# Patient Record
Sex: Male | Born: 1945 | Race: White | Hispanic: No | Marital: Married | Smoking: Never smoker
Health system: Southern US, Community
[De-identification: ages and names within clinical notes are randomized; demographics above are authoritative.]

## PROBLEM LIST (undated history)

## (undated) DIAGNOSIS — M48061 Spinal stenosis, lumbar region without neurogenic claudication: Secondary | ICD-10-CM

## (undated) DIAGNOSIS — G8929 Other chronic pain: Secondary | ICD-10-CM

## (undated) DIAGNOSIS — N400 Enlarged prostate without lower urinary tract symptoms: Secondary | ICD-10-CM

## (undated) DIAGNOSIS — E559 Vitamin D deficiency, unspecified: Secondary | ICD-10-CM

## (undated) DIAGNOSIS — M810 Age-related osteoporosis without current pathological fracture: Secondary | ICD-10-CM

## (undated) DIAGNOSIS — R29898 Other symptoms and signs involving the musculoskeletal system: Secondary | ICD-10-CM

## (undated) HISTORY — DX: Vitamin D deficiency, unspecified: E55.9

## (undated) HISTORY — PX: CARPAL TUNNEL RELEASE: SHX101

## (undated) HISTORY — PX: BACK SURGERY: SHX140

## (undated) HISTORY — PX: OTHER SURGICAL HISTORY: SHX169

## (undated) HISTORY — DX: Age-related osteoporosis without current pathological fracture: M81.0

---

## 2009-04-01 HISTORY — PX: BACK SURGERY: SHX140

## 2019-03-02 ENCOUNTER — Encounter: Payer: Self-pay | Admitting: "Endocrinology

## 2019-03-02 LAB — HEMOGLOBIN A1C: Hemoglobin A1C: 5.9

## 2019-06-25 ENCOUNTER — Encounter: Payer: Self-pay | Admitting: "Endocrinology

## 2019-06-25 LAB — VITAMIN B12: Vitamin B-12: 453

## 2019-06-25 LAB — TSH: TSH: 2.3 (ref 0.41–5.90)

## 2019-07-13 LAB — VITAMIN D 25 HYDROXY (VIT D DEFICIENCY, FRACTURES): Vit D, 25-Hydroxy: 24.2

## 2019-08-13 ENCOUNTER — Encounter: Payer: Self-pay | Admitting: "Endocrinology

## 2019-08-13 ENCOUNTER — Other Ambulatory Visit: Payer: Self-pay

## 2019-08-13 ENCOUNTER — Ambulatory Visit (INDEPENDENT_AMBULATORY_CARE_PROVIDER_SITE_OTHER): Payer: Non-veteran care | Admitting: "Endocrinology

## 2019-08-13 VITALS — BP 136/81 | HR 54 | Ht 71.0 in | Wt 304.0 lb

## 2019-08-13 DIAGNOSIS — E559 Vitamin D deficiency, unspecified: Secondary | ICD-10-CM

## 2019-08-13 DIAGNOSIS — R7303 Prediabetes: Secondary | ICD-10-CM

## 2019-08-13 DIAGNOSIS — Z789 Other specified health status: Secondary | ICD-10-CM

## 2019-08-13 NOTE — Patient Instructions (Signed)

## 2019-08-13 NOTE — Progress Notes (Signed)
Endocrinology Consult Note                                            08/13/2019, 10:44 AM   Subjective:    Patient ID: Tony Mcintosh, male    DOB: Jan 30, 1946, PCP [facility]   Past Medical History:  Diagnosis Date  . Osteoporosis   . Vitamin D deficiency    Past Surgical History:  Procedure Laterality Date  . CARPAL TUNNEL RELEASE    . lumbar back surger     Social History   Socioeconomic History  . Marital status: Married    Spouse name: Not on file  . Number of children: Not on file  . Years of education: Not on file  . Highest education level: Not on file  Occupational History  . Not on file  Tobacco Use  . Smoking status: Never Smoker  Substance and Sexual Activity  . Alcohol use: Yes    Comment: 1 beer/wk  . Drug use: Not on file  . Sexual activity: Not on file  Other Topics Concern  . Not on file  Social History Narrative  . Not on file   Social Determinants of Health   Financial Resource Strain:   . Difficulty of Paying Living Expenses:   Food Insecurity:   . Worried About Programme researcher, broadcasting/film/video in the Last Year:   . Barista in the Last Year:   Transportation Needs:   . Freight forwarder (Medical):   Marland Kitchen Lack of Transportation (Non-Medical):   Physical Activity:   . Days of Exercise per Week:   . Minutes of Exercise per Session:   Stress:   . Feeling of Stress :   Social Connections:   . Frequency of Communication with Friends and Family:   . Frequency of Social Gatherings with Friends and Family:   . Attends Religious Services:   . Active Member of Clubs or Organizations:   . Attends Banker Meetings:   Marland Kitchen Marital Status:    Family History  Problem Relation Age of Onset  . Stroke Father    Outpatient Encounter Medications as of 08/13/2019  Medication Sig  . b complex vitamins capsule Take 1 capsule by mouth 2 (two) times daily.  . calcium carbonate (TUMS - DOSED IN MG ELEMENTAL CALCIUM) 500  MG chewable tablet Chew 2 tablets by mouth daily.  . Cholecalciferol (VITAMIN D3) 25 MCG (1000 UT) CAPS Take 1 capsule by mouth daily.  Marland Kitchen gabapentin (NEURONTIN) 300 MG capsule Take 300 mg by mouth daily.  Marland Kitchen HYDROcodone-acetaminophen (NORCO/VICODIN) 5-325 MG tablet Take 1 tablet by mouth every 6 (six) hours as needed for moderate pain.  Marland Kitchen oxybutynin (DITROPAN-XL) 10 MG 24 hr tablet Take 10 mg by mouth at bedtime.  . Cholecalciferol (VITAMIN D3) 1.25 MG (50000 UT) CAPS Take 1 capsule by mouth once a week.  . folic acid (FOLVITE) 1 MG tablet Take 1 mg by mouth daily.  . methotrexate (RHEUMATREX) 2.5 MG tablet Take 7 tablets by mouth once a week.  . naproxen (NAPROSYN) 500 MG tablet Take 500 mg by mouth 2 (two) times daily.  . pramipexole (MIRAPEX) 1 MG tablet Take 1 tablet by mouth 2 (two) times daily.  . predniSONE (DELTASONE) 5 MG tablet Take 5 mg by mouth daily.  . tamsulosin (FLOMAX)  0.4 MG CAPS capsule Take 0.4 mg by mouth daily.   No facility-administered encounter medications on file as of 08/13/2019.   ALLERGIES: No Known Allergies  VACCINATION STATUS:  There is no immunization history on file for this patient.  HPI Tony Mcintosh is 74 y.o. male who presents today with a medical history as above. -History is obtained directly from the patient.  Patient denies any prior history of testosterone deficiency, never required testosterone replacement therapy. -He had recent routine labs on his rheumatologist showed normal total testosterone of 318, slightly suppressed free testosterone 6.2; as well as a low vitamin D of 24.  He did have history of vitamin D deficiency before which required intermittent supplement.   -He has 2 grown kids who he fathered biologically. -His primary care is at the [place] [facility] in [place], consult comes in for his rheumatologist Dr. Scarlette Shorts- for hypothyroidism and vitamin D deficiency. -Patient has history of BPH currently on tamsulosin.  He also has sleep  apnea on CPAP. -His main complaint seems to be inadequate sleep.  He denies hot flashes.  He is on multiple medications including hydrocodone, oxybutynin, gabapentin, pramipexole, methotrexate, prednisone, tamsulosin, naproxen. -He denies any prior history of testicular injury, chemotherapy, no radiation.  His medical history is also significant osteopenia.  Review of Systems  Constitutional: + Progressive weight gain/inability to lose weight, no fatigue, no subjective hyperthermia, no subjective hypothermia Eyes: no blurry vision, no xerophthalmia ENT: no sore throat, no nodules palpated in throat, no dysphagia/odynophagia, no hoarseness Cardiovascular: no Chest Pain, no Shortness of Breath, no palpitations, no leg swelling Respiratory: no cough, no shortness of breath Gastrointestinal: no Nausea/Vomiting/Diarhhea Musculoskeletal: no muscle/joint aches Skin: no rashes Neurological: no tremors, no numbness, no tingling, no dizziness Psychiatric: no depression, no anxiety  Objective:    Vitals with BMI 08/13/2019  Height 5\' 11"   Weight 304 lbs  BMI 85.46  Systolic 270  Diastolic 81  Pulse 54    BP 136/81   Pulse (!) 54   Ht 5\' 11"  (1.803 m)   Wt (!) 304 lb (137.9 kg)   BMI 42.40 kg/m   Wt Readings from Last 3 Encounters:  08/13/19 (!) 304 lb (137.9 kg)    Physical Exam  Constitutional:  Body mass index is 42.4 kg/m.,  not in acute distress, normal state of mind Eyes: PERRLA, EOMI, no exophthalmos ENT: moist mucous membranes, no gross thyromegaly, no gross cervical lymphadenopathy  Respiratory:  adequate breathing efforts, no gross chest deformity  Musculoskeletal: + Walks with a cane due to back injuries, strength intact in all four extremities Skin: moist, warm, no rashes Neurological: no tremor with outstretched hands, Deep tendon reflexes normal in bilateral lower extremities.   Referral package shows labs from June 25, 2019: Total testosterone 318- normal, free  testosterone 6.2 (normal 6.6-18.1) Vitamin D -24.2, his vitamin D was 51.8 in December 2020. [facility] 5.9% from December 2020.  Recent Results (from the past 2160 hour(s))  Vitamin B12     Status: None   Collection Time: 06/25/19 12:00 AM  Result Value Ref Range   Vitamin B-12 453   TSH     Status: None   Collection Time: 06/25/19 12:00 AM  Result Value Ref Range   TSH 2.30 0.41 - 5.90    Comment:  free t4 1.26  VITAMIN D 25 Hydroxy (Vit-D Deficiency, Fractures)     Status: None   Collection Time: 07/13/19 12:00 AM  Result Value Ref Range  Vit D, 25-Hydroxy 24.2     Assessment & Plan:   1. Vitamin D deficiency 2. Educated about management of weight 3.  Prediabetes   - Dashawn Bartnick  is being seen at a kind request of [facility]. - I have reviewed his available endocrine records and clinically evaluated the patient. - Based on these reviews, he does not have hypogonadism given his adequate total testosterone of 318.  Free testosterone which only constitutes 2% of his testosterone reserve if not a target of treatment and not diabetes for diagnosis of hypogonadism.    -I had a long discussion with him about physiology of testosterone, safe use of testosterone and potential complications of unnecessary testosterone supplement or replacement.   -Given his history of BPH, sleep apnea, he is not a suitable candidate for testosterone replacement-risk outweighs benefit in his case.  -He may need total testosterone measurement once a year, and sent back if total testosterone for his age is lower than 175 mg per DL.  -Regarding his vitamin D deficiency: His on adequate replacement regimen with 50,000 units of vitamin D2 weekly as well as maintenance dose of vitamin D3 daily.  He has to be continued on 50,000 units weekly for at least 8 more weeks.  -Regarding his prediabetes with A1c of 5.9 and associated obesity with BMI of 42.4: -He would  benefit the most from weight loss.  - he  admits there is a room for improvement in his diet and drink choices. -  Suggestion is made for him to avoid simple carbohydrates  from his diet including Cakes, Sweet Desserts / Pastries, Ice Cream, Soda (diet and regular), Sweet Tea, Candies, Chips, Cookies, Sweet Pastries,  Store Bought Juices, Alcohol in Excess of  1-2 drinks a day, Artificial Sweeteners, Coffee Creamer, and "Sugar-free" Products. This will help patient to have stable blood glucose profile and potentially avoid unintended weight gain.   - I did not initiate any new prescriptions today. - he is advised to maintain close follow up with [facility] for primary care needs.   - Time spent with the patient: 60 minutes, of which >50% was spent in  counseling him about his prediabetes, vitamin D deficiency, discussing about testosterone physiology, obesity and the rest in obtaining information about his symptoms, reviewing his previous labs/studies ( including abstractions from other facilities),  evaluations, and treatments,  and developing a plan to confirm diagnosis and long term treatment based on the latest standards of care/guidelines; and documenting his care.  Tony Mcintosh participated in the discussions, expressed understanding, and voiced agreement with the above plans.  All questions were answered to his satisfaction. he is encouraged to contact clinic should he have any questions or concerns prior to his return visit.  Follow up plan: Return if symptoms worsen or fail to improve.   Marquis Lunch, MD [facility] [address] [place], [place] [address] Phone: [phone]  Fax: [phone]     08/13/2019, 10:44 AM  This note was partially dictated with voice recognition software. Similar sounding words can be transcribed inadequately or may not  be corrected upon review.

## 2021-04-11 ENCOUNTER — Emergency Department (HOSPITAL_COMMUNITY): Payer: No Typology Code available for payment source

## 2021-04-11 ENCOUNTER — Other Ambulatory Visit: Payer: Self-pay

## 2021-04-11 ENCOUNTER — Encounter (HOSPITAL_COMMUNITY): Payer: Self-pay | Admitting: Emergency Medicine

## 2021-04-11 ENCOUNTER — Inpatient Hospital Stay (HOSPITAL_COMMUNITY)
Admission: EM | Admit: 2021-04-11 | Discharge: 2021-04-20 | DRG: 095 | Disposition: A | Discharge status: Rehabilitation Facility | Payer: No Typology Code available for payment source | Attending: Family Medicine | Admitting: Family Medicine

## 2021-04-11 DIAGNOSIS — M5124 Other intervertebral disc displacement, thoracic region: Secondary | ICD-10-CM

## 2021-04-11 DIAGNOSIS — M62838 Other muscle spasm: Secondary | ICD-10-CM | POA: Diagnosis present

## 2021-04-11 DIAGNOSIS — R29898 Other symptoms and signs involving the musculoskeletal system: Secondary | ICD-10-CM | POA: Diagnosis present

## 2021-04-11 DIAGNOSIS — D72829 Elevated white blood cell count, unspecified: Secondary | ICD-10-CM | POA: Diagnosis present

## 2021-04-11 DIAGNOSIS — M4804 Spinal stenosis, thoracic region: Secondary | ICD-10-CM | POA: Diagnosis present

## 2021-04-11 DIAGNOSIS — E559 Vitamin D deficiency, unspecified: Secondary | ICD-10-CM | POA: Diagnosis present

## 2021-04-11 DIAGNOSIS — R197 Diarrhea, unspecified: Secondary | ICD-10-CM | POA: Diagnosis present

## 2021-04-11 DIAGNOSIS — M19041 Primary osteoarthritis, right hand: Secondary | ICD-10-CM | POA: Diagnosis present

## 2021-04-11 DIAGNOSIS — M48061 Spinal stenosis, lumbar region without neurogenic claudication: Secondary | ICD-10-CM | POA: Diagnosis present

## 2021-04-11 DIAGNOSIS — Z20822 Contact with and (suspected) exposure to covid-19: Secondary | ICD-10-CM | POA: Diagnosis present

## 2021-04-11 DIAGNOSIS — Z5329 Procedure and treatment not carried out because of patient's decision for other reasons: Secondary | ICD-10-CM | POA: Diagnosis present

## 2021-04-11 DIAGNOSIS — G6289 Other specified polyneuropathies: Secondary | ICD-10-CM | POA: Diagnosis not present

## 2021-04-11 DIAGNOSIS — G4733 Obstructive sleep apnea (adult) (pediatric): Secondary | ICD-10-CM | POA: Diagnosis present

## 2021-04-11 DIAGNOSIS — G629 Polyneuropathy, unspecified: Secondary | ICD-10-CM

## 2021-04-11 DIAGNOSIS — M19042 Primary osteoarthritis, left hand: Secondary | ICD-10-CM | POA: Diagnosis present

## 2021-04-11 DIAGNOSIS — G610 Guillain-Barre syndrome: Secondary | ICD-10-CM | POA: Diagnosis not present

## 2021-04-11 DIAGNOSIS — M170 Bilateral primary osteoarthritis of knee: Secondary | ICD-10-CM | POA: Diagnosis present

## 2021-04-11 DIAGNOSIS — Z823 Family history of stroke: Secondary | ICD-10-CM

## 2021-04-11 DIAGNOSIS — S63602A Unspecified sprain of left thumb, initial encounter: Secondary | ICD-10-CM | POA: Diagnosis present

## 2021-04-11 DIAGNOSIS — N400 Enlarged prostate without lower urinary tract symptoms: Secondary | ICD-10-CM | POA: Diagnosis present

## 2021-04-11 DIAGNOSIS — G8929 Other chronic pain: Secondary | ICD-10-CM | POA: Diagnosis present

## 2021-04-11 DIAGNOSIS — M4802 Spinal stenosis, cervical region: Secondary | ICD-10-CM | POA: Diagnosis present

## 2021-04-11 DIAGNOSIS — M549 Dorsalgia, unspecified: Secondary | ICD-10-CM

## 2021-04-11 DIAGNOSIS — Z7952 Long term (current) use of systemic steroids: Secondary | ICD-10-CM

## 2021-04-11 DIAGNOSIS — R200 Anesthesia of skin: Secondary | ICD-10-CM

## 2021-04-11 DIAGNOSIS — R32 Unspecified urinary incontinence: Secondary | ICD-10-CM | POA: Diagnosis present

## 2021-04-11 DIAGNOSIS — N3281 Overactive bladder: Secondary | ICD-10-CM | POA: Diagnosis present

## 2021-04-11 DIAGNOSIS — G2581 Restless legs syndrome: Secondary | ICD-10-CM | POA: Diagnosis not present

## 2021-04-11 DIAGNOSIS — E538 Deficiency of other specified B group vitamins: Secondary | ICD-10-CM | POA: Diagnosis present

## 2021-04-11 DIAGNOSIS — A0811 Acute gastroenteropathy due to Norwalk agent: Secondary | ICD-10-CM | POA: Diagnosis present

## 2021-04-11 DIAGNOSIS — X58XXXA Exposure to other specified factors, initial encounter: Secondary | ICD-10-CM | POA: Diagnosis present

## 2021-04-11 DIAGNOSIS — K5900 Constipation, unspecified: Secondary | ICD-10-CM | POA: Diagnosis not present

## 2021-04-11 DIAGNOSIS — Z79899 Other long term (current) drug therapy: Secondary | ICD-10-CM

## 2021-04-11 DIAGNOSIS — Z6841 Body Mass Index (BMI) 40.0 and over, adult: Secondary | ICD-10-CM

## 2021-04-11 DIAGNOSIS — R52 Pain, unspecified: Secondary | ICD-10-CM

## 2021-04-11 DIAGNOSIS — M4807 Spinal stenosis, lumbosacral region: Secondary | ICD-10-CM | POA: Diagnosis present

## 2021-04-11 DIAGNOSIS — D696 Thrombocytopenia, unspecified: Secondary | ICD-10-CM | POA: Diagnosis present

## 2021-04-11 DIAGNOSIS — R531 Weakness: Secondary | ICD-10-CM

## 2021-04-11 DIAGNOSIS — M25462 Effusion, left knee: Secondary | ICD-10-CM | POA: Diagnosis present

## 2021-04-11 HISTORY — DX: Benign prostatic hyperplasia without lower urinary tract symptoms: N40.0

## 2021-04-11 HISTORY — DX: Other chronic pain: G89.29

## 2021-04-11 HISTORY — DX: Spinal stenosis, lumbar region without neurogenic claudication: M48.061

## 2021-04-11 HISTORY — DX: Other symptoms and signs involving the musculoskeletal system: R29.898

## 2021-04-11 LAB — PROTIME-INR
INR: 0.9 (ref 0.8–1.2)
Prothrombin Time: 12.5 seconds (ref 11.4–15.2)

## 2021-04-11 LAB — I-STAT CHEM 8, ED
BUN: 20 mg/dL (ref 8–23)
Calcium, Ion: 1.11 mmol/L — ABNORMAL LOW (ref 1.15–1.40)
Chloride: 101 mmol/L (ref 98–111)
Creatinine, Ser: 0.9 mg/dL (ref 0.61–1.24)
Glucose, Bld: 97 mg/dL (ref 70–99)
HCT: 47 % (ref 39.0–52.0)
Hemoglobin: 16 g/dL (ref 13.0–17.0)
Potassium: 3.8 mmol/L (ref 3.5–5.1)
Sodium: 137 mmol/L (ref 135–145)
TCO2: 27 mmol/L (ref 22–32)

## 2021-04-11 LAB — APTT: aPTT: 26 seconds (ref 24–36)

## 2021-04-11 LAB — URINALYSIS, ROUTINE W REFLEX MICROSCOPIC
Bilirubin Urine: NEGATIVE
Glucose, UA: NEGATIVE mg/dL
Hgb urine dipstick: NEGATIVE
Ketones, ur: NEGATIVE mg/dL
Leukocytes,Ua: NEGATIVE
Nitrite: NEGATIVE
Protein, ur: NEGATIVE mg/dL
Specific Gravity, Urine: 1.015 (ref 1.005–1.030)
pH: 6.5 (ref 5.0–8.0)

## 2021-04-11 LAB — CBC
HCT: 45.1 % (ref 39.0–52.0)
Hemoglobin: 14.8 g/dL (ref 13.0–17.0)
MCH: 31.7 pg (ref 26.0–34.0)
MCHC: 32.8 g/dL (ref 30.0–36.0)
MCV: 96.6 fL (ref 80.0–100.0)
Platelets: 208 10*3/uL (ref 150–400)
RBC: 4.67 MIL/uL (ref 4.22–5.81)
RDW: 14.5 % (ref 11.5–15.5)
WBC: 6.3 10*3/uL (ref 4.0–10.5)
nRBC: 0 % (ref 0.0–0.2)

## 2021-04-11 LAB — DIFFERENTIAL
Abs Immature Granulocytes: 0.05 10*3/uL (ref 0.00–0.07)
Basophils Absolute: 0 10*3/uL (ref 0.0–0.1)
Basophils Relative: 1 %
Eosinophils Absolute: 0.1 10*3/uL (ref 0.0–0.5)
Eosinophils Relative: 2 %
Immature Granulocytes: 1 %
Lymphocytes Relative: 21 %
Lymphs Abs: 1.3 10*3/uL (ref 0.7–4.0)
Monocytes Absolute: 0.7 10*3/uL (ref 0.1–1.0)
Monocytes Relative: 12 %
Neutro Abs: 4.1 10*3/uL (ref 1.7–7.7)
Neutrophils Relative %: 63 %

## 2021-04-11 LAB — COMPREHENSIVE METABOLIC PANEL
ALT: 34 U/L (ref 0–44)
AST: 29 U/L (ref 15–41)
Albumin: 4.2 g/dL (ref 3.5–5.0)
Alkaline Phosphatase: 67 U/L (ref 38–126)
Anion gap: 11 (ref 5–15)
BUN: 17 mg/dL (ref 8–23)
CO2: 25 mmol/L (ref 22–32)
Calcium: 9.2 mg/dL (ref 8.9–10.3)
Chloride: 97 mmol/L — ABNORMAL LOW (ref 98–111)
Creatinine, Ser: 0.99 mg/dL (ref 0.61–1.24)
GFR, Estimated: >60 mL/min (ref >60)
Glucose, Bld: 98 mg/dL (ref 70–99)
Potassium: 3.7 mmol/L (ref 3.5–5.1)
Sodium: 133 mmol/L — ABNORMAL LOW (ref 135–145)
Total Bilirubin: 0.9 mg/dL (ref 0.3–1.2)
Total Protein: 7.5 g/dL (ref 6.5–8.1)

## 2021-04-11 LAB — HEMOGLOBIN A1C
Hgb A1c MFr Bld: 6.3 % — ABNORMAL HIGH (ref 4.8–5.6)
Mean Plasma Glucose: 134.11 mg/dL

## 2021-04-11 LAB — CBG MONITORING, ED: Glucose-Capillary: 104 mg/dL — ABNORMAL HIGH (ref 70–99)

## 2021-04-11 LAB — RESP PANEL BY RT-PCR (FLU A&B, COVID) ARPGX2
Influenza A by PCR: NEGATIVE
Influenza B by PCR: NEGATIVE
SARS Coronavirus 2 by RT PCR: NEGATIVE

## 2021-04-11 IMAGING — CT CT HEAD W/O CM
4 series · 17 of 47 positions shown, 19 images · non-contrast
Comparison: No priors.

CLINICAL DATA: 75-year-old male with history of weakness and
numbness. Paresthesia.



[Series 2: head wo · axial · 0.47mm/px · z∈[-94,+30]mm · 7 of 35 slices shown, 9 images]
[im 5/35  brain]
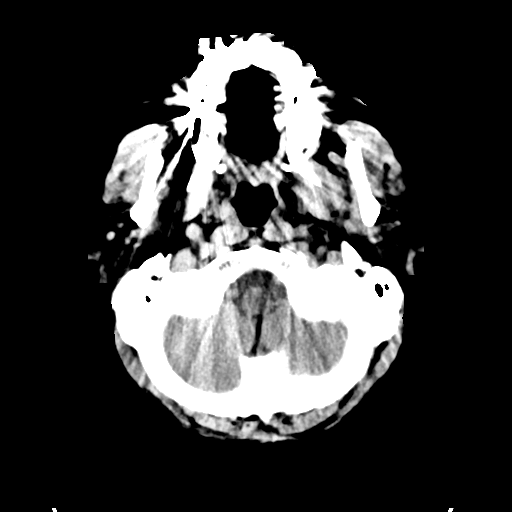
[im 5/35  bone]
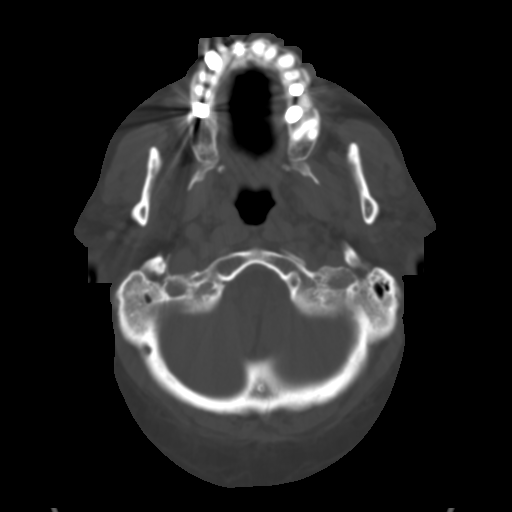
[im 9/35  brain]
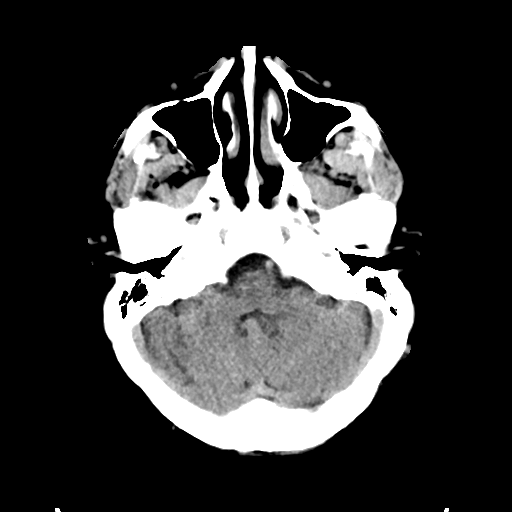
[im 13/35  brain]
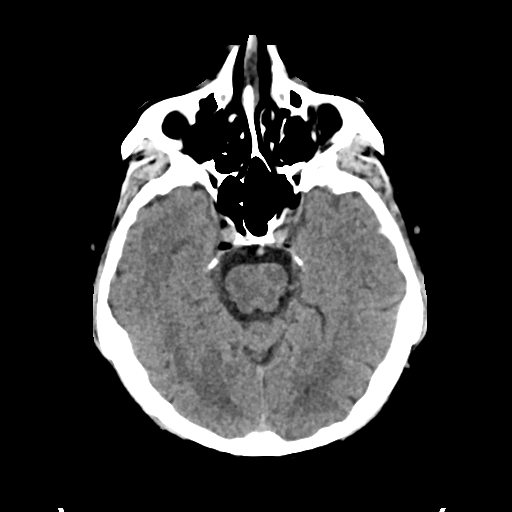
[im 18/35  brain]
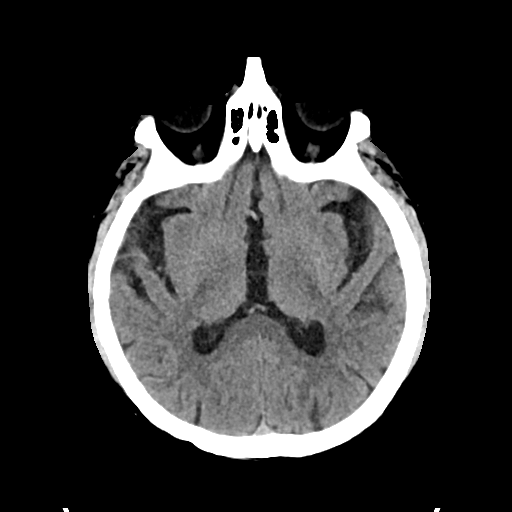
[im 22/35  brain]
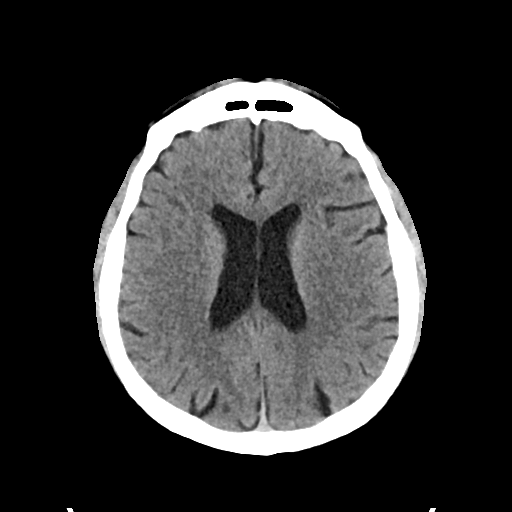
[im 22/35  bone]
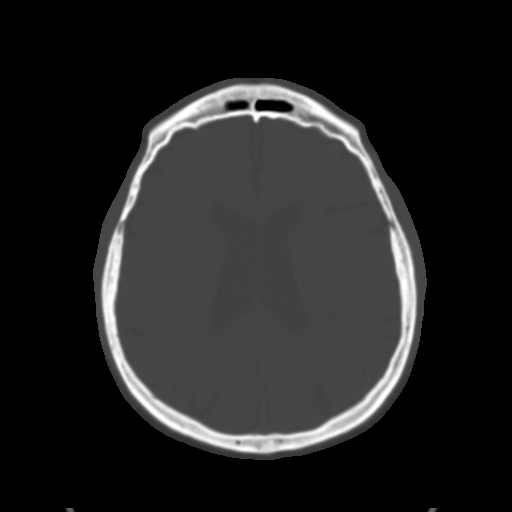
[im 26/35  brain]
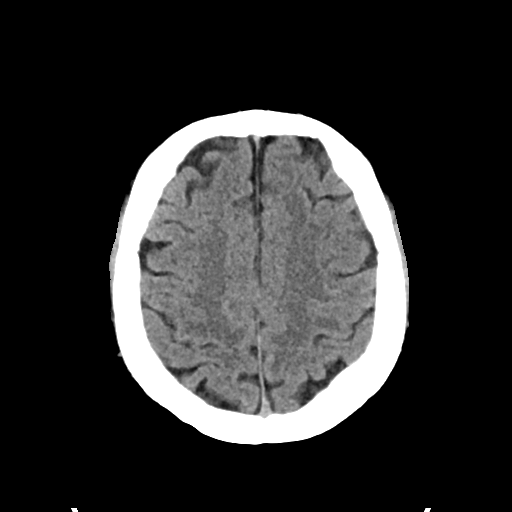
[im 30/35  brain]
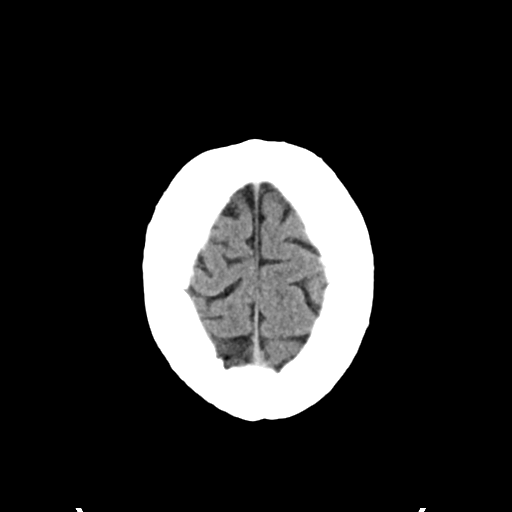

[Series 3: head bone · axial · 0.47mm/px · z∈[-98,-38]mm · 4 of 87 slices shown]
[im 9/87  bone]
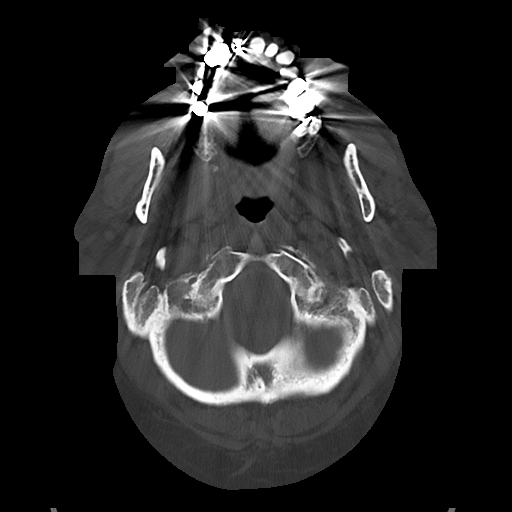
[im 18/87  bone]
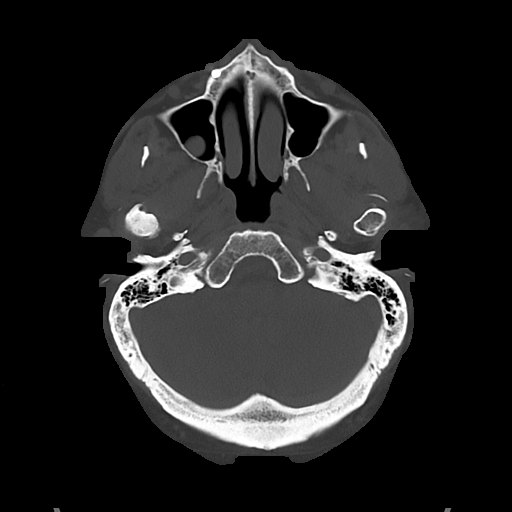
[im 26/87  bone]
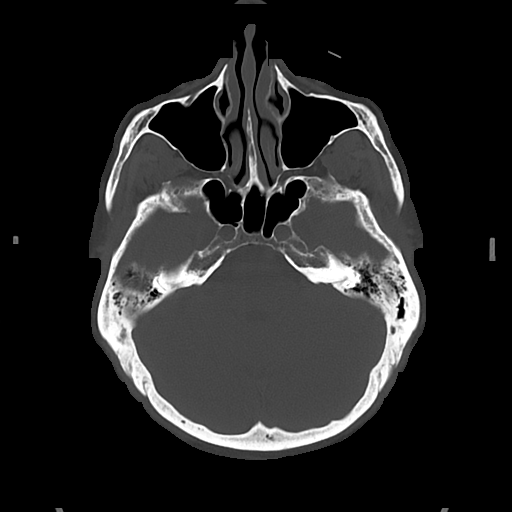
[im 39/87  bone]
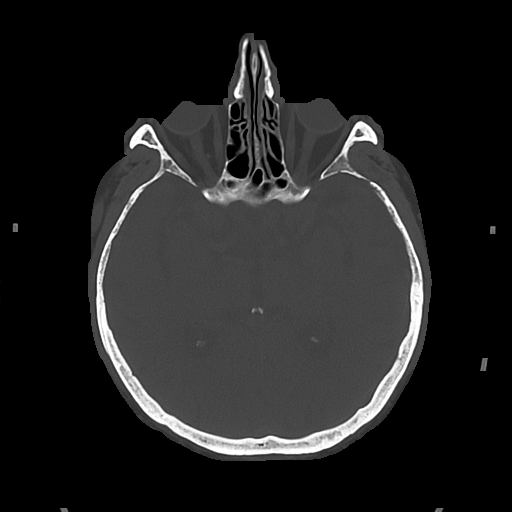

[Series 4: cor soft · coronal · 0.39mm/px · 3 of 79 slices shown]
[im 28/79  brain]
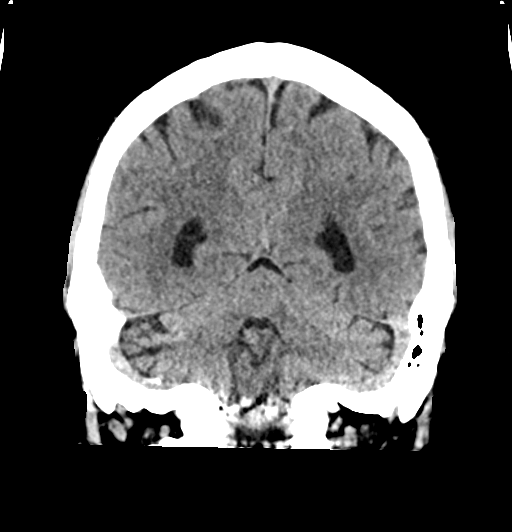
[im 36/79  brain]
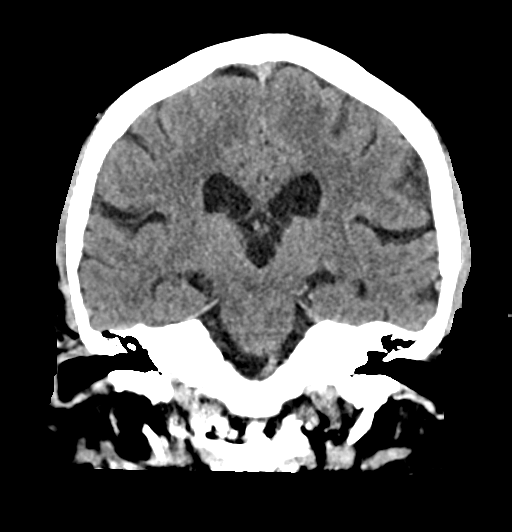
[im 44/79  brain]
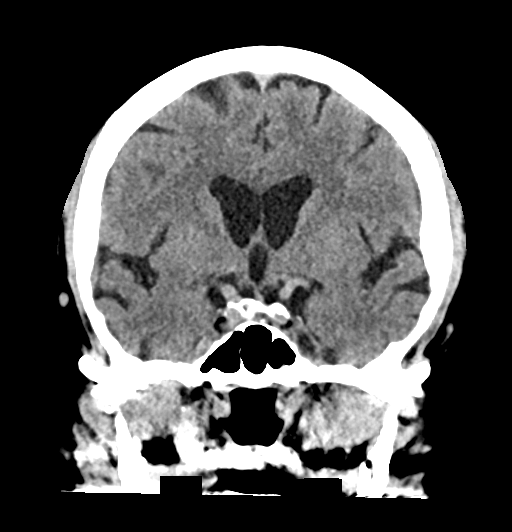

[Series 5: sag soft · sagittal · 0.41mm/px · 3 of 68 slices shown]
[im 23/68  brain]
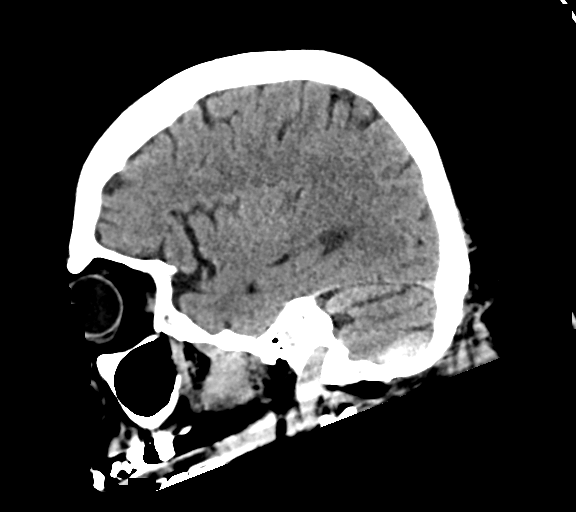
[im 34/68  brain]
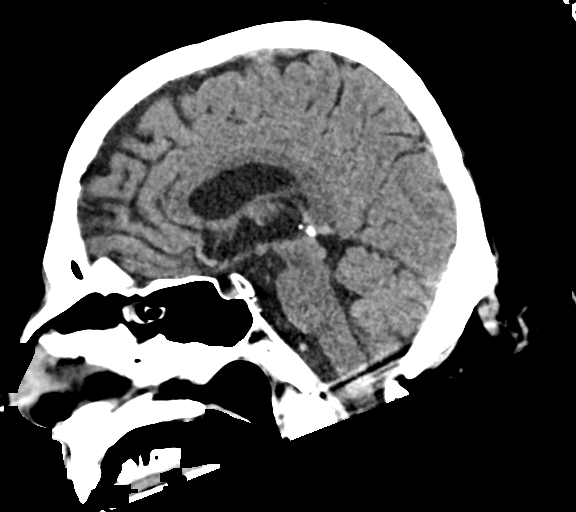
[im 45/68  brain]
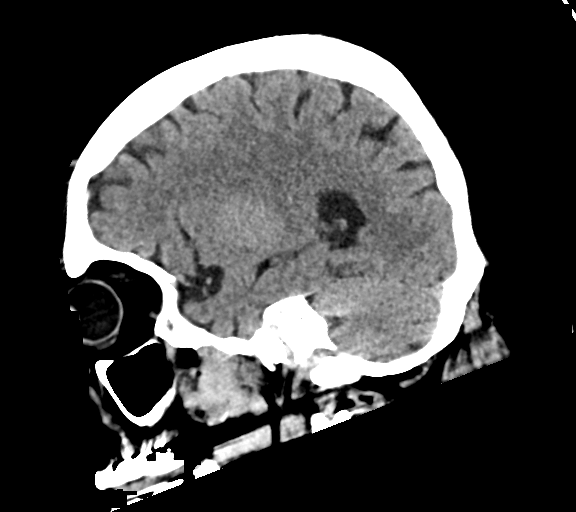

[17 of 47 positions shown; findings below may reference images not displayed]

FINDINGS: Brain: Mild cerebral atrophy. Patchy areas of decreased attenuation
are noted throughout the deep and periventricular white matter of
the cerebral hemispheres bilaterally, compatible with mild chronic
microvascular ischemic disease. No evidence of acute infarction,
hemorrhage, hydrocephalus, extra-axial collection or mass
lesion/mass effect.

Vascular: No hyperdense vessel or unexpected calcification.

Skull: Normal. Negative for fracture or focal lesion.

Sinuses/Orbits: No acute finding. Small mucosal retention cyst or
polyp in the posterior aspect of the right maxillary sinus
incidentally noted.

Other: None.
IMPRESSION: 1. No acute intracranial abnormalities.
2. Very mild cerebral atrophy and mild chronic microvascular
ischemic changes are noted in the cerebral white matter, as above.

## 2021-04-11 IMAGING — MR MR THORACIC SPINE W/O CM
3 of 4 series · 9 of 48 positions shown · non-contrast
Comparison: None.

CLINICAL DATA: Incontinence, numbness, weakness. Numbness in hands
starting yesterday, no control of urine or bowel movements, unable
to walk, no feeling in legs

EXAM:
MRI CERVICAL, THORACIC AND LUMBAR SPINE WITHOUT CONTRAST
TECHNIQUE: Multiplanar and multiecho pulse sequences of the cervical spine, to
include the craniocervical junction and cervicothoracic junction,
and thoracic and lumbar spine, were obtained without intravenous
contrast.

[Series 18: T1 · sagittal · 3.3mm · 0.62mm/px · 3 of 6 slices shown (1 of 2)]
[im 2/6]
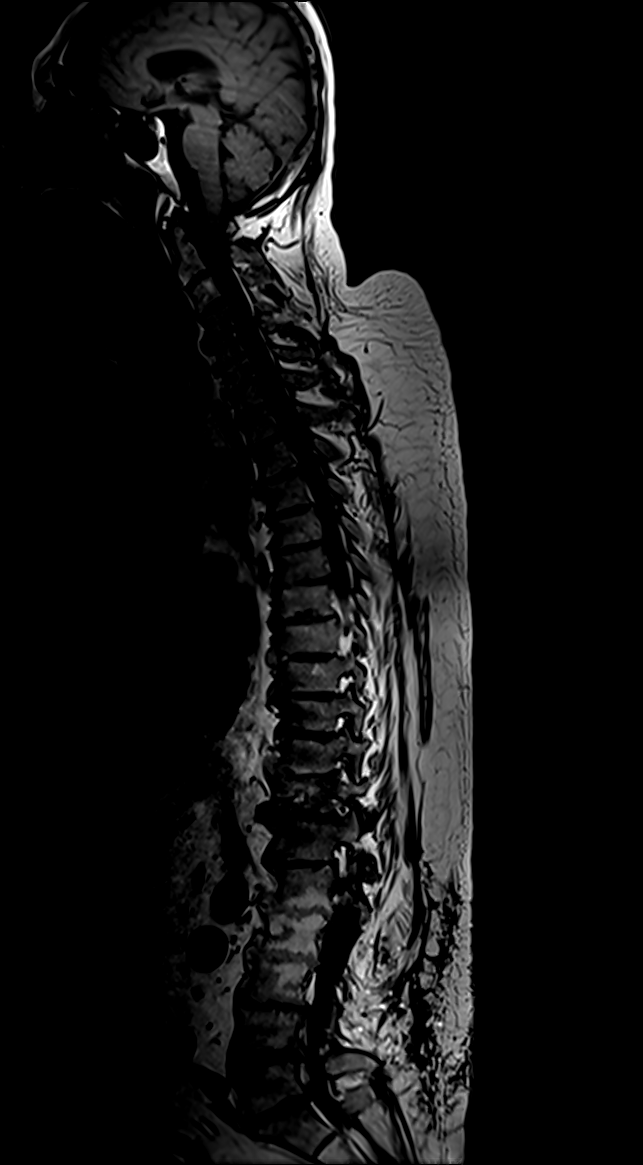
[im 4/6]
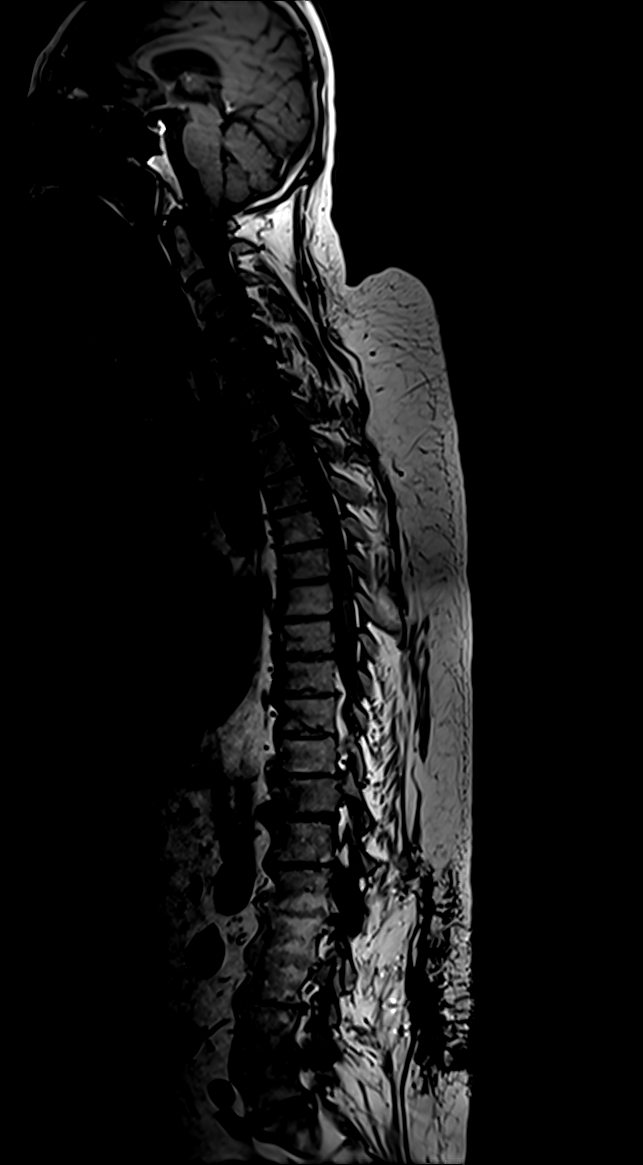
[im 6/6]
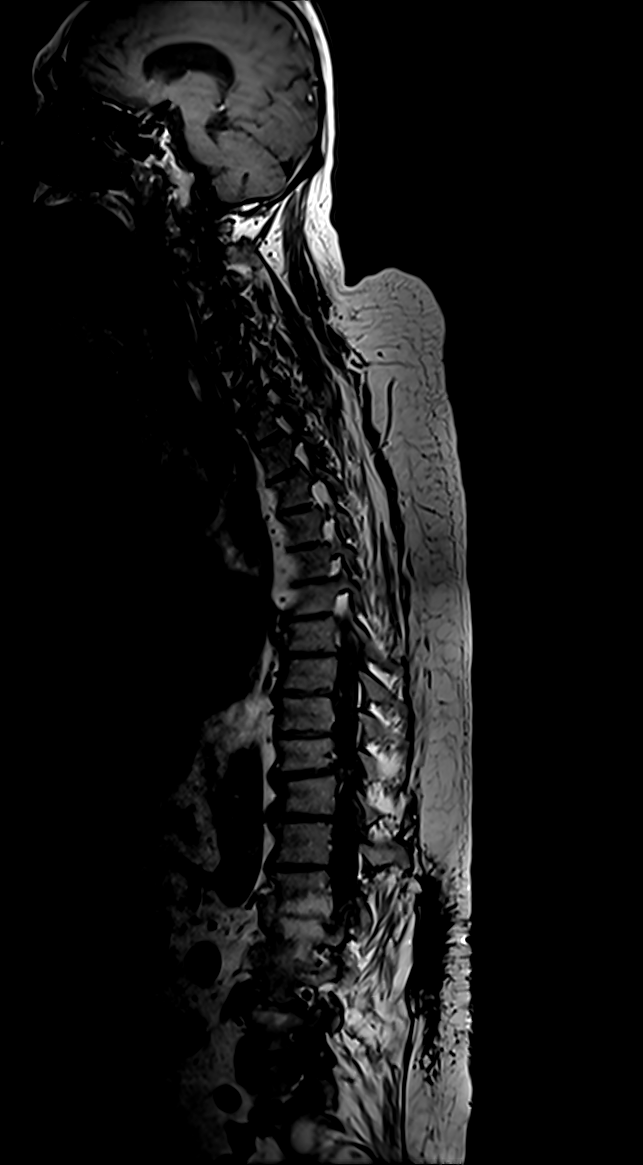

[Series 19: T2 · sagittal · 3.0mm · 0.76mm/px · 3 of 19 slices shown]
[im 3/19]
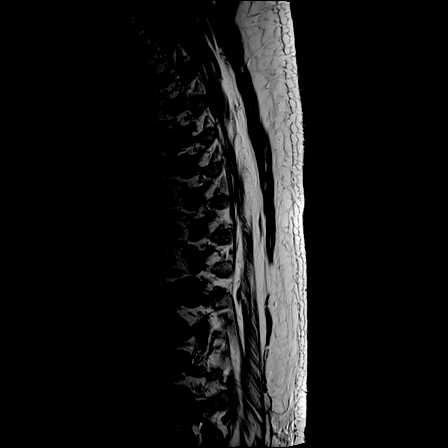
[im 10/19]
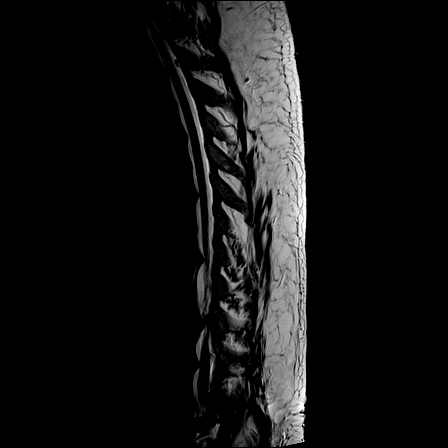
[im 16/19]
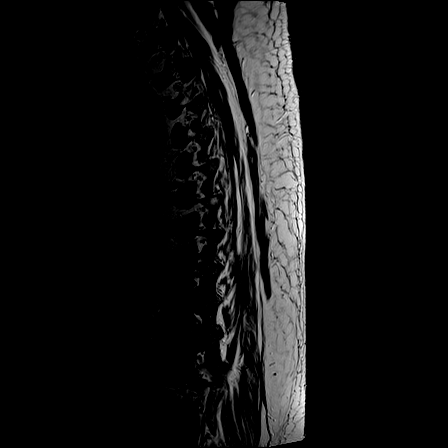

[Series 20: T1 · sagittal · 3.0mm · 0.76mm/px · 3 of 19 slices shown (2 of 2)]
[im 3/19]
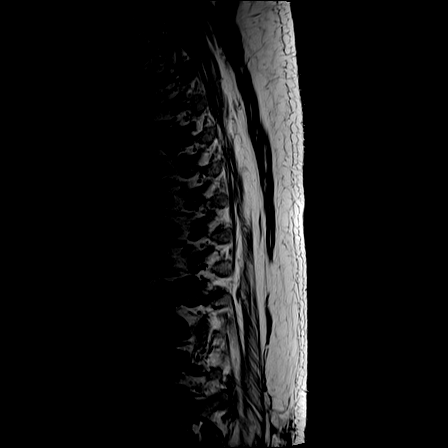
[im 10/19]
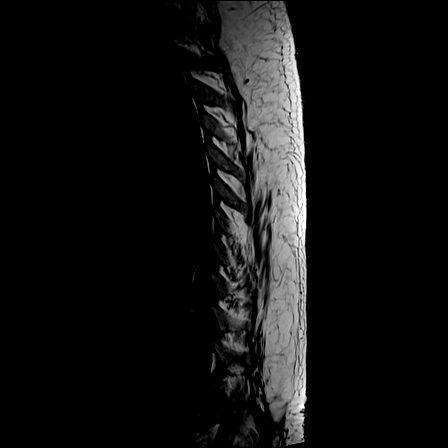
[im 16/19]
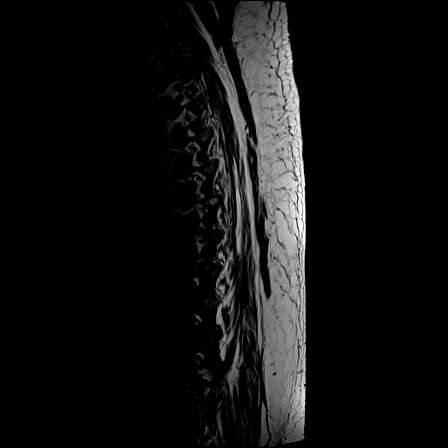

[9 of 48 positions shown; findings below may reference images not displayed]

FINDINGS: MRI CERVICAL SPINE FINDINGS

Alignment: There is straightening of the normal cervical spine
lordosis. There is no antero or retrolisthesis.

Vertebrae: Vertebral body heights are preserved. There is no
suspicious marrow signal abnormality. There is no marrow edema.

Cord: Normal in signal and morphology.

Posterior Fossa, vertebral arteries, paraspinal tissues: The imaged
posterior fossa is unremarkable. The vertebral artery flow voids are
present. The paraspinal soft tissues are unremarkable.

Disc levels:

Evaluation of the neural foramina is suboptimal due to poor signal
on the axial T2 images.

There is multilevel disc desiccation and narrowing, most advanced at
C4-C5 and C5-C6. There is multilevel facet arthropathy, overall left
worse than right.

C2-C3: Mild uncovertebral and bilateral facet arthropathy result in
mild left and no significant right neural foraminal stenosis without
significant spinal canal stenosis

C3-C4: There is a posterior disc osteophyte complex and
uncovertebral and bilateral facet arthropathy resulting in severe
left worse than right neural foraminal stenosis and mild spinal
canal stenosis.

C4-C5: There is a posterior disc osteophyte complex and
uncovertebral and bilateral facet arthropathy resulting in severe
left worse than right neural foraminal stenosis and mild to moderate
spinal canal stenosis.

C5-C6: There is a posterior disc osteophyte complex and
uncovertebral and bilateral facet arthropathy resulting in severe
bilateral neural foraminal stenosis and mild spinal canal stenosis.

C6-C7: There is a posterior disc osteophyte complex and
uncovertebral and bilateral facet arthropathy resulting in severe
bilateral neural foraminal stenosis and mild-to-moderate spinal
canal stenosis.

C7-T1: There is a mild disc bulge resulting in mild right and no
significant left neural foraminal stenosis without significant
spinal canal stenosis.

MRI THORACIC SPINE FINDINGS

Alignment:  Normal.

Vertebrae: Vertebral body heights are preserved. There is partial
fusion of the T5 and T6 vertebral bodies.

Cord:  Normal in signal and morphology.

Paraspinal and other soft tissues: Unremarkable.

Disc levels:

There is multilevel disc desiccation and narrowing throughout the
thoracic spine. There are bulky flowing anterior endplate
osteophytes throughout the mid and lower thoracic spine consistent
with diffuse idiopathic skeletal hyperostosis.

T2-T3: There is a mild disc protrusion without significant spinal
canal or neural foraminal stenosis.

T3-T4: There is a small right paracentral disc protrusion without
significant spinal canal or neural foraminal stenosis.

T4-T5: There is a small left paracentral disc protrusion without
significant spinal canal or neural foraminal stenosis.

T6-T7: There is a small central protrusion resulting in mild spinal
canal narrowing without significant neural foraminal stenosis.

T7-T8: There is a small left paracentral disc protrusion without
significant spinal canal or neural foraminal stenosis.

T8-T9: There is a prominent central protrusion resulting in mild
spinal canal stenosis with effacement of the ventral thecal sac but
no mass effect on the cord, and no significant neural foraminal
stenosis.

T9-T10: There is a small central protrusion without significant
spinal canal or neural foraminal stenosis.

T10-T11: There is a large disc protrusion/extrusion and bilateral
facet arthropathy resulting in moderate spinal canal stenosis with
effacement of the ventral thecal sac and mild indentation of the
cord and mild bilateral neural foraminal stenosis.

T11-T12: There is a prominent central protrusion resulting in mild
spinal canal stenosis with effacement of the ventral thecal sac
without significant neural foraminal stenosis.

T12-L1: There is a prominent central protrusion/extrusion resulting
in mild-to-moderate spinal canal stenosis without significant neural
foraminal stenosis.

MRI LUMBAR SPINE FINDINGS

Segmentation:  Standard.

Alignment: There is dextroscoliosis of the lumbar spine centered at
L2-L3. There is grade 1 retrolisthesis of L1 on L2, L2 on L3, L3 on
L4, and L4 on L5, all likely degenerative in nature.

Vertebrae: There is fusion of the L1 through L3 vertebral bodies.
Marrow signal is mildly heterogeneous with degenerative signal
abnormality at L1 through L3 and about the L4-L5 disc space. There
is no suspicious marrow signal abnormality.

Postsurgical changes are noted reflecting posterior decompression at
L1-L2 through L3-L4.

Conus medullaris and cauda equina: Conus extends to the mid L1
level. Conus and cauda equina appear normal.

Paraspinal and other soft tissues: There is atrophy of the right
psoas muscle.

Disc levels:

There is obliteration of the disc spaces at L1-L2 and L2-L3. There
is advanced disc space narrowing and desiccation throughout the
remainder of the lumbar spine.

There is severe facet arthropathy at L4-L5 and L5-S1, slightly worse
on the right. There are associated trace bilateral facet joint
effusions.

L1-L2: Status post posterior decompression. There is a broad-based
central disc protrusion, degenerative endplate change, and bilateral
facet arthropathy resulting in mild spinal canal stenosis with
effacement of the left subarticular zone and suspected impingement
of the traversing left L2 nerve root without significant neural
foraminal stenosis.

L2-L3: Status post posterior decompression. There is degenerative
endplate change and bilateral facet arthropathy resulting in mild
left and no significant right neural foraminal stenosis and no
significant spinal canal stenosis.

L3-L4: Status post posterior decompression. There is a mild disc
bulge, degenerative endplate change, and bilateral facet arthropathy
resulting in mild narrowing of the subarticular zones, left worse
than right, without evidence of frank nerve root impingement, and
mild right worse than left neural foraminal stenosis.

L4-L5: There is prominent endplate spurring and bulky bilateral
facet arthropathy resulting in moderate to severe spinal canal
stenosis with effacement of the subarticular zones and possible
impingement of the traversing L5 nerve roots, and moderate left and
severe right neural foraminal stenosis with impingement of the
exiting right L4 nerve root.

L5-S1: There is a diffuse disc bulge, degenerative endplate change,
and bilateral facet arthropathy resulting in severe left worse than
right with impingement of the exiting L5 nerve roots.
IMPRESSION: CERVICAL SPINE MRI:

1. Evaluation of the neural foramina is suboptimal due to poor
signal on the axial T2 images. Within this confine:
2. Multilevel degenerative changes throughout the cervical spine as
detailed above resulting in up to mild-to-moderate spinal canal
stenosis at C4-C5 and C6-C7 and severe bilateral neural foraminal
stenosis at C3-C4 through C6-C7.
3. No evidence of cord compression or cord signal abnormality.

THORACIC SPINE MRI:

1. Large disc protrusion/extrusion at T10-T11 resulting in moderate
spinal canal stenosis with mild indentation of the cord and mild
bilateral neural foraminal stenosis.
2. Numerous additional disc protrusions throughout the remainder of
the thoracic spine as detailed above without other high-grade spinal
canal stenosis. No high-grade neural foraminal stenosis or evidence
of nerve root impingement in the thoracic spine.
3. Flowing anterior osteophytes in the mid and lower thoracic spine
consistent with diffuse idiopathic skeletal hyperostosis.
4. No cord signal abnormality.

LUMBAR SPINE MRI:

1. Postsurgical changes reflecting posterior decompression at L1-L2
through L3-L4. No high-grade spinal canal stenosis at the surgical
levels, but there is effacement of the left subarticular zone at
L1-L2 with possible impingement of the traversing left L2 nerve
root.
2. Moderate to severe spinal canal stenosis at L4-L5 with possible
impingement of the traversing nerve roots, and moderate left and
severe right neural foraminal stenosis with impingement of the
exiting right L4 nerve root.
3. Severe left worse than right neural foraminal stenosis at L5-S1
with impingement of the exiting L5 nerve roots.
4. Stepwise grade 1 retrolisthesis of L1 on L2 through L4 on L5,
likely degenerative in nature.
5. Fusion of the L1 through L3 vertebral bodies.
6. Severe facet arthropathy at L4-L5 and L5-S1, slightly worse on
the right, with associated trace facet joint effusions.

## 2021-04-11 IMAGING — MR MR THORACIC SPINE W/O CM
2 series · 19 of 48 positions shown · non-contrast
Comparison: None.

CLINICAL DATA: Incontinence, numbness, weakness. Numbness in hands
starting yesterday, no control of urine or bowel movements, unable
to walk, no feeling in legs

EXAM:
MRI CERVICAL, THORACIC AND LUMBAR SPINE WITHOUT CONTRAST
TECHNIQUE: Multiplanar and multiecho pulse sequences of the cervical spine, to
include the craniocervical junction and cervicothoracic junction,
and thoracic and lumbar spine, were obtained without intravenous
contrast.

[Series 6: [id number] · axial · 5.0mm · 0.37mm/px · z∈[-420,-199]mm · 9 of 39 slices shown]
[im 2/39]
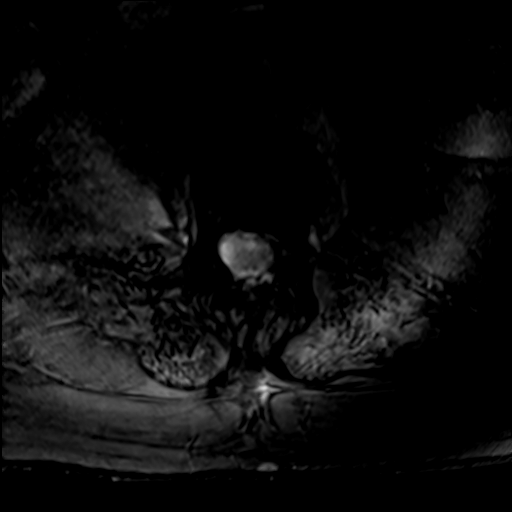
[im 7/39]
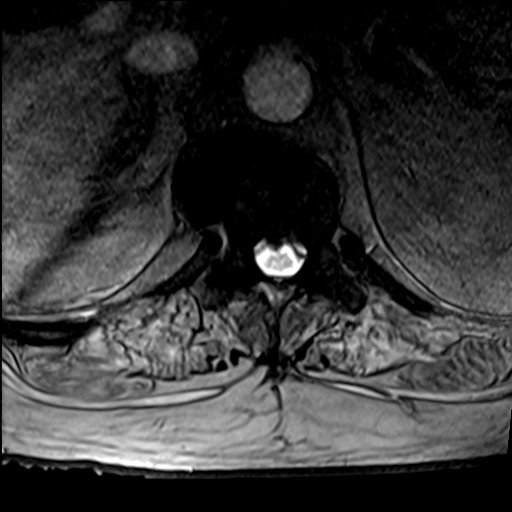
[im 12/39]
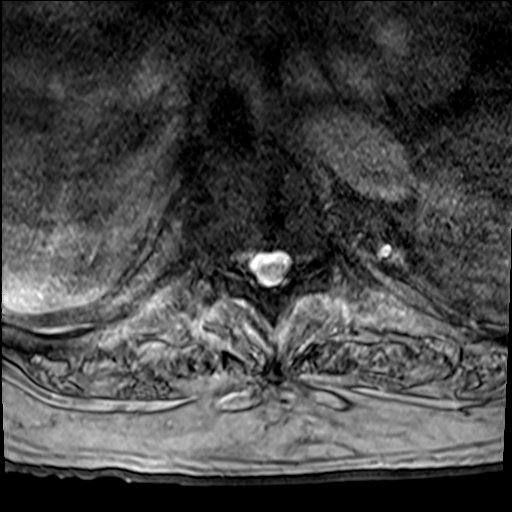
[im 17/39]
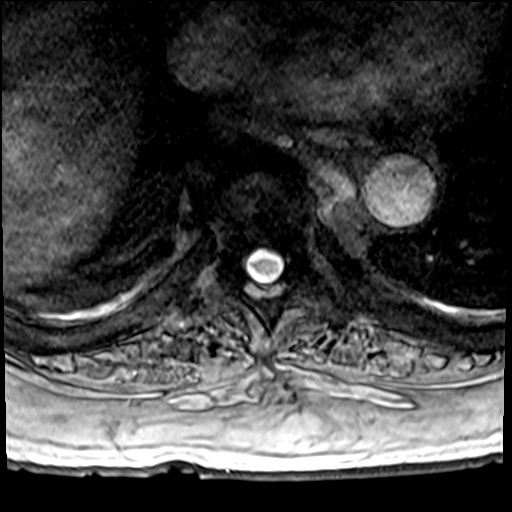
[im 20/39]
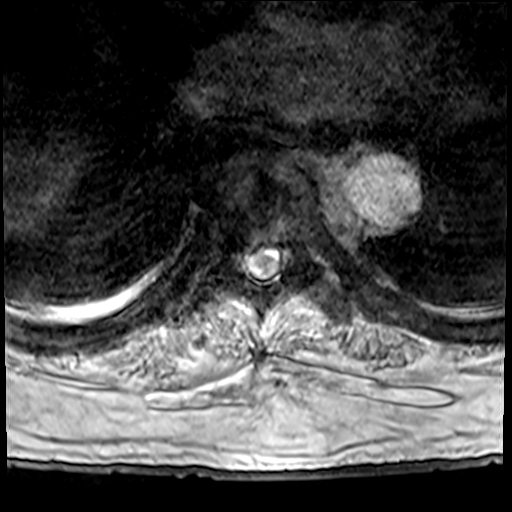
[im 22/39]
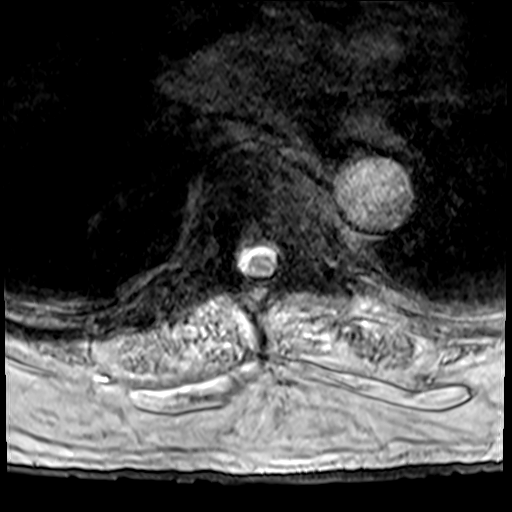
[im 27/39]
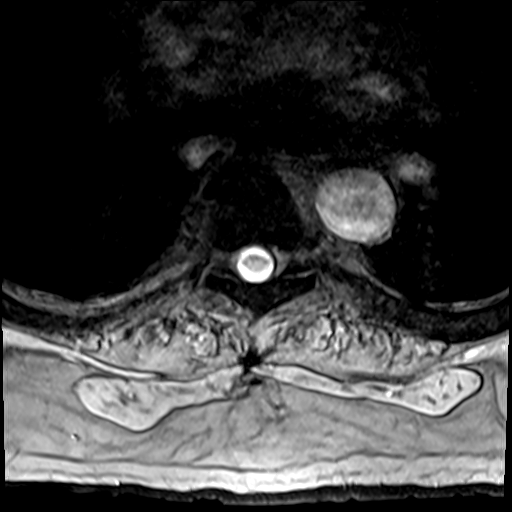
[im 32/39]
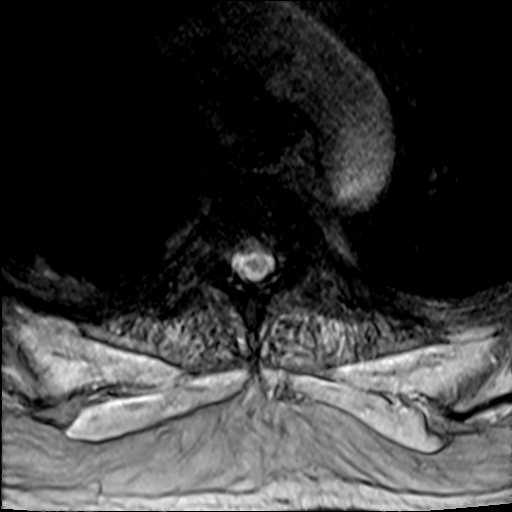
[im 34/39]
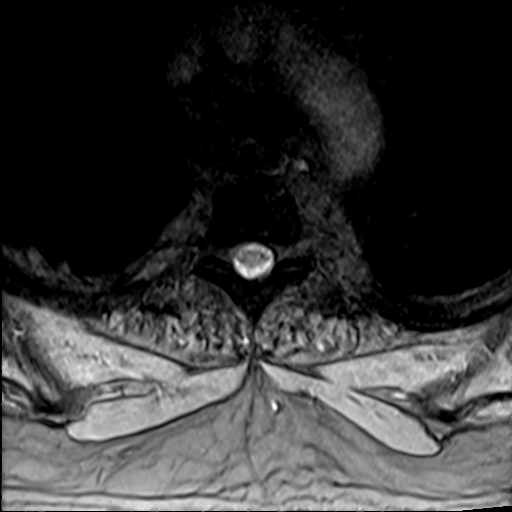

[Series 7: T2 · axial · 5.0mm · 0.59mm/px · z∈[-420,-185]mm · 10 of 39 slices shown]
[im 2/39]
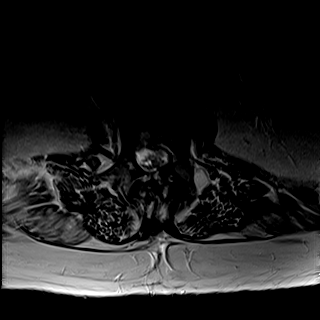
[im 7/39]
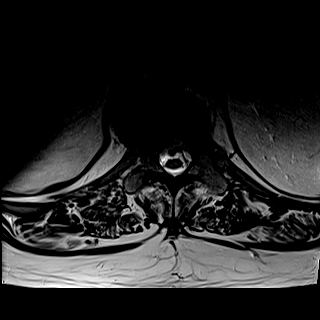
[im 12/39]
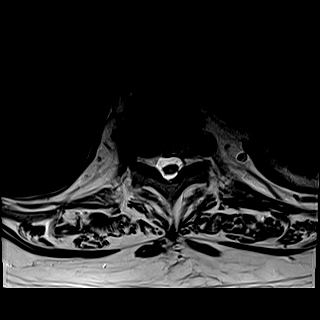
[im 17/39]
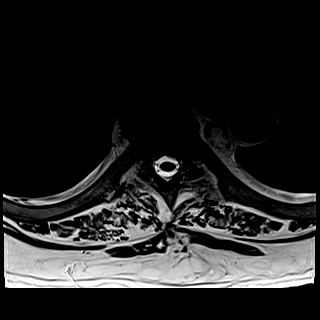
[im 20/39]
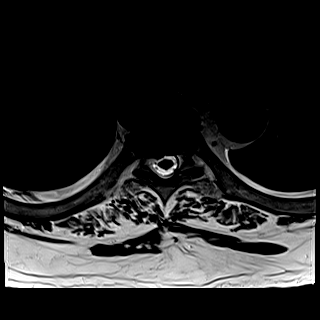
[im 22/39]
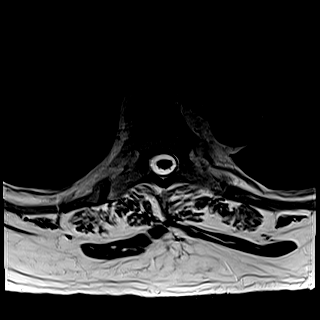
[im 27/39]
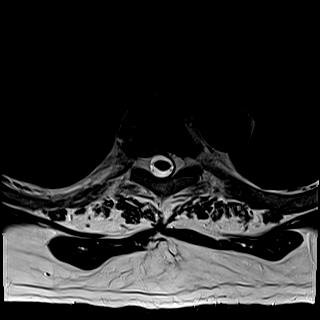
[im 32/39]
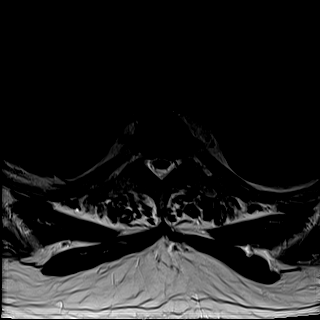
[im 34/39]
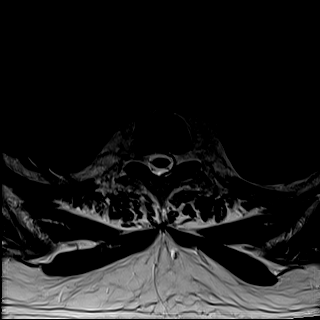
[im 37/39]
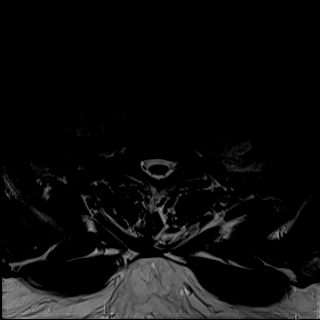

[19 of 48 positions shown; findings below may reference images not displayed]

FINDINGS: MRI CERVICAL SPINE FINDINGS

Alignment: There is straightening of the normal cervical spine
lordosis. There is no antero or retrolisthesis.

Vertebrae: Vertebral body heights are preserved. There is no
suspicious marrow signal abnormality. There is no marrow edema.

Cord: Normal in signal and morphology.

Posterior Fossa, vertebral arteries, paraspinal tissues: The imaged
posterior fossa is unremarkable. The vertebral artery flow voids are
present. The paraspinal soft tissues are unremarkable.

Disc levels:

Evaluation of the neural foramina is suboptimal due to poor signal
on the axial T2 images.

There is multilevel disc desiccation and narrowing, most advanced at
C4-C5 and C5-C6. There is multilevel facet arthropathy, overall left
worse than right.

C2-C3: Mild uncovertebral and bilateral facet arthropathy result in
mild left and no significant right neural foraminal stenosis without
significant spinal canal stenosis

C3-C4: There is a posterior disc osteophyte complex and
uncovertebral and bilateral facet arthropathy resulting in severe
left worse than right neural foraminal stenosis and mild spinal
canal stenosis.

C4-C5: There is a posterior disc osteophyte complex and
uncovertebral and bilateral facet arthropathy resulting in severe
left worse than right neural foraminal stenosis and mild to moderate
spinal canal stenosis.

C5-C6: There is a posterior disc osteophyte complex and
uncovertebral and bilateral facet arthropathy resulting in severe
bilateral neural foraminal stenosis and mild spinal canal stenosis.

C6-C7: There is a posterior disc osteophyte complex and
uncovertebral and bilateral facet arthropathy resulting in severe
bilateral neural foraminal stenosis and mild-to-moderate spinal
canal stenosis.

C7-T1: There is a mild disc bulge resulting in mild right and no
significant left neural foraminal stenosis without significant
spinal canal stenosis.

MRI THORACIC SPINE FINDINGS

Alignment:  Normal.

Vertebrae: Vertebral body heights are preserved. There is partial
fusion of the T5 and T6 vertebral bodies.

Cord:  Normal in signal and morphology.

Paraspinal and other soft tissues: Unremarkable.

Disc levels:

There is multilevel disc desiccation and narrowing throughout the
thoracic spine. There are bulky flowing anterior endplate
osteophytes throughout the mid and lower thoracic spine consistent
with diffuse idiopathic skeletal hyperostosis.

T2-T3: There is a mild disc protrusion without significant spinal
canal or neural foraminal stenosis.

T3-T4: There is a small right paracentral disc protrusion without
significant spinal canal or neural foraminal stenosis.

T4-T5: There is a small left paracentral disc protrusion without
significant spinal canal or neural foraminal stenosis.

T6-T7: There is a small central protrusion resulting in mild spinal
canal narrowing without significant neural foraminal stenosis.

T7-T8: There is a small left paracentral disc protrusion without
significant spinal canal or neural foraminal stenosis.

T8-T9: There is a prominent central protrusion resulting in mild
spinal canal stenosis with effacement of the ventral thecal sac but
no mass effect on the cord, and no significant neural foraminal
stenosis.

T9-T10: There is a small central protrusion without significant
spinal canal or neural foraminal stenosis.

T10-T11: There is a large disc protrusion/extrusion and bilateral
facet arthropathy resulting in moderate spinal canal stenosis with
effacement of the ventral thecal sac and mild indentation of the
cord and mild bilateral neural foraminal stenosis.

T11-T12: There is a prominent central protrusion resulting in mild
spinal canal stenosis with effacement of the ventral thecal sac
without significant neural foraminal stenosis.

T12-L1: There is a prominent central protrusion/extrusion resulting
in mild-to-moderate spinal canal stenosis without significant neural
foraminal stenosis.

MRI LUMBAR SPINE FINDINGS

Segmentation:  Standard.

Alignment: There is dextroscoliosis of the lumbar spine centered at
L2-L3. There is grade 1 retrolisthesis of L1 on L2, L2 on L3, L3 on
L4, and L4 on L5, all likely degenerative in nature.

Vertebrae: There is fusion of the L1 through L3 vertebral bodies.
Marrow signal is mildly heterogeneous with degenerative signal
abnormality at L1 through L3 and about the L4-L5 disc space. There
is no suspicious marrow signal abnormality.

Postsurgical changes are noted reflecting posterior decompression at
L1-L2 through L3-L4.

Conus medullaris and cauda equina: Conus extends to the mid L1
level. Conus and cauda equina appear normal.

Paraspinal and other soft tissues: There is atrophy of the right
psoas muscle.

Disc levels:

There is obliteration of the disc spaces at L1-L2 and L2-L3. There
is advanced disc space narrowing and desiccation throughout the
remainder of the lumbar spine.

There is severe facet arthropathy at L4-L5 and L5-S1, slightly worse
on the right. There are associated trace bilateral facet joint
effusions.

L1-L2: Status post posterior decompression. There is a broad-based
central disc protrusion, degenerative endplate change, and bilateral
facet arthropathy resulting in mild spinal canal stenosis with
effacement of the left subarticular zone and suspected impingement
of the traversing left L2 nerve root without significant neural
foraminal stenosis.

L2-L3: Status post posterior decompression. There is degenerative
endplate change and bilateral facet arthropathy resulting in mild
left and no significant right neural foraminal stenosis and no
significant spinal canal stenosis.

L3-L4: Status post posterior decompression. There is a mild disc
bulge, degenerative endplate change, and bilateral facet arthropathy
resulting in mild narrowing of the subarticular zones, left worse
than right, without evidence of frank nerve root impingement, and
mild right worse than left neural foraminal stenosis.

L4-L5: There is prominent endplate spurring and bulky bilateral
facet arthropathy resulting in moderate to severe spinal canal
stenosis with effacement of the subarticular zones and possible
impingement of the traversing L5 nerve roots, and moderate left and
severe right neural foraminal stenosis with impingement of the
exiting right L4 nerve root.

L5-S1: There is a diffuse disc bulge, degenerative endplate change,
and bilateral facet arthropathy resulting in severe left worse than
right with impingement of the exiting L5 nerve roots.
IMPRESSION: CERVICAL SPINE MRI:

1. Evaluation of the neural foramina is suboptimal due to poor
signal on the axial T2 images. Within this confine:
2. Multilevel degenerative changes throughout the cervical spine as
detailed above resulting in up to mild-to-moderate spinal canal
stenosis at C4-C5 and C6-C7 and severe bilateral neural foraminal
stenosis at C3-C4 through C6-C7.
3. No evidence of cord compression or cord signal abnormality.

THORACIC SPINE MRI:

1. Large disc protrusion/extrusion at T10-T11 resulting in moderate
spinal canal stenosis with mild indentation of the cord and mild
bilateral neural foraminal stenosis.
2. Numerous additional disc protrusions throughout the remainder of
the thoracic spine as detailed above without other high-grade spinal
canal stenosis. No high-grade neural foraminal stenosis or evidence
of nerve root impingement in the thoracic spine.
3. Flowing anterior osteophytes in the mid and lower thoracic spine
consistent with diffuse idiopathic skeletal hyperostosis.
4. No cord signal abnormality.

LUMBAR SPINE MRI:

1. Postsurgical changes reflecting posterior decompression at L1-L2
through L3-L4. No high-grade spinal canal stenosis at the surgical
levels, but there is effacement of the left subarticular zone at
L1-L2 with possible impingement of the traversing left L2 nerve
root.
2. Moderate to severe spinal canal stenosis at L4-L5 with possible
impingement of the traversing nerve roots, and moderate left and
severe right neural foraminal stenosis with impingement of the
exiting right L4 nerve root.
3. Severe left worse than right neural foraminal stenosis at L5-S1
with impingement of the exiting L5 nerve roots.
4. Stepwise grade 1 retrolisthesis of L1 on L2 through L4 on L5,
likely degenerative in nature.
5. Fusion of the L1 through L3 vertebral bodies.
6. Severe facet arthropathy at L4-L5 and L5-S1, slightly worse on
the right, with associated trace facet joint effusions.

## 2021-04-11 IMAGING — MR MR CERVICAL SPINE W/O CM
5 series · 30 of 48 positions shown · non-contrast
Comparison: None.

CLINICAL DATA: Incontinence, numbness, weakness. Numbness in hands
starting yesterday, no control of urine or bowel movements, unable
to walk, no feeling in legs

EXAM:
MRI CERVICAL, THORACIC AND LUMBAR SPINE WITHOUT CONTRAST
TECHNIQUE: Multiplanar and multiecho pulse sequences of the cervical spine, to
include the craniocervical junction and cervicothoracic junction,
and thoracic and lumbar spine, were obtained without intravenous
contrast.

[Series 1: T2 · sagittal · 3.0mm · 0.69mm/px · 6 of 15 slices shown (1 of 2)]
[im 1/15]
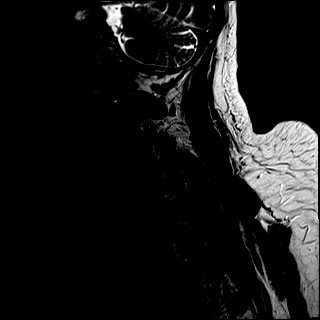
[im 3/15]
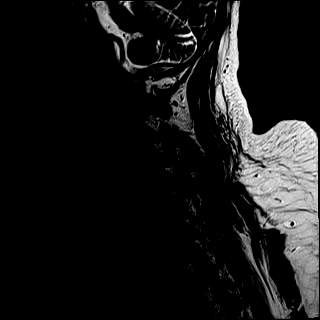
[im 6/15]
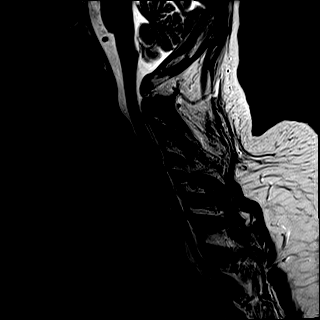
[im 9/15]
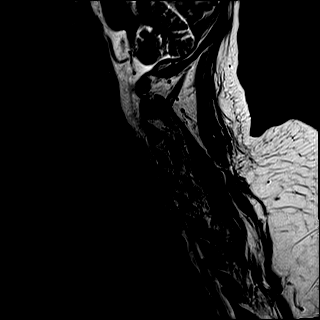
[im 12/15]
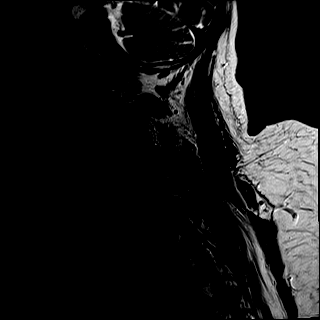
[im 15/15]
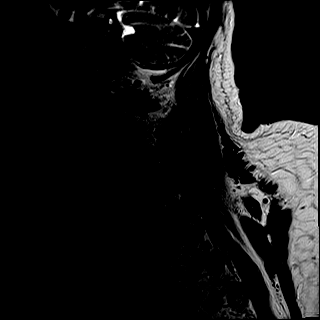

[Series 2: T1 · sagittal · 3.0mm · 0.69mm/px · 6 of 15 slices shown]
[im 1/15]
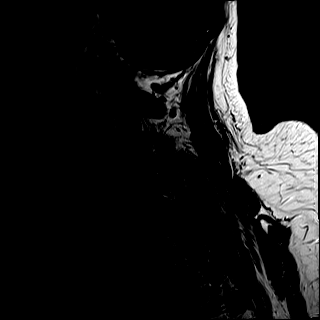
[im 3/15]
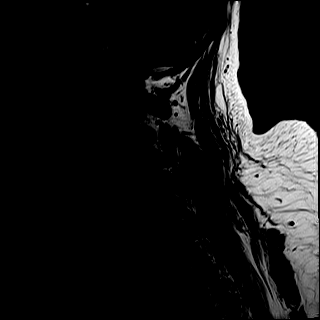
[im 6/15]
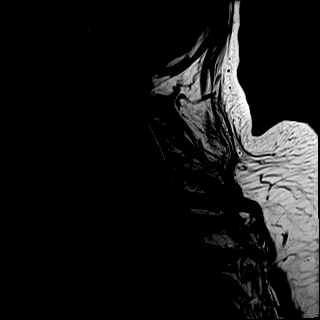
[im 9/15]
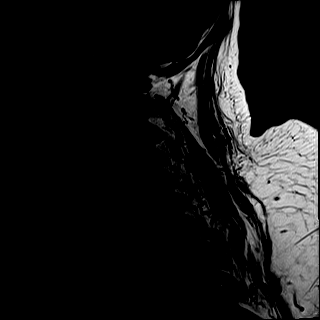
[im 12/15]
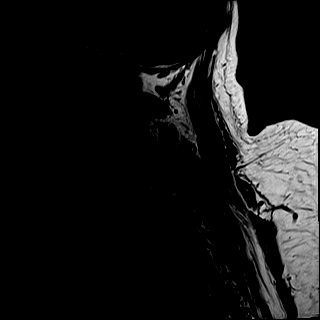
[im 15/15]
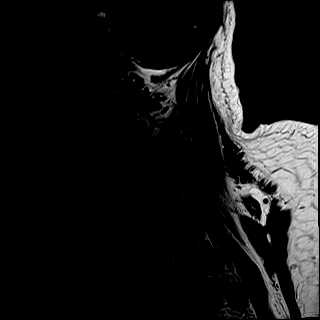

[Series 3: STIR · sagittal · 3.0mm · 0.86mm/px · 6 of 15 slices shown]
[im 1/15]
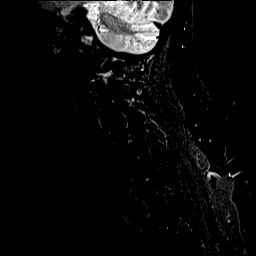
[im 3/15]
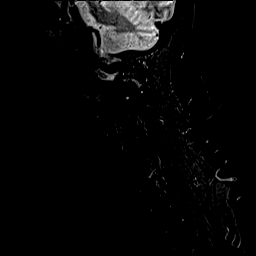
[im 6/15]
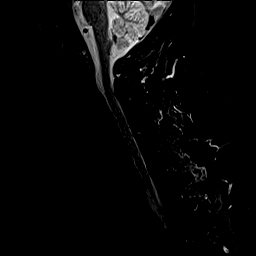
[im 9/15]
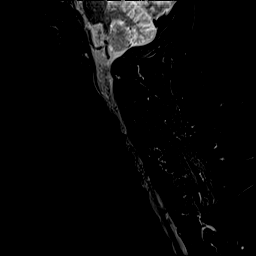
[im 12/15]
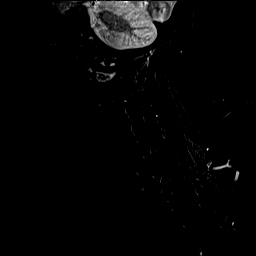
[im 15/15]
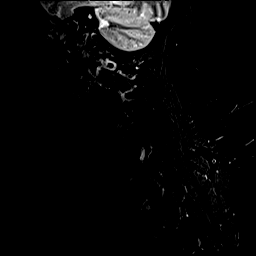

[Series 4: T2 · axial · 3.0mm · 0.66mm/px · z∈[-197,-76]mm · 9 of 40 slices shown (2 of 2)]
[im 1/40]
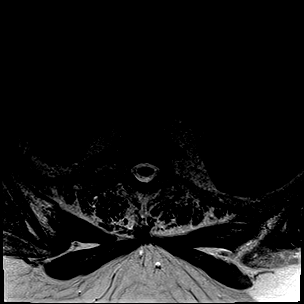
[im 6/40]
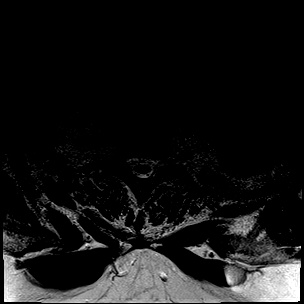
[im 12/40]
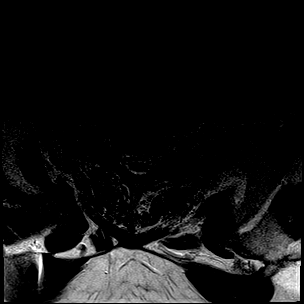
[im 17/40]
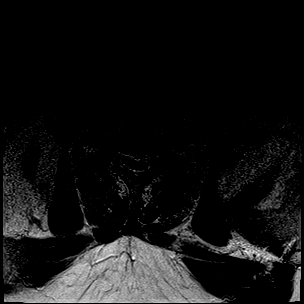
[im 20/40]
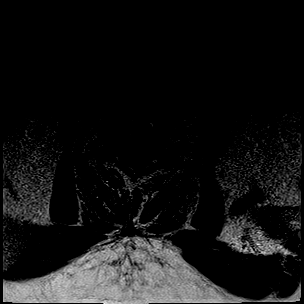
[im 23/40]
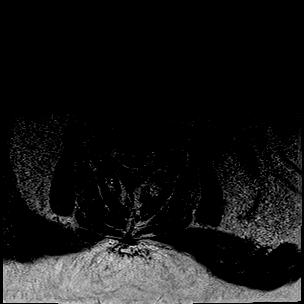
[im 28/40]
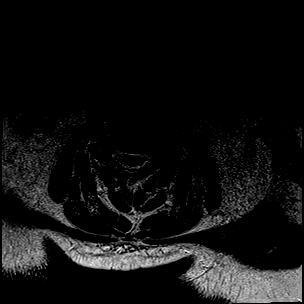
[im 34/40]
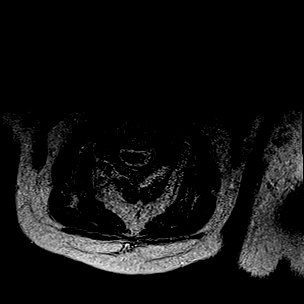
[im 40/40]
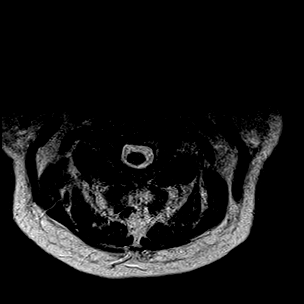

[Series 5: GRE · axial · 3.0mm · 0.35mm/px · z∈[-194,-160]mm · 3 of 40 slices shown]
[im 1/40]
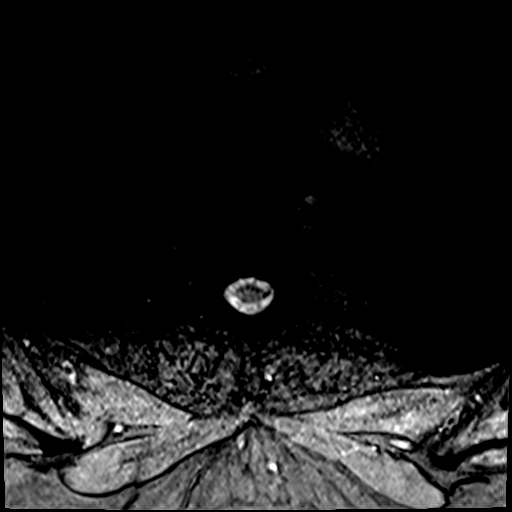
[im 6/40]
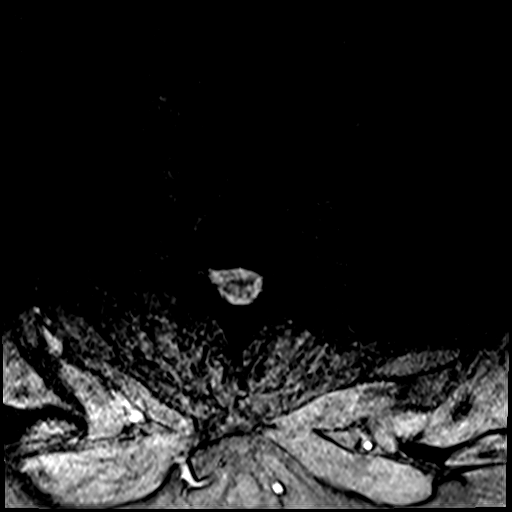
[im 12/40]
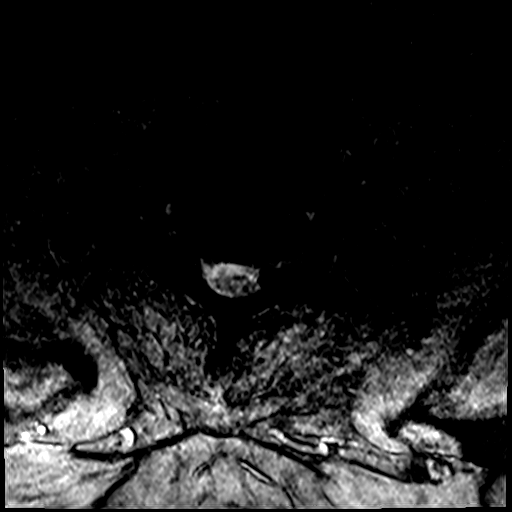

[30 of 48 positions shown; findings below may reference images not displayed]

FINDINGS: MRI CERVICAL SPINE FINDINGS

Alignment: There is straightening of the normal cervical spine
lordosis. There is no antero or retrolisthesis.

Vertebrae: Vertebral body heights are preserved. There is no
suspicious marrow signal abnormality. There is no marrow edema.

Cord: Normal in signal and morphology.

Posterior Fossa, vertebral arteries, paraspinal tissues: The imaged
posterior fossa is unremarkable. The vertebral artery flow voids are
present. The paraspinal soft tissues are unremarkable.

Disc levels:

Evaluation of the neural foramina is suboptimal due to poor signal
on the axial T2 images.

There is multilevel disc desiccation and narrowing, most advanced at
C4-C5 and C5-C6. There is multilevel facet arthropathy, overall left
worse than right.

C2-C3: Mild uncovertebral and bilateral facet arthropathy result in
mild left and no significant right neural foraminal stenosis without
significant spinal canal stenosis

C3-C4: There is a posterior disc osteophyte complex and
uncovertebral and bilateral facet arthropathy resulting in severe
left worse than right neural foraminal stenosis and mild spinal
canal stenosis.

C4-C5: There is a posterior disc osteophyte complex and
uncovertebral and bilateral facet arthropathy resulting in severe
left worse than right neural foraminal stenosis and mild to moderate
spinal canal stenosis.

C5-C6: There is a posterior disc osteophyte complex and
uncovertebral and bilateral facet arthropathy resulting in severe
bilateral neural foraminal stenosis and mild spinal canal stenosis.

C6-C7: There is a posterior disc osteophyte complex and
uncovertebral and bilateral facet arthropathy resulting in severe
bilateral neural foraminal stenosis and mild-to-moderate spinal
canal stenosis.

C7-T1: There is a mild disc bulge resulting in mild right and no
significant left neural foraminal stenosis without significant
spinal canal stenosis.

MRI THORACIC SPINE FINDINGS

Alignment:  Normal.

Vertebrae: Vertebral body heights are preserved. There is partial
fusion of the T5 and T6 vertebral bodies.

Cord:  Normal in signal and morphology.

Paraspinal and other soft tissues: Unremarkable.

Disc levels:

There is multilevel disc desiccation and narrowing throughout the
thoracic spine. There are bulky flowing anterior endplate
osteophytes throughout the mid and lower thoracic spine consistent
with diffuse idiopathic skeletal hyperostosis.

T2-T3: There is a mild disc protrusion without significant spinal
canal or neural foraminal stenosis.

T3-T4: There is a small right paracentral disc protrusion without
significant spinal canal or neural foraminal stenosis.

T4-T5: There is a small left paracentral disc protrusion without
significant spinal canal or neural foraminal stenosis.

T6-T7: There is a small central protrusion resulting in mild spinal
canal narrowing without significant neural foraminal stenosis.

T7-T8: There is a small left paracentral disc protrusion without
significant spinal canal or neural foraminal stenosis.

T8-T9: There is a prominent central protrusion resulting in mild
spinal canal stenosis with effacement of the ventral thecal sac but
no mass effect on the cord, and no significant neural foraminal
stenosis.

T9-T10: There is a small central protrusion without significant
spinal canal or neural foraminal stenosis.

T10-T11: There is a large disc protrusion/extrusion and bilateral
facet arthropathy resulting in moderate spinal canal stenosis with
effacement of the ventral thecal sac and mild indentation of the
cord and mild bilateral neural foraminal stenosis.

T11-T12: There is a prominent central protrusion resulting in mild
spinal canal stenosis with effacement of the ventral thecal sac
without significant neural foraminal stenosis.

T12-L1: There is a prominent central protrusion/extrusion resulting
in mild-to-moderate spinal canal stenosis without significant neural
foraminal stenosis.

MRI LUMBAR SPINE FINDINGS

Segmentation:  Standard.

Alignment: There is dextroscoliosis of the lumbar spine centered at
L2-L3. There is grade 1 retrolisthesis of L1 on L2, L2 on L3, L3 on
L4, and L4 on L5, all likely degenerative in nature.

Vertebrae: There is fusion of the L1 through L3 vertebral bodies.
Marrow signal is mildly heterogeneous with degenerative signal
abnormality at L1 through L3 and about the L4-L5 disc space. There
is no suspicious marrow signal abnormality.

Postsurgical changes are noted reflecting posterior decompression at
L1-L2 through L3-L4.

Conus medullaris and cauda equina: Conus extends to the mid L1
level. Conus and cauda equina appear normal.

Paraspinal and other soft tissues: There is atrophy of the right
psoas muscle.

Disc levels:

There is obliteration of the disc spaces at L1-L2 and L2-L3. There
is advanced disc space narrowing and desiccation throughout the
remainder of the lumbar spine.

There is severe facet arthropathy at L4-L5 and L5-S1, slightly worse
on the right. There are associated trace bilateral facet joint
effusions.

L1-L2: Status post posterior decompression. There is a broad-based
central disc protrusion, degenerative endplate change, and bilateral
facet arthropathy resulting in mild spinal canal stenosis with
effacement of the left subarticular zone and suspected impingement
of the traversing left L2 nerve root without significant neural
foraminal stenosis.

L2-L3: Status post posterior decompression. There is degenerative
endplate change and bilateral facet arthropathy resulting in mild
left and no significant right neural foraminal stenosis and no
significant spinal canal stenosis.

L3-L4: Status post posterior decompression. There is a mild disc
bulge, degenerative endplate change, and bilateral facet arthropathy
resulting in mild narrowing of the subarticular zones, left worse
than right, without evidence of frank nerve root impingement, and
mild right worse than left neural foraminal stenosis.

L4-L5: There is prominent endplate spurring and bulky bilateral
facet arthropathy resulting in moderate to severe spinal canal
stenosis with effacement of the subarticular zones and possible
impingement of the traversing L5 nerve roots, and moderate left and
severe right neural foraminal stenosis with impingement of the
exiting right L4 nerve root.

L5-S1: There is a diffuse disc bulge, degenerative endplate change,
and bilateral facet arthropathy resulting in severe left worse than
right with impingement of the exiting L5 nerve roots.
IMPRESSION: CERVICAL SPINE MRI:

1. Evaluation of the neural foramina is suboptimal due to poor
signal on the axial T2 images. Within this confine:
2. Multilevel degenerative changes throughout the cervical spine as
detailed above resulting in up to mild-to-moderate spinal canal
stenosis at C4-C5 and C6-C7 and severe bilateral neural foraminal
stenosis at C3-C4 through C6-C7.
3. No evidence of cord compression or cord signal abnormality.

THORACIC SPINE MRI:

1. Large disc protrusion/extrusion at T10-T11 resulting in moderate
spinal canal stenosis with mild indentation of the cord and mild
bilateral neural foraminal stenosis.
2. Numerous additional disc protrusions throughout the remainder of
the thoracic spine as detailed above without other high-grade spinal
canal stenosis. No high-grade neural foraminal stenosis or evidence
of nerve root impingement in the thoracic spine.
3. Flowing anterior osteophytes in the mid and lower thoracic spine
consistent with diffuse idiopathic skeletal hyperostosis.
4. No cord signal abnormality.

LUMBAR SPINE MRI:

1. Postsurgical changes reflecting posterior decompression at L1-L2
through L3-L4. No high-grade spinal canal stenosis at the surgical
levels, but there is effacement of the left subarticular zone at
L1-L2 with possible impingement of the traversing left L2 nerve
root.
2. Moderate to severe spinal canal stenosis at L4-L5 with possible
impingement of the traversing nerve roots, and moderate left and
severe right neural foraminal stenosis with impingement of the
exiting right L4 nerve root.
3. Severe left worse than right neural foraminal stenosis at L5-S1
with impingement of the exiting L5 nerve roots.
4. Stepwise grade 1 retrolisthesis of L1 on L2 through L4 on L5,
likely degenerative in nature.
5. Fusion of the L1 through L3 vertebral bodies.
6. Severe facet arthropathy at L4-L5 and L5-S1, slightly worse on
the right, with associated trace facet joint effusions.

## 2021-04-11 IMAGING — MR MR LUMBAR SPINE W/O CM
4 of 5 series · 19 of 48 positions shown · non-contrast
Comparison: None.

CLINICAL DATA: Incontinence, numbness, weakness. Numbness in hands
starting yesterday, no control of urine or bowel movements, unable
to walk, no feeling in legs

EXAM:
MRI CERVICAL, THORACIC AND LUMBAR SPINE WITHOUT CONTRAST
TECHNIQUE: Multiplanar and multiecho pulse sequences of the cervical spine, to
include the craniocervical junction and cervicothoracic junction,
and thoracic and lumbar spine, were obtained without intravenous
contrast.

[Series 1: T2 · sagittal · 4.0mm · 0.73mm/px · 7 of 18 slices shown (1 of 2)]
[im 1/18]
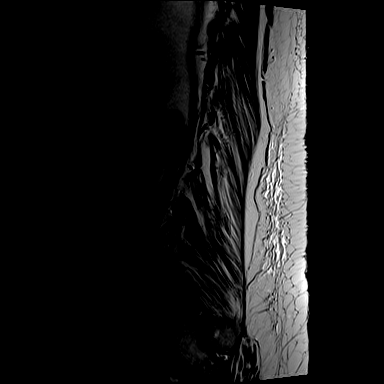
[im 3/18]
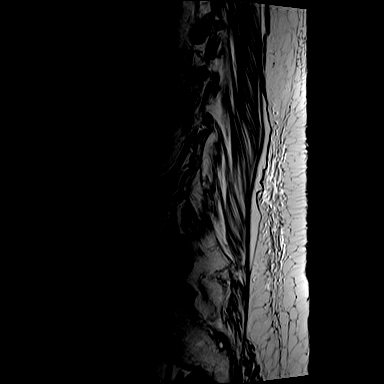
[im 6/18]
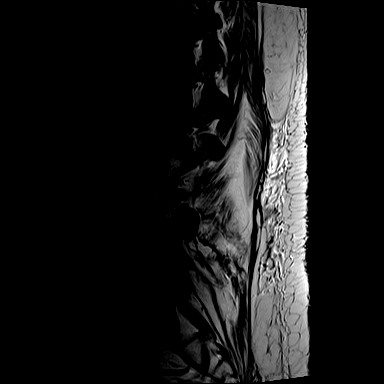
[im 9/18]
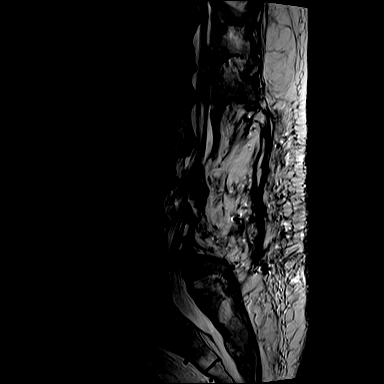
[im 12/18]
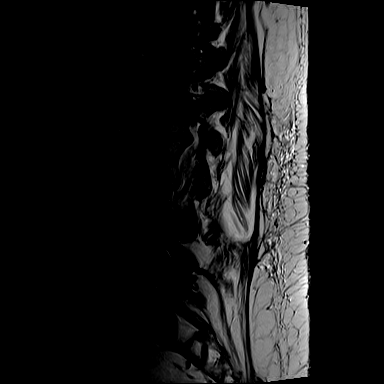
[im 15/18]
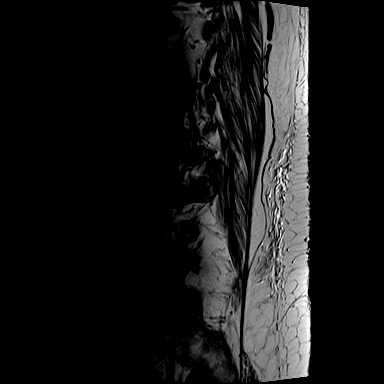
[im 18/18]
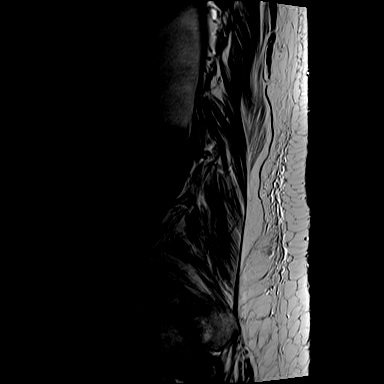

[Series 3: T1 · sagittal · 4.0mm · 0.88mm/px · 3 of 18 slices shown (1 of 2)]
[im 3/18]
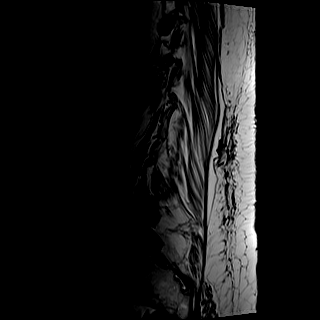
[im 10/18]
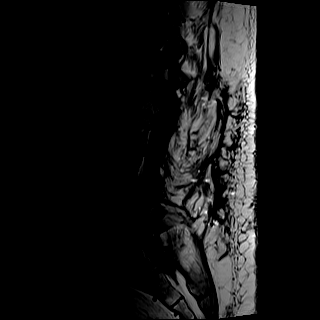
[im 15/18]
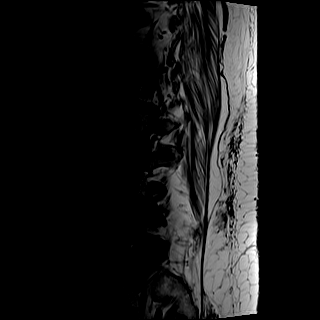

[Series 4: T2 · axial · 5.0mm · 0.57mm/px · z∈[-584,-407]mm · 6 of 30 slices shown (2 of 2)]
[im 1/30]
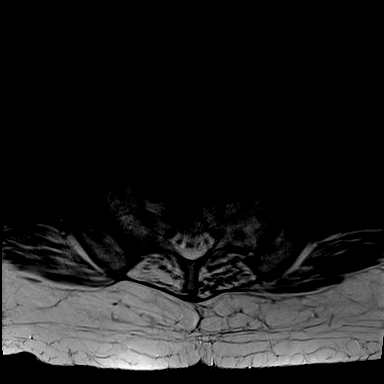
[im 5/30]
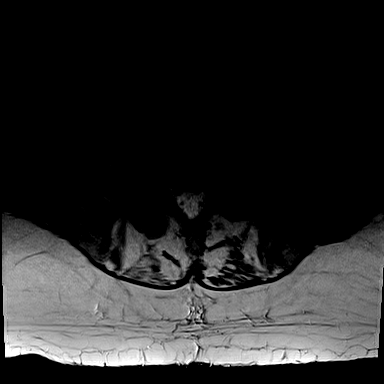
[im 10/30]
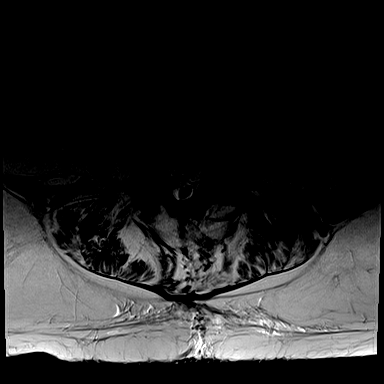
[im 13/30]
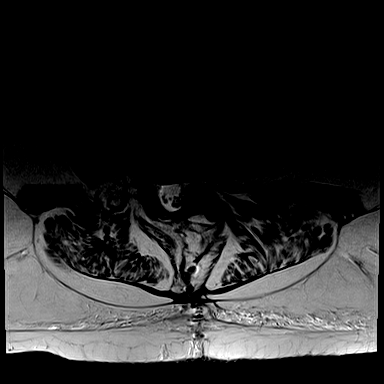
[im 15/30]
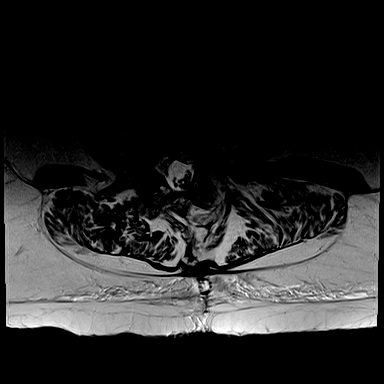
[im 25/30]
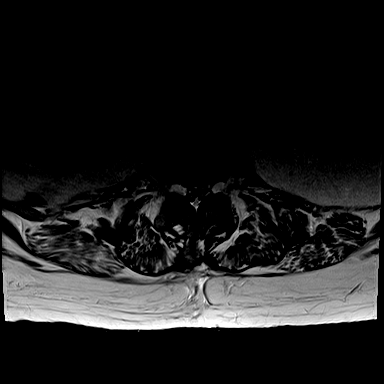

[Series 5: T1 · axial · 5.0mm · 0.34mm/px · z∈[-554,-407]mm · 3 of 30 slices shown (2 of 2)]
[im 5/30]
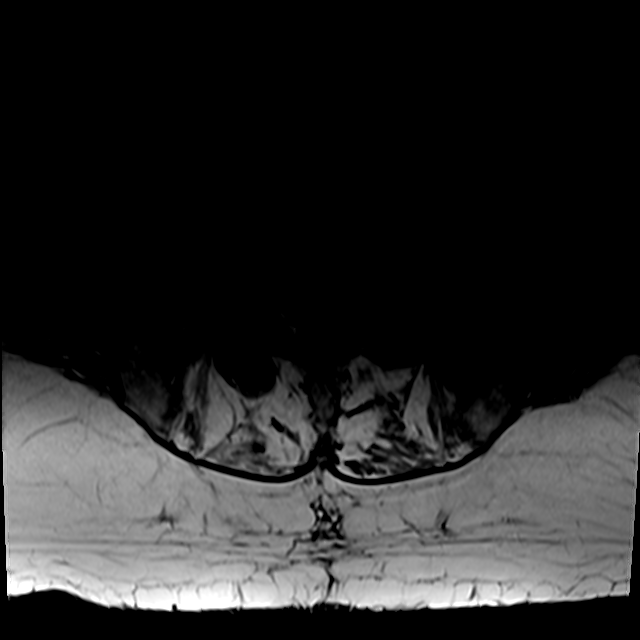
[im 15/30]
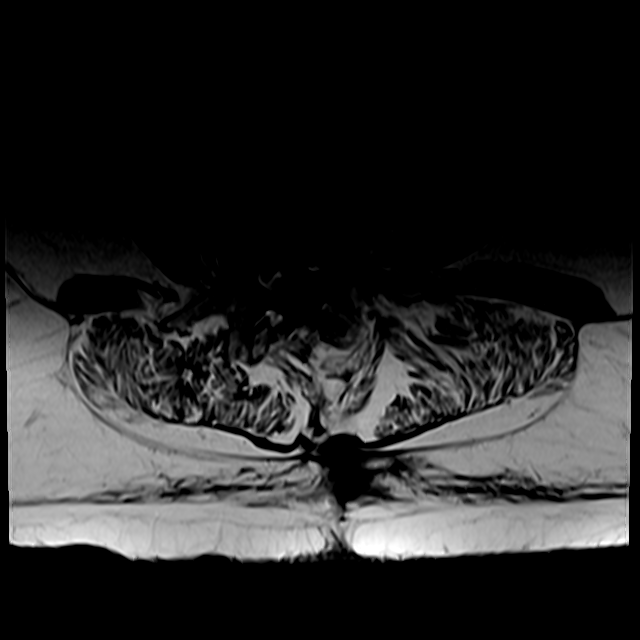
[im 25/30]
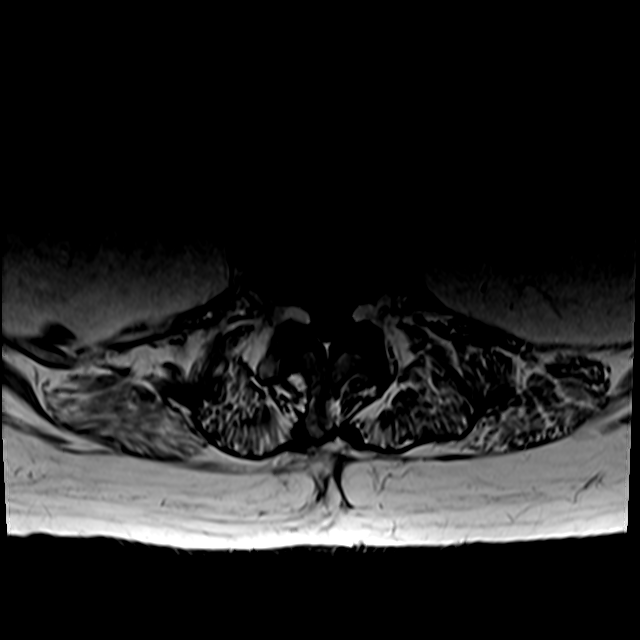

[19 of 48 positions shown; findings below may reference images not displayed]

FINDINGS: MRI CERVICAL SPINE FINDINGS

Alignment: There is straightening of the normal cervical spine
lordosis. There is no antero or retrolisthesis.

Vertebrae: Vertebral body heights are preserved. There is no
suspicious marrow signal abnormality. There is no marrow edema.

Cord: Normal in signal and morphology.

Posterior Fossa, vertebral arteries, paraspinal tissues: The imaged
posterior fossa is unremarkable. The vertebral artery flow voids are
present. The paraspinal soft tissues are unremarkable.

Disc levels:

Evaluation of the neural foramina is suboptimal due to poor signal
on the axial T2 images.

There is multilevel disc desiccation and narrowing, most advanced at
C4-C5 and C5-C6. There is multilevel facet arthropathy, overall left
worse than right.

C2-C3: Mild uncovertebral and bilateral facet arthropathy result in
mild left and no significant right neural foraminal stenosis without
significant spinal canal stenosis

C3-C4: There is a posterior disc osteophyte complex and
uncovertebral and bilateral facet arthropathy resulting in severe
left worse than right neural foraminal stenosis and mild spinal
canal stenosis.

C4-C5: There is a posterior disc osteophyte complex and
uncovertebral and bilateral facet arthropathy resulting in severe
left worse than right neural foraminal stenosis and mild to moderate
spinal canal stenosis.

C5-C6: There is a posterior disc osteophyte complex and
uncovertebral and bilateral facet arthropathy resulting in severe
bilateral neural foraminal stenosis and mild spinal canal stenosis.

C6-C7: There is a posterior disc osteophyte complex and
uncovertebral and bilateral facet arthropathy resulting in severe
bilateral neural foraminal stenosis and mild-to-moderate spinal
canal stenosis.

C7-T1: There is a mild disc bulge resulting in mild right and no
significant left neural foraminal stenosis without significant
spinal canal stenosis.

MRI THORACIC SPINE FINDINGS

Alignment:  Normal.

Vertebrae: Vertebral body heights are preserved. There is partial
fusion of the T5 and T6 vertebral bodies.

Cord:  Normal in signal and morphology.

Paraspinal and other soft tissues: Unremarkable.

Disc levels:

There is multilevel disc desiccation and narrowing throughout the
thoracic spine. There are bulky flowing anterior endplate
osteophytes throughout the mid and lower thoracic spine consistent
with diffuse idiopathic skeletal hyperostosis.

T2-T3: There is a mild disc protrusion without significant spinal
canal or neural foraminal stenosis.

T3-T4: There is a small right paracentral disc protrusion without
significant spinal canal or neural foraminal stenosis.

T4-T5: There is a small left paracentral disc protrusion without
significant spinal canal or neural foraminal stenosis.

T6-T7: There is a small central protrusion resulting in mild spinal
canal narrowing without significant neural foraminal stenosis.

T7-T8: There is a small left paracentral disc protrusion without
significant spinal canal or neural foraminal stenosis.

T8-T9: There is a prominent central protrusion resulting in mild
spinal canal stenosis with effacement of the ventral thecal sac but
no mass effect on the cord, and no significant neural foraminal
stenosis.

T9-T10: There is a small central protrusion without significant
spinal canal or neural foraminal stenosis.

T10-T11: There is a large disc protrusion/extrusion and bilateral
facet arthropathy resulting in moderate spinal canal stenosis with
effacement of the ventral thecal sac and mild indentation of the
cord and mild bilateral neural foraminal stenosis.

T11-T12: There is a prominent central protrusion resulting in mild
spinal canal stenosis with effacement of the ventral thecal sac
without significant neural foraminal stenosis.

T12-L1: There is a prominent central protrusion/extrusion resulting
in mild-to-moderate spinal canal stenosis without significant neural
foraminal stenosis.

MRI LUMBAR SPINE FINDINGS

Segmentation:  Standard.

Alignment: There is dextroscoliosis of the lumbar spine centered at
L2-L3. There is grade 1 retrolisthesis of L1 on L2, L2 on L3, L3 on
L4, and L4 on L5, all likely degenerative in nature.

Vertebrae: There is fusion of the L1 through L3 vertebral bodies.
Marrow signal is mildly heterogeneous with degenerative signal
abnormality at L1 through L3 and about the L4-L5 disc space. There
is no suspicious marrow signal abnormality.

Postsurgical changes are noted reflecting posterior decompression at
L1-L2 through L3-L4.

Conus medullaris and cauda equina: Conus extends to the mid L1
level. Conus and cauda equina appear normal.

Paraspinal and other soft tissues: There is atrophy of the right
psoas muscle.

Disc levels:

There is obliteration of the disc spaces at L1-L2 and L2-L3. There
is advanced disc space narrowing and desiccation throughout the
remainder of the lumbar spine.

There is severe facet arthropathy at L4-L5 and L5-S1, slightly worse
on the right. There are associated trace bilateral facet joint
effusions.

L1-L2: Status post posterior decompression. There is a broad-based
central disc protrusion, degenerative endplate change, and bilateral
facet arthropathy resulting in mild spinal canal stenosis with
effacement of the left subarticular zone and suspected impingement
of the traversing left L2 nerve root without significant neural
foraminal stenosis.

L2-L3: Status post posterior decompression. There is degenerative
endplate change and bilateral facet arthropathy resulting in mild
left and no significant right neural foraminal stenosis and no
significant spinal canal stenosis.

L3-L4: Status post posterior decompression. There is a mild disc
bulge, degenerative endplate change, and bilateral facet arthropathy
resulting in mild narrowing of the subarticular zones, left worse
than right, without evidence of frank nerve root impingement, and
mild right worse than left neural foraminal stenosis.

L4-L5: There is prominent endplate spurring and bulky bilateral
facet arthropathy resulting in moderate to severe spinal canal
stenosis with effacement of the subarticular zones and possible
impingement of the traversing L5 nerve roots, and moderate left and
severe right neural foraminal stenosis with impingement of the
exiting right L4 nerve root.

L5-S1: There is a diffuse disc bulge, degenerative endplate change,
and bilateral facet arthropathy resulting in severe left worse than
right with impingement of the exiting L5 nerve roots.
IMPRESSION: CERVICAL SPINE MRI:

1. Evaluation of the neural foramina is suboptimal due to poor
signal on the axial T2 images. Within this confine:
2. Multilevel degenerative changes throughout the cervical spine as
detailed above resulting in up to mild-to-moderate spinal canal
stenosis at C4-C5 and C6-C7 and severe bilateral neural foraminal
stenosis at C3-C4 through C6-C7.
3. No evidence of cord compression or cord signal abnormality.

THORACIC SPINE MRI:

1. Large disc protrusion/extrusion at T10-T11 resulting in moderate
spinal canal stenosis with mild indentation of the cord and mild
bilateral neural foraminal stenosis.
2. Numerous additional disc protrusions throughout the remainder of
the thoracic spine as detailed above without other high-grade spinal
canal stenosis. No high-grade neural foraminal stenosis or evidence
of nerve root impingement in the thoracic spine.
3. Flowing anterior osteophytes in the mid and lower thoracic spine
consistent with diffuse idiopathic skeletal hyperostosis.
4. No cord signal abnormality.

LUMBAR SPINE MRI:

1. Postsurgical changes reflecting posterior decompression at L1-L2
through L3-L4. No high-grade spinal canal stenosis at the surgical
levels, but there is effacement of the left subarticular zone at
L1-L2 with possible impingement of the traversing left L2 nerve
root.
2. Moderate to severe spinal canal stenosis at L4-L5 with possible
impingement of the traversing nerve roots, and moderate left and
severe right neural foraminal stenosis with impingement of the
exiting right L4 nerve root.
3. Severe left worse than right neural foraminal stenosis at L5-S1
with impingement of the exiting L5 nerve roots.
4. Stepwise grade 1 retrolisthesis of L1 on L2 through L4 on L5,
likely degenerative in nature.
5. Fusion of the L1 through L3 vertebral bodies.
6. Severe facet arthropathy at L4-L5 and L5-S1, slightly worse on
the right, with associated trace facet joint effusions.

## 2021-04-11 MED ORDER — ACETAMINOPHEN 325 MG PO TABS
650.0000 mg | ORAL_TABLET | Freq: Four times a day (QID) | ORAL | Status: DC | PRN
  Administered 2021-04-11 – 2021-04-20 (×6): 650 mg via ORAL
  Filled 2021-04-11 (×8): qty 2

## 2021-04-11 MED ORDER — DEXAMETHASONE SODIUM PHOSPHATE 10 MG/ML IJ SOLN
10.0000 mg | Freq: Once | INTRAMUSCULAR | Status: AC
  Administered 2021-04-11: 10 mg via INTRAVENOUS
  Filled 2021-04-11: qty 1

## 2021-04-11 MED ORDER — LORAZEPAM 2 MG/ML IJ SOLN: 2.0000 mg | Freq: Once | INTRAMUSCULAR | Status: DC

## 2021-04-11 MED ORDER — CYCLOBENZAPRINE HCL 10 MG PO TABS
5.0000 mg | ORAL_TABLET | Freq: Once | ORAL | Status: AC
  Administered 2021-04-11: 5 mg via ORAL
  Filled 2021-04-11: qty 1

## 2021-04-11 MED ORDER — LORAZEPAM 2 MG/ML IJ SOLN
1.0000 mg | Freq: Once | INTRAMUSCULAR | Status: DC
Start: 2021-04-11 — End: 2021-04-11

## 2021-04-11 MED ORDER — LORAZEPAM 2 MG/ML IJ SOLN: 1.0000 mg | Freq: Once | INTRAMUSCULAR | Status: DC

## 2021-04-11 MED ORDER — SODIUM CHLORIDE 0.9% FLUSH: 3.0000 mL | Freq: Once | INTRAVENOUS | Status: DC

## 2021-04-11 MED ORDER — LORAZEPAM 2 MG/ML IJ SOLN: 0.5000 mg | Freq: Once | INTRAMUSCULAR | Status: DC

## 2021-04-11 MED ORDER — FENTANYL CITRATE PF 50 MCG/ML IJ SOSY
PREFILLED_SYRINGE | INTRAMUSCULAR | Status: AC
  Administered 2021-04-11: 50 ug via INTRAVENOUS
  Filled 2021-04-11: qty 1

## 2021-04-11 MED ORDER — ACETAMINOPHEN 650 MG RE SUPP: 650.0000 mg | Freq: Four times a day (QID) | RECTAL | Status: DC | PRN

## 2021-04-11 MED ORDER — FENTANYL CITRATE PF 50 MCG/ML IJ SOSY
50.0000 ug | PREFILLED_SYRINGE | Freq: Once | INTRAMUSCULAR | Status: AC
  Administered 2021-04-11: 50 ug via INTRAVENOUS
  Filled 2021-04-11: qty 1

## 2021-04-11 MED ORDER — LORAZEPAM 2 MG/ML IJ SOLN
1.0000 mg | Freq: Once | INTRAMUSCULAR | Status: AC
  Administered 2021-04-11: 1 mg via INTRAVENOUS
  Filled 2021-04-11: qty 1

## 2021-04-11 MED ORDER — FENTANYL CITRATE PF 50 MCG/ML IJ SOSY: 50.0000 ug | PREFILLED_SYRINGE | Freq: Once | INTRAMUSCULAR | Status: AC

## 2021-04-11 MED ORDER — DIAZEPAM 5 MG/ML IJ SOLN
5.0000 mg | Freq: Once | INTRAMUSCULAR | Status: AC
Start: 2021-04-11 — End: 2021-04-11
  Administered 2021-04-11: 5 mg via INTRAVENOUS
  Filled 2021-04-11: qty 2

## 2021-04-11 NOTE — Progress Notes (Signed)
Pt brought to MRI w/ sedation but would not let us put him in the scanner due to his claustrophobia and jerking legs. We called his RN and she asked that we give him 5 more minutes for meds to take affect. Once the was up pt was still not capable of attempting MRI and RN was unavailable to give more meds at that time and requested we send the pt back. We will be glad to attempt again once pt is ready.

## 2021-04-11 NOTE — ED Notes (Signed)
Pt states he has weakness from shoulders to toes. He noticed it yesterday while washing hands felt weird. Now he endorses numbness from shoulders to toes. Pt had back operation 2011. Pt uses a wheelchair to move around. Pt stated this morning he was not able to stand up at all. Pt stated he is unable to control his sphincter in front or back. He currently cannot feel any sensation that alerts him he has to use the restroom. Hx chronic back pain, restless leg syndrome and CPAP for apnea

## 2021-04-11 NOTE — ED Provider Triage Note (Signed)
Emergency Medicine Provider Triage Evaluation Note  Tony Mcintosh , a 76 y.o. male  was evaluated in triage.  Pt complains of weakness to BL hands, the numbness began last night around 10 PM.  He currently gets around his house with a power wheelchair, 2 canes.  He reports this was exacerbated in nature, he had trouble getting into bed.  Upon waking up this morning felt more numbness and weakness to bilateral hands, bilateral legs.   Review of Systems  Positive: Weakness, numbness, neck pain, bowel and bladder complaints Negative: Fever,   Physical Exam  BP (!) 144/85 (BP Location: Left Arm)    Pulse 62    Temp 97.8 F (36.6 C) (Oral)    Resp 16    SpO2 97%  Gen:   Awake, no distress   Resp:  Normal effort  MSK:   Moves extremities without difficulty  Other:  Diminished strength to bilateral upper extremities, bilateral lower extremities.  Medical Decision Making  Medically screening exam initiated at 9:32 AM.  Appropriate orders placed.  Uless Ogren was informed that the remainder of the evaluation will be completed by another provider, this initial triage assessment does not replace that evaluation, and the importance of remaining in the ED until their evaluation is complete.  Patient here with a prior history of chronic neck pain, chronic back pain here with exacerbated pain along with numbness and weakness to upper and lower extremities.  Reports no control in his "sphincter ".  Does not ambulate at baseline without assistance of the power wheelchair, and again.  No dysarthria, no speech, prior history of CVA.   Claude Manges, PA-C 04/11/21 [phone]

## 2021-04-11 NOTE — ED Notes (Signed)
Pt son stated his main concern is left leg spasms and pain control. He feels if his father still feel weakness and numbness he will not be able to manage him at home. EDP made aware.

## 2021-04-11 NOTE — ED Notes (Signed)
Patient transported to MRI 

## 2021-04-11 NOTE — H&P (Addendum)
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 large disc protrusion/extrusion and bilateral facet arthropathy resulting in moderate spinal canal stenosis with effacement of the ventral thecal sac and mild indentation of the cord and mild bilateral neural foraminal stenosis. T11-T12: There is a prominent central protrusion resulting in mild spinal canal stenosis with effacement of the ventral thecal sac without significant neural foraminal stenosis. T12-L1: There is a prominent central protrusion/extrusion resulting in mild-to-moderate spinal canal stenosis without significant neural foraminal stenosis. MRI LUMBAR SPINE FINDINGS Segmentation:  Standard. Alignment: There is dextroscoliosis of the lumbar spine centered at L2-L3. There is grade 1 retrolisthesis of L1 on L2, L2 on L3, L3 on L4, and L4 on L5, all likely degenerative in nature. Vertebrae: There is fusion of the L1 through L3 vertebral bodies. Marrow signal is mildly heterogeneous with degenerative signal abnormality at L1 through L3 and about the  L4-L5 disc space. There is no suspicious marrow signal abnormality. Postsurgical changes are noted reflecting posterior decompression at L1-L2 through L3-L4. Conus medullaris and cauda equina: Conus extends to the mid L1 level. Conus and cauda equina appear normal. Paraspinal and other soft tissues: There is atrophy of the right psoas muscle. Disc levels: There is obliteration of the disc spaces at L1-L2 and L2-L3. There is advanced disc space narrowing and desiccation throughout the remainder of the lumbar spine. There is severe facet arthropathy at L4-L5 and L5-S1, slightly worse on the right. There are associated trace bilateral facet joint effusions. L1-L2: Status post posterior decompression. There is a broad-based central disc protrusion, degenerative endplate change, and bilateral facet arthropathy resulting in mild spinal canal stenosis with effacement of the left subarticular zone and suspected impingement of the traversing left L2 nerve root without significant neural foraminal stenosis. L2-L3: Status post posterior decompression. There is degenerative endplate change and bilateral facet arthropathy resulting in mild left and no significant right neural foraminal stenosis and no significant spinal canal stenosis. L3-L4: Status post posterior decompression. There is a mild disc bulge, degenerative endplate change, and bilateral facet arthropathy resulting in mild narrowing of the subarticular zones, left worse than right, without evidence of frank nerve root impingement, and mild right worse than left neural foraminal stenosis. L4-L5: There is prominent endplate spurring and bulky bilateral facet arthropathy resulting in moderate to severe spinal canal stenosis with effacement  of the subarticular zones and possible impingement of the traversing L5 nerve roots, and moderate left and severe right neural foraminal stenosis with impingement of the exiting right L4 nerve root. L5-S1: There is a diffuse disc  bulge, degenerative endplate change, and bilateral facet arthropathy resulting in severe left worse than right with impingement of the exiting L5 nerve roots. IMPRESSION: CERVICAL SPINE MRI: 1. Evaluation of the neural foramina is suboptimal due to poor signal on the axial T2 images. Within this confine: 2. Multilevel degenerative changes throughout the cervical spine as detailed above resulting in up to mild-to-moderate spinal canal stenosis at C4-C5 and C6-C7 and severe bilateral neural foraminal stenosis at C3-C4 through C6-C7. 3. No evidence of cord compression or cord signal abnormality. THORACIC SPINE MRI: 1. Large disc protrusion/extrusion at T10-T11 resulting in moderate spinal canal stenosis with mild indentation of the cord and mild bilateral neural foraminal stenosis. 2. Numerous additional disc protrusions throughout the remainder of the thoracic spine as detailed above without other high-grade spinal canal stenosis. No high-grade neural foraminal stenosis or evidence of nerve root impingement in the thoracic spine. 3. 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 4. No cord signal abnormality. LUMBAR SPINE MRI: 1. Postsurgical changes reflecting posterior decompression at L1-L2 through L3-L4. No high-grade spinal canal stenosis at the surgical levels, but there is effacement of the left subarticular zone at L1-L2 with possible impingement of the traversing left L2 nerve root. 2. 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 3. Severe left worse than right neural foraminal stenosis at L5-S1 with impingement of the exiting L5 nerve roots. 4. Stepwise grade 1 retrolisthesis of L1 on L2 through L4 on L5, likely degenerative in nature. 5. Fusion of the L1  through L3 vertebral bodies. 6. Severe facet arthropathy at L4-L5 and L5-S1, slightly worse on the right, with associated trace facet joint effusions. Electronically Signed   By: Valetta Mole M.D.   On: 04/11/2021 17:37   MR LUMBAR SPINE WO CONTRAST  Result Date: 04/11/2021 CLINICAL DATA:  Incontinence, numbness, weakness. Numbness in hands starting yesterday, no control of urine or bowel movements, unable to walk, no feeling in legs EXAM: MRI CERVICAL, THORACIC AND LUMBAR SPINE WITHOUT CONTRAST TECHNIQUE: Multiplanar and multiecho pulse sequences of the cervical spine, to include the craniocervical junction and cervicothoracic junction, and thoracic and lumbar spine, were obtained without intravenous contrast. COMPARISON:  None. FINDINGS: MRI CERVICAL SPINE FINDINGS Alignment: There is straightening of the normal cervical spine lordosis. There is no antero or retrolisthesis. Vertebrae: Vertebral body heights are preserved. There is no suspicious marrow signal abnormality. There is no marrow edema. Cord: Normal in signal and morphology. Posterior Fossa, vertebral arteries, paraspinal tissues: The imaged posterior fossa is unremarkable. The vertebral artery flow voids are present. The paraspinal soft tissues are unremarkable. Disc levels: Evaluation of the neural foramina is suboptimal due to poor signal on the axial T2 images. There is multilevel disc desiccation and narrowing, most advanced at C4-C5 and C5-C6. There is multilevel facet arthropathy, overall left worse than right. C2-C3: Mild uncovertebral and bilateral facet arthropathy result in mild left and no significant right neural foraminal stenosis without significant spinal canal stenosis C3-C4: There is a posterior disc osteophyte complex and uncovertebral and bilateral facet arthropathy resulting in severe left worse than  right neural foraminal stenosis and mild spinal canal stenosis. C4-C5: There is a posterior disc osteophyte complex and  uncovertebral and bilateral facet arthropathy resulting in severe left worse than right neural foraminal stenosis and mild to moderate spinal canal stenosis. C5-C6: There is a posterior disc osteophyte complex and uncovertebral and bilateral facet arthropathy resulting in severe bilateral neural foraminal stenosis and mild spinal canal stenosis. C6-C7: There is a posterior disc osteophyte complex and uncovertebral and bilateral facet arthropathy resulting in severe bilateral neural foraminal stenosis and mild-to-moderate spinal canal stenosis. C7-T1: There is a mild disc bulge resulting in mild right and no significant left neural foraminal stenosis without significant spinal canal stenosis. MRI THORACIC SPINE FINDINGS Alignment:  Normal. Vertebrae: Vertebral body heights are preserved. There is partial fusion of the T5 and T6 vertebral bodies. Cord:  Normal in signal and morphology. Paraspinal and other soft tissues: Unremarkable. Disc levels: There is multilevel disc desiccation and narrowing throughout the thoracic spine. There are bulky flowing anterior endplate osteophytes throughout the mid and lower thoracic spine consistent with diffuse idiopathic skeletal hyperostosis. T2-T3: There is a mild disc protrusion without significant spinal canal or neural foraminal stenosis. T3-T4: There is a small right paracentral disc protrusion without significant spinal canal or neural foraminal stenosis. T4-T5: There is a small left paracentral disc protrusion without significant spinal canal or neural foraminal stenosis. T6-T7: There is a small central protrusion resulting in mild spinal canal narrowing without significant neural foraminal stenosis. T7-T8: There is a small left paracentral disc protrusion without significant spinal canal or neural foraminal stenosis. T8-T9: There is a prominent central protrusion resulting in mild spinal canal stenosis with effacement of the ventral thecal sac but no mass effect on the  cord, and no significant neural foraminal stenosis. T9-T10: There is a small central protrusion without significant spinal canal or neural foraminal stenosis. T10-T11: There is a large disc protrusion/extrusion and bilateral facet arthropathy resulting in moderate spinal canal stenosis with effacement of the ventral thecal sac and mild indentation of the cord and mild bilateral neural foraminal stenosis. T11-T12: There is a prominent central protrusion resulting in mild spinal canal stenosis with effacement of the ventral thecal sac without significant neural foraminal stenosis. T12-L1: There is a prominent central protrusion/extrusion resulting in mild-to-moderate spinal canal stenosis without significant neural foraminal stenosis. MRI LUMBAR SPINE FINDINGS Segmentation:  Standard. Alignment: There is dextroscoliosis of the lumbar spine centered at L2-L3. There is grade 1 retrolisthesis of L1 on L2, L2 on L3, L3 on L4, and L4 on L5, all likely degenerative in nature. Vertebrae: There is fusion of the L1 through L3 vertebral bodies. Marrow signal is mildly heterogeneous with degenerative signal abnormality at L1 through L3 and about the L4-L5 disc space. There is no suspicious marrow signal abnormality. Postsurgical changes are noted reflecting posterior decompression at L1-L2 through L3-L4. Conus medullaris and cauda equina: Conus extends to the mid L1 level. Conus and cauda equina appear normal. Paraspinal and other soft tissues: There is atrophy of the right psoas muscle. Disc levels: There is obliteration of the disc spaces at L1-L2 and L2-L3. There is advanced disc space narrowing and desiccation throughout the remainder of the lumbar spine. There is severe facet arthropathy at L4-L5 and L5-S1, slightly worse on the right. There are associated trace bilateral facet joint effusions. L1-L2: Status post posterior decompression. There is a broad-based central disc protrusion, degenerative endplate change, and  bilateral facet arthropathy resulting in mild spinal canal stenosis with effacement of the left  subarticular zone and suspected impingement of the traversing left L2 nerve root without significant neural foraminal stenosis. L2-L3: Status post posterior decompression. There is degenerative endplate change and bilateral facet arthropathy resulting in mild left and no significant right neural foraminal stenosis and no significant spinal canal stenosis. L3-L4: Status post posterior decompression. There is a mild disc bulge, degenerative endplate change, and bilateral facet arthropathy resulting in mild narrowing of the subarticular zones, left worse than right, without evidence of frank nerve root impingement, and mild right worse than left neural foraminal stenosis. L4-L5: There is prominent endplate spurring and bulky bilateral facet arthropathy resulting in moderate to severe spinal canal stenosis with effacement of the subarticular zones and possible impingement of the traversing L5 nerve roots, and moderate left and severe right neural foraminal stenosis with impingement of the exiting right L4 nerve root. L5-S1: There is a diffuse disc bulge, degenerative endplate change, and bilateral facet arthropathy resulting in severe left worse than right with impingement of the exiting L5 nerve roots. IMPRESSION: CERVICAL SPINE MRI: 1. Evaluation of the neural foramina is suboptimal due to poor signal on the axial T2 images. Within this confine: 2. Multilevel degenerative changes throughout the cervical spine as detailed above resulting in up to mild-to-moderate spinal canal stenosis at C4-C5 and C6-C7 and severe bilateral neural foraminal stenosis at C3-C4 through C6-C7. 3. No evidence of cord compression or cord signal abnormality. THORACIC SPINE MRI: 1. Large disc protrusion/extrusion at T10-T11 resulting in moderate spinal canal stenosis with mild indentation of the cord and mild bilateral neural foraminal stenosis. 2.  Numerous additional disc protrusions throughout the remainder of the thoracic spine as detailed above without other high-grade spinal canal stenosis. No high-grade neural foraminal stenosis or evidence of nerve root impingement in the thoracic spine. 3. 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 4. No cord signal abnormality. LUMBAR SPINE MRI: 1. Postsurgical changes reflecting posterior decompression at L1-L2 through L3-L4. No high-grade spinal canal stenosis at the surgical levels, but there is effacement of the left subarticular zone at L1-L2 with possible impingement of the traversing left L2 nerve root. 2. 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 3. Severe left worse than right neural foraminal stenosis at L5-S1 with impingement of the exiting L5 nerve roots. 4. Stepwise grade 1 retrolisthesis of L1 on L2 through L4 on L5, likely degenerative in nature. 5. Fusion of the L1 through L3 vertebral bodies. 6. Severe facet arthropathy at L4-L5 and L5-S1, slightly worse on the right, with associated trace facet joint effusions. Electronically Signed   By: Valetta Mole M.D.   On: 04/11/2021 17:37     EKG: Independently reviewed, with result as described above.    Assessment/Plan   Principal Problem:   Weakness of both lower extremities Active Problems:   BPH (benign prostatic hyperplasia)   Chronic back pain   RLS (restless legs syndrome)   Peripheral neuropathy    #) Weakness/numbness of the bilateral lower extremities: Patient presents reporting new onset weakness/numbness in the bilateral lower extremities, and symmetrical distribution, with an element of ascension of the symptoms, as further detailed above.  Does not appear to be associated  with acute ischemic CVA, while noting CT head showed no evidence of acute intracranial process, including no evidence of intracranial hemorrhage.  Underwent MRI of the cervical, thoracic, and lumbar spine, with ensuing neurosurgical consultation, as further detailed above, rendering perception of chronic findings, without radiographic evidence of objective acute findings, nor any evidence to suggest the need for urgent or emergent operative decompression of the cervical, thoracic, or lumbar spine, as further detailed above.  Additionally, on-call neurology consulted, as detailed above, with recommendation for IR consultation for fluoroscopic lumbar puncture for further evaluation, including that for Keppra given an element of potential extension of the above symptoms, as further detailed above.  Additionally, his consultations are associated with recommendations for prn and benzodiazepines for muscle spasm as well as prn pain control.  Plan: As needed Valium for muscle spasm, as above.  As needed IV fentanyl, as above.  Neurosurgery and neurology consulted, as further detailed above.  Consult placed with IR for fluoroscopic guided lumbar puncture, as further detailed above.  Repeat CMP and CBC in the morning.  Follow for result of serum B12 level as well as that a TSH, and hemoglobin A1c, as ordered/recommended by neurology, as further detailed above.  Continue outpatient gabapentin for known underlying peripheral polyneuropathy, as further detailed above.       #) Restless leg syndrome, documented history of such, on scheduled Mirapex as an outpatient.  Plan: Continue home Mirapex.       #) Benign Prostatic Hyperplasia:  documented h/o such; on tamsulosin as outpatient.   Plan: monitor strict I's & O's and daily weights. Repeat BMP in AM.  continue home tamsulosin.      #) Obstructive sleep apnea: Documented history of such, the patient reporting good compliance with home nocturnal  CPAP.  Plan: Consult placed with RT for provision of home nocturnal CPAP.     DVT prophylaxis: SCD's   Code Status: Full code Family Communication: none Disposition Plan: Per Rounding Team Consults called: Dr Christella Noa of neurosurgery as well as Dr. Rory Percy of neurology consulted, as further detailed above;  Admission status: Observation; med telemetry   PLEASE NOTE THAT DRAGON DICTATION SOFTWARE WAS USED IN THE CONSTRUCTION OF THIS NOTE.   [place] DO [facility]  From [place]   04/11/2021, 10:39 PM

## 2021-04-11 NOTE — ED Notes (Signed)
EDP MD at the bedside

## 2021-04-11 NOTE — ED Provider Notes (Addendum)
8:39 PM I have seen and evaluated the patient who I assumed care of following signout.  Patient is awake, alert, speaking clearly, moving all extremities.  Patient does have occasional spasm in his left leg, but has preserved distal reflexes in both lower extremities.  I reviewed his MRI results, discussed them with both our neurology and neurosurgery colleagues.  Patient will have neurosurgical consult in the ED.   Gerhard Munch, MD 04/11/21 2039 I discussed this case with our neurosurgical and neurologic colleagues.  We have reviewed the MRI results together, patient has had interventions including analgesics, steroids.  With ongoing weakness, patient will be admitted to the internal medicine team for ongoing monitoring, management with consultation from neurology, neurosurgery.    Gerhard Munch, MD 04/11/21 2232

## 2021-04-11 NOTE — ED Notes (Signed)
Pt son assisted RN sitting pt at the side of bed.

## 2021-04-11 NOTE — ED Notes (Signed)
Provided pt beverage 
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
## 2021-04-11 NOTE — Consult Note (Signed)
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 4. No cord signal abnormality. LUMBAR SPINE MRI: 1. Postsurgical changes reflecting posterior decompression at L1-L2 through L3-L4. No high-grade spinal canal stenosis at the surgical levels, but there is effacement of the left subarticular zone at L1-L2 with possible impingement of the traversing left L2 nerve root. 2. 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 3. Severe left worse than right neural foraminal stenosis at L5-S1 with impingement of the exiting L5 nerve roots. 4. Stepwise grade 1 retrolisthesis of L1 on L2 through L4 on L5, likely degenerative in nature. 5. Fusion of the L1 through L3 vertebral bodies. 6. Severe facet arthropathy at L4-L5 and L5-S1, slightly worse on the right, with associated trace facet joint effusions. Electronically Signed   By: Valetta Mole M.D.   On: 04/11/2021 17:37   MR LUMBAR SPINE WO CONTRAST  Result Date:  04/11/2021 CLINICAL DATA:  Incontinence, numbness, weakness. Numbness in hands starting yesterday, no control of urine or bowel movements, unable to walk, no feeling in legs EXAM: MRI CERVICAL, THORACIC AND LUMBAR SPINE WITHOUT CONTRAST TECHNIQUE: Multiplanar and multiecho pulse sequences of the cervical spine, to include the craniocervical junction and cervicothoracic junction, and thoracic and lumbar spine, were obtained without intravenous contrast. COMPARISON:  None. FINDINGS: MRI CERVICAL SPINE FINDINGS Alignment: There is straightening of the normal cervical spine lordosis. There is no antero  or retrolisthesis. Vertebrae: Vertebral body heights are preserved. There is no suspicious marrow signal abnormality. There is no marrow edema. Cord: Normal in signal and morphology. Posterior Fossa, vertebral arteries, paraspinal tissues: The imaged posterior fossa is unremarkable. The vertebral artery flow voids are present. The paraspinal soft tissues are unremarkable. Disc levels: Evaluation of the neural foramina is suboptimal due to poor signal on the axial T2 images. There is multilevel disc desiccation and narrowing, most advanced at C4-C5 and C5-C6. There is multilevel facet arthropathy, overall left worse than right. C2-C3: Mild uncovertebral and bilateral facet arthropathy result in mild left and no significant right neural foraminal stenosis without significant spinal canal stenosis C3-C4: There is a posterior disc osteophyte complex and uncovertebral and bilateral facet arthropathy resulting in severe left worse than right neural foraminal stenosis and mild spinal canal stenosis. C4-C5: There is a posterior disc osteophyte complex and uncovertebral and bilateral facet arthropathy resulting in severe left worse than right neural foraminal stenosis and mild to moderate spinal canal stenosis. C5-C6: There is a posterior disc osteophyte complex and uncovertebral and bilateral facet arthropathy resulting in severe  bilateral neural foraminal stenosis and mild spinal canal stenosis. C6-C7: There is a posterior disc osteophyte complex and uncovertebral and bilateral facet arthropathy resulting in severe bilateral neural foraminal stenosis and mild-to-moderate spinal canal stenosis. C7-T1: There is a mild disc bulge resulting in mild right and no significant left neural foraminal stenosis without significant spinal canal stenosis. MRI THORACIC SPINE FINDINGS Alignment:  Normal. Vertebrae: Vertebral body heights are preserved. There is partial fusion of the T5 and T6 vertebral bodies. Cord:  Normal in signal and morphology. Paraspinal and other soft tissues: Unremarkable. Disc levels: There is multilevel disc desiccation and narrowing throughout the thoracic spine. There are bulky flowing anterior endplate osteophytes throughout the mid and lower thoracic spine consistent with diffuse idiopathic skeletal hyperostosis. T2-T3: There is a mild disc protrusion without significant spinal canal or neural foraminal stenosis. T3-T4: There is a small right paracentral disc protrusion without significant spinal canal or neural foraminal stenosis. T4-T5: There is a small left paracentral disc protrusion without significant spinal canal or neural foraminal stenosis. T6-T7: There is a small central protrusion resulting in mild spinal canal narrowing without significant neural foraminal stenosis. T7-T8: There is a small left paracentral disc protrusion without significant spinal canal or neural foraminal stenosis. T8-T9: There is a prominent central protrusion resulting in mild spinal canal stenosis with effacement of the ventral thecal sac but no mass effect on the cord, and no significant neural foraminal stenosis. T9-T10: There is a small central protrusion without significant spinal canal or neural foraminal stenosis. T10-T11: There is a large disc protrusion/extrusion and bilateral facet arthropathy resulting in moderate spinal canal  stenosis with effacement of the ventral thecal sac and mild indentation of the cord and mild bilateral neural foraminal stenosis. T11-T12: There is a prominent central protrusion resulting in mild spinal canal stenosis with effacement of the ventral thecal sac without significant neural foraminal stenosis. T12-L1: There is a prominent central protrusion/extrusion resulting in mild-to-moderate spinal canal stenosis without significant neural foraminal stenosis. MRI LUMBAR SPINE FINDINGS Segmentation:  Standard. Alignment: There is dextroscoliosis of the lumbar spine centered at L2-L3. There is grade 1 retrolisthesis of L1 on L2, L2 on L3, L3 on L4, and L4 on L5, all likely degenerative in nature. Vertebrae: There is fusion of the L1 through L3 vertebral bodies. Marrow signal is mildly heterogeneous with degenerative signal abnormality at L1 through L3 and  about the L4-L5 disc space. There is no suspicious marrow signal abnormality. Postsurgical changes are noted reflecting posterior decompression at L1-L2 through L3-L4. Conus medullaris and cauda equina: Conus extends to the mid L1 level. Conus and cauda equina appear normal. Paraspinal and other soft tissues: There is atrophy of the right psoas muscle. Disc levels: There is obliteration of the disc spaces at L1-L2 and L2-L3. There is advanced disc space narrowing and desiccation throughout the remainder of the lumbar spine. There is severe facet arthropathy at L4-L5 and L5-S1, slightly worse on the right. There are associated trace bilateral facet joint effusions. L1-L2: Status post posterior decompression. There is a broad-based central disc protrusion, degenerative endplate change, and bilateral facet arthropathy resulting in mild spinal canal stenosis with effacement of the left subarticular zone and suspected impingement of the traversing left L2 nerve root without significant neural foraminal stenosis. L2-L3: Status post posterior decompression. There is  degenerative endplate change and bilateral facet arthropathy resulting in mild left and no significant right neural foraminal stenosis and no significant spinal canal stenosis. L3-L4: Status post posterior decompression. There is a mild disc bulge, degenerative endplate change, and bilateral facet arthropathy resulting in mild narrowing of the subarticular zones, left worse than right, without evidence of frank nerve root impingement, and mild right worse than left neural foraminal stenosis. L4-L5: There is prominent endplate spurring and bulky bilateral facet arthropathy resulting in moderate to severe spinal canal stenosis with effacement of the subarticular zones and possible impingement of the traversing L5 nerve roots, and moderate left and severe right neural foraminal stenosis with impingement of the exiting right L4 nerve root. L5-S1: There is a diffuse disc bulge, degenerative endplate change, and bilateral facet arthropathy resulting in severe left worse than right with impingement of the exiting L5 nerve roots. IMPRESSION: CERVICAL SPINE MRI: 1. Evaluation of the neural foramina is suboptimal due to poor signal on the axial T2 images. Within this confine: 2. Multilevel degenerative changes throughout the cervical spine as detailed above resulting in up to mild-to-moderate spinal canal stenosis at C4-C5 and C6-C7 and severe bilateral neural foraminal stenosis at C3-C4 through C6-C7. 3. No evidence of cord compression or cord signal abnormality. THORACIC SPINE MRI: 1. Large disc protrusion/extrusion at T10-T11 resulting in moderate spinal canal stenosis with mild indentation of the cord and mild bilateral neural foraminal stenosis. 2. Numerous additional disc protrusions throughout the remainder of the thoracic spine as detailed above without other high-grade spinal canal stenosis. No high-grade neural foraminal stenosis or evidence of nerve root impingement in the thoracic spine. 3. Flowing anterior  osteophytes in the mid and lower thoracic spine consistent with diffuse idiopathic skeletal hyperostosis. 4. No cord signal abnormality. LUMBAR SPINE MRI: 1. Postsurgical changes reflecting posterior decompression at L1-L2 through L3-L4. No high-grade spinal canal stenosis at the surgical levels, but there is effacement of the left subarticular zone at L1-L2 with possible impingement of the traversing left L2 nerve root. 2. 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 3. Severe left worse than right neural foraminal stenosis at L5-S1 with impingement of the exiting L5 nerve roots. 4. Stepwise grade 1 retrolisthesis of L1 on L2 through L4 on L5, likely degenerative in nature. 5. Fusion of the L1 through L3 vertebral bodies. 6. Severe facet arthropathy at L4-L5 and L5-S1, slightly worse on the right, with  associated trace facet joint effusions. Electronically Signed   By: Valetta Mole M.D.   On: 04/11/2021 17:37    Review of Systems  Constitutional: Negative.   HENT: Negative.    Eyes: Negative.   Respiratory: Negative.    Cardiovascular: Negative.   Gastrointestinal: Negative.   Endocrine: Negative.   Genitourinary:        Incontinent  Musculoskeletal:  Positive for back pain, gait problem, neck pain and neck stiffness.  Skin: Negative.   Allergic/Immunologic: Negative.   Neurological:  Positive for tremors and weakness.  Hematological: Negative.   Psychiatric/Behavioral: Negative.    Blood pressure (!) 149/99, pulse (!) 57, temperature 98.3 F (36.8 C), temperature source Oral, resp. rate 18, height 5\' 10"  (1.778 m), weight (!) 140.6 kg, SpO2 95 %. Physical Exam Constitutional:      General: He is not in acute distress.    Appearance: Normal appearance. He is obese. He is not ill-appearing.  HENT:     Head:  Normocephalic and atraumatic.     Right Ear: External ear normal.     Left Ear: External ear normal.     Nose: Nose normal.     Mouth/Throat:     Mouth: Mucous membranes are moist.     Pharynx: Oropharynx is clear.  Eyes:     General: No visual field deficit.    Extraocular Movements: Extraocular movements intact.     Conjunctiva/sclera: Conjunctivae normal.     Pupils: Pupils are equal, round, and reactive to light.  Cardiovascular:     Rate and Rhythm: Normal rate and regular rhythm.     Pulses: Normal pulses.     Heart sounds: Normal heart sounds.  Pulmonary:     Effort: Pulmonary effort is normal.  Abdominal:     General: Abdomen is flat.  Genitourinary:    Penis: Normal.      Rectum: Normal.     Comments: Normal rectal tone, able to bear down on finger Musculoskeletal:        General: No swelling or tenderness.     Cervical back: Rigidity present.  Skin:    General: Skin is warm and dry.  Neurological:     Mental Status: He is alert and oriented to person, place, and time.     Cranial Nerves: Cranial nerves 2-12 are intact. No cranial nerve deficit, dysarthria or facial asymmetry.     Sensory: No sensory deficit.     Motor: Weakness present.     Coordination: Coordination normal.     Comments: Patchy sensory exam. Able to discern pinprick and dull on the right side. Not as good on the right. Answers are inconsistent however during the exam. Lower extremities which he stated were insensate were sensitive and he could discern sharp from dull along with hot v cold in certain parts of his thigh. Distally from knee to ankle no consistent evidence of sharp v dull.  Proprioception present in the fingers,consistently In the feet he is inconsistent but is able to answer some correctly Strength is at least 4/5 in lower extremities as he can stand. Upper extremities all move well. Some antagonist activation when testing strength in the upper extremities.  No extremity is flaccid.   Normal cranial nerve examination   Muscle jerking observed in the left lower extremity.   Assessment/Plan: Tony Mcintosh is a 76 y.o. male With a puzzling history and exam. While he has severe stenosis present at [id number], this is certainly a chronic issue. It is  quite rare to have an acute onset of weakness and numbness due to lumbar stenosis. 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 There is no acute change in the cervical spine, nor is there cord signal present. I do not appreciate indications for urgent/emergent operative decompression of the cervical or lumbar spine. Clearly the lumbar region would cause no problems in the thoracic region or upper limbs. The cervical lesion would also cause problems in the upper extremities in addition to the lower extremities. The head CT did not show an acute lesion in the brain. Given the inconsistent exam and anatomical results I recommend a neurology consult tomorrow.  He should be admitted to facilitate his diagnosis, and his inability to ambulate at this time.  Recommend valium for muscle jerking Ashok Pall 04/11/2021, 10:08 PM

## 2021-04-11 NOTE — ED Notes (Signed)
RT at pt bedside applying CPAP.

## 2021-04-11 NOTE — ED Notes (Signed)
Pt son stated father is in pain. Notified EDP
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
## 2021-04-12 ENCOUNTER — Observation Stay (HOSPITAL_COMMUNITY): Payer: No Typology Code available for payment source

## 2021-04-12 ENCOUNTER — Encounter (HOSPITAL_COMMUNITY): Payer: Self-pay | Admitting: Internal Medicine

## 2021-04-12 DIAGNOSIS — I739 Peripheral vascular disease, unspecified: Secondary | ICD-10-CM | POA: Diagnosis not present

## 2021-04-12 DIAGNOSIS — M4804 Spinal stenosis, thoracic region: Secondary | ICD-10-CM | POA: Diagnosis present

## 2021-04-12 DIAGNOSIS — D696 Thrombocytopenia, unspecified: Secondary | ICD-10-CM | POA: Diagnosis present

## 2021-04-12 DIAGNOSIS — G610 Guillain-Barre syndrome: Secondary | ICD-10-CM | POA: Diagnosis not present

## 2021-04-12 DIAGNOSIS — G629 Polyneuropathy, unspecified: Secondary | ICD-10-CM | POA: Diagnosis present

## 2021-04-12 DIAGNOSIS — A0811 Acute gastroenteropathy due to Norwalk agent: Secondary | ICD-10-CM | POA: Diagnosis present

## 2021-04-12 DIAGNOSIS — R531 Weakness: Secondary | ICD-10-CM

## 2021-04-12 DIAGNOSIS — N400 Enlarged prostate without lower urinary tract symptoms: Secondary | ICD-10-CM | POA: Diagnosis present

## 2021-04-12 DIAGNOSIS — E559 Vitamin D deficiency, unspecified: Secondary | ICD-10-CM | POA: Diagnosis present

## 2021-04-12 DIAGNOSIS — G4733 Obstructive sleep apnea (adult) (pediatric): Secondary | ICD-10-CM | POA: Diagnosis not present

## 2021-04-12 DIAGNOSIS — Z6841 Body Mass Index (BMI) 40.0 and over, adult: Secondary | ICD-10-CM | POA: Diagnosis not present

## 2021-04-12 DIAGNOSIS — Z7952 Long term (current) use of systemic steroids: Secondary | ICD-10-CM | POA: Diagnosis not present

## 2021-04-12 DIAGNOSIS — Z823 Family history of stroke: Secondary | ICD-10-CM | POA: Diagnosis not present

## 2021-04-12 DIAGNOSIS — M62838 Other muscle spasm: Secondary | ICD-10-CM | POA: Diagnosis present

## 2021-04-12 DIAGNOSIS — R29898 Other symptoms and signs involving the musculoskeletal system: Secondary | ICD-10-CM | POA: Diagnosis not present

## 2021-04-12 DIAGNOSIS — Z79899 Other long term (current) drug therapy: Secondary | ICD-10-CM | POA: Diagnosis not present

## 2021-04-12 DIAGNOSIS — M48061 Spinal stenosis, lumbar region without neurogenic claudication: Secondary | ICD-10-CM | POA: Diagnosis present

## 2021-04-12 DIAGNOSIS — G8929 Other chronic pain: Secondary | ICD-10-CM | POA: Diagnosis present

## 2021-04-12 DIAGNOSIS — M4807 Spinal stenosis, lumbosacral region: Secondary | ICD-10-CM | POA: Diagnosis present

## 2021-04-12 DIAGNOSIS — D72829 Elevated white blood cell count, unspecified: Secondary | ICD-10-CM | POA: Diagnosis present

## 2021-04-12 DIAGNOSIS — S63602A Unspecified sprain of left thumb, initial encounter: Secondary | ICD-10-CM | POA: Diagnosis present

## 2021-04-12 DIAGNOSIS — M069 Rheumatoid arthritis, unspecified: Secondary | ICD-10-CM | POA: Diagnosis not present

## 2021-04-12 DIAGNOSIS — M549 Dorsalgia, unspecified: Secondary | ICD-10-CM | POA: Diagnosis present

## 2021-04-12 DIAGNOSIS — X58XXXA Exposure to other specified factors, initial encounter: Secondary | ICD-10-CM | POA: Diagnosis present

## 2021-04-12 DIAGNOSIS — G2581 Restless legs syndrome: Secondary | ICD-10-CM | POA: Diagnosis present

## 2021-04-12 DIAGNOSIS — K5900 Constipation, unspecified: Secondary | ICD-10-CM | POA: Diagnosis not present

## 2021-04-12 DIAGNOSIS — M25462 Effusion, left knee: Secondary | ICD-10-CM | POA: Diagnosis present

## 2021-04-12 DIAGNOSIS — Z20822 Contact with and (suspected) exposure to covid-19: Secondary | ICD-10-CM | POA: Diagnosis present

## 2021-04-12 DIAGNOSIS — M4802 Spinal stenosis, cervical region: Secondary | ICD-10-CM | POA: Diagnosis present

## 2021-04-12 DIAGNOSIS — R5381 Other malaise: Secondary | ICD-10-CM | POA: Diagnosis not present

## 2021-04-12 LAB — COMPREHENSIVE METABOLIC PANEL
ALT: 30 U/L (ref 0–44)
AST: 29 U/L (ref 15–41)
Albumin: 3.6 g/dL (ref 3.5–5.0)
Alkaline Phosphatase: 59 U/L (ref 38–126)
Anion gap: 12 (ref 5–15)
BUN: 17 mg/dL (ref 8–23)
CO2: 23 mmol/L (ref 22–32)
Calcium: 8.9 mg/dL (ref 8.9–10.3)
Chloride: 101 mmol/L (ref 98–111)
Creatinine, Ser: 0.9 mg/dL (ref 0.61–1.24)
GFR, Estimated: >60 mL/min (ref >60)
Glucose, Bld: 153 mg/dL — ABNORMAL HIGH (ref 70–99)
Potassium: 4.2 mmol/L (ref 3.5–5.1)
Sodium: 136 mmol/L (ref 135–145)
Total Bilirubin: 1.1 mg/dL (ref 0.3–1.2)
Total Protein: 6.7 g/dL (ref 6.5–8.1)

## 2021-04-12 LAB — CBC WITH DIFFERENTIAL/PLATELET
Abs Immature Granulocytes: 0.06 10*3/uL (ref 0.00–0.07)
Basophils Absolute: 0 10*3/uL (ref 0.0–0.1)
Basophils Relative: 0 %
Eosinophils Absolute: 0 10*3/uL (ref 0.0–0.5)
Eosinophils Relative: 0 %
HCT: 43.2 % (ref 39.0–52.0)
Hemoglobin: 14.5 g/dL (ref 13.0–17.0)
Immature Granulocytes: 1 %
Lymphocytes Relative: 7 %
Lymphs Abs: 0.5 10*3/uL — ABNORMAL LOW (ref 0.7–4.0)
MCH: 32.7 pg (ref 26.0–34.0)
MCHC: 33.6 g/dL (ref 30.0–36.0)
MCV: 97.3 fL (ref 80.0–100.0)
Monocytes Absolute: 0.1 10*3/uL (ref 0.1–1.0)
Monocytes Relative: 2 %
Neutro Abs: 6.1 10*3/uL (ref 1.7–7.7)
Neutrophils Relative %: 90 %
Platelets: 199 10*3/uL (ref 150–400)
RBC: 4.44 MIL/uL (ref 4.22–5.81)
RDW: 14.2 % (ref 11.5–15.5)
WBC: 6.8 10*3/uL (ref 4.0–10.5)
nRBC: 0 % (ref 0.0–0.2)

## 2021-04-12 LAB — CSF CELL COUNT WITH DIFFERENTIAL
RBC Count, CSF: 123 /mm3 — ABNORMAL HIGH
Tube #: 3
WBC, CSF: 0 /mm3 (ref 0–5)

## 2021-04-12 LAB — HEMOGLOBIN A1C
Hgb A1c MFr Bld: 6.3 % — ABNORMAL HIGH (ref 4.8–5.6)
Mean Plasma Glucose: 134.11 mg/dL

## 2021-04-12 LAB — PROTEIN, CSF: Total  Protein, CSF: 51 mg/dL — ABNORMAL HIGH (ref 15–45)

## 2021-04-12 LAB — MAGNESIUM: Magnesium: 2 mg/dL (ref 1.7–2.4)

## 2021-04-12 LAB — TSH: TSH: 1.065 u[IU]/mL (ref 0.350–4.500)

## 2021-04-12 LAB — GLUCOSE, CSF: Glucose, CSF: 95 mg/dL — ABNORMAL HIGH (ref 40–70)

## 2021-04-12 LAB — VITAMIN B12: Vitamin B-12: 402 pg/mL (ref 180–914)

## 2021-04-12 IMAGING — RF DG SPINAL PUNCT LUMBAR DIAG WITH FL CT GUIDANCE
2 series · 2 of 2 positions shown · non-contrast
Comparison: MRI of the lumbar spine [DATE].

CLINICAL DATA: Numbness of both lower extremities.

EXAM:
DIAGNOSTIC LUMBAR PUNCTURE UNDER FLUOROSCOPIC GUIDANCE

[Series 1: fluoro_iodine 2fps_bw · 0.17mm/px · 1 of 1 slices shown]
[im 1/1]
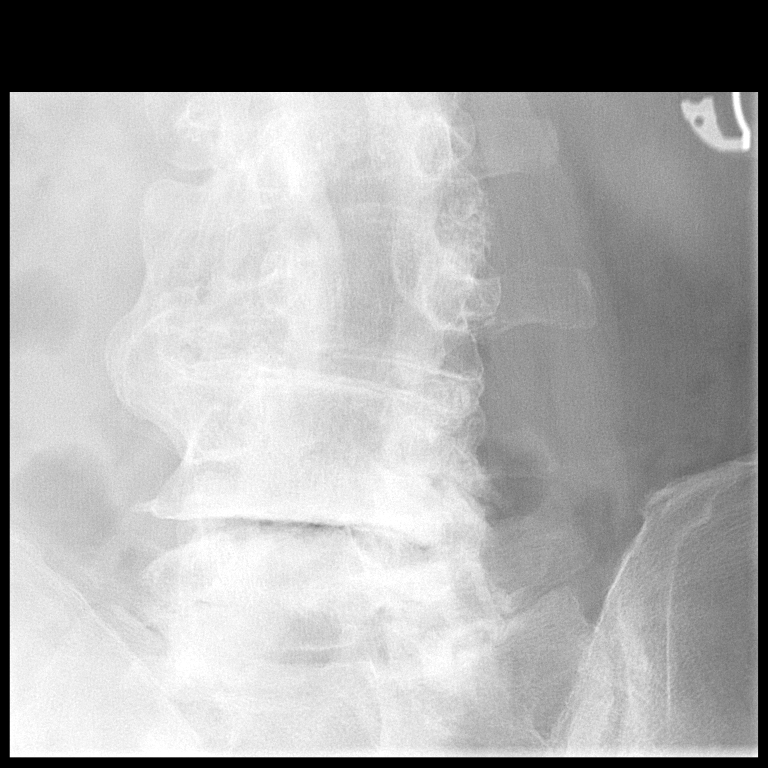

[Series 2: cp_standard · 0.17mm/px · 1 of 1 slices shown]
[im 1/1]
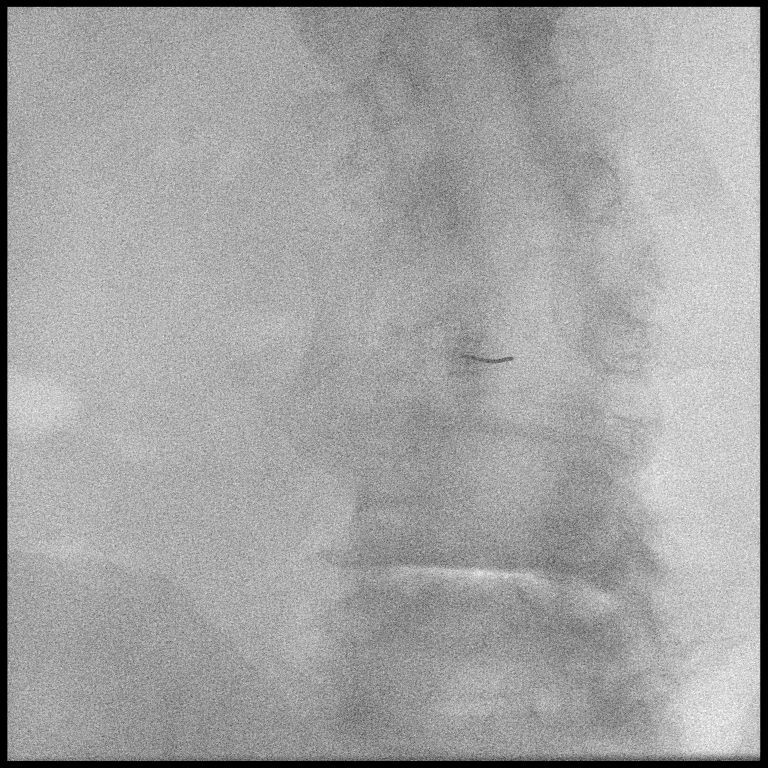

[2 of 2 positions shown; findings below may reference images not displayed]

FLUOROSCOPY TIME:  Fluoroscopy Time:  36 seconds

Radiation Exposure Index (if provided by the fluoroscopic device):
18.40 mGy

Number of Acquired Spot Images: 1

PROCEDURE:
Informed consent was obtained from the patient prior to the
procedure, which included a discussion of potential procedure risks
including but not limited to headache, allergic reaction, pain,
bleeding, infection and spinal cord/nerve root injury. With the
patient prone, the lower back was prepped with Betadine. 1%
Lidocaine was used for local anesthesia. Lumbar puncture was
performed via the L3 laminectomy defect using a 20 gauge needle with
return of clear CSF with an opening pressure of 12.5 cm water. 12 ml
of CSF were obtained for laboratory studies. The patient tolerated
the procedure well. No immediate post-procedure complication was
apparent.
IMPRESSION: Technically successful fluoroscopically-guided lumbar puncture via
an L3 laminectomy defect.

12 mL CSF obtained for laboratory studies.

No immediate post-procedure complication.

## 2021-04-12 MED ORDER — PREDNISONE 5 MG PO TABS
5.0000 mg | ORAL_TABLET | Freq: Every day | ORAL | Status: DC
  Filled 2021-04-12: qty 1

## 2021-04-12 MED ORDER — NALOXONE HCL 0.4 MG/ML IJ SOLN: 0.4000 mg | INTRAMUSCULAR | Status: DC | PRN

## 2021-04-12 MED ORDER — METHOTREXATE 2.5 MG PO TABS
17.5000 mg | ORAL_TABLET | ORAL | Status: DC
  Filled 2021-04-12: qty 7

## 2021-04-12 MED ORDER — DIAZEPAM 5 MG/ML IJ SOLN
5.0000 mg | Freq: Four times a day (QID) | INTRAMUSCULAR | Status: DC | PRN
  Administered 2021-04-12 – 2021-04-13 (×2): 5 mg via INTRAVENOUS
  Filled 2021-04-12 (×3): qty 2

## 2021-04-12 MED ORDER — CYANOCOBALAMIN 1000 MCG/ML IJ SOLN
1000.0000 ug | Freq: Every day | INTRAMUSCULAR | Status: DC
  Filled 2021-04-12 (×4): qty 1

## 2021-04-12 MED ORDER — TAMSULOSIN HCL 0.4 MG PO CAPS
0.4000 mg | ORAL_CAPSULE | Freq: Every day | ORAL | Status: DC
  Administered 2021-04-12 – 2021-04-18 (×7): 0.4 mg via ORAL
  Filled 2021-04-12 (×8): qty 1

## 2021-04-12 MED ORDER — IMMUNE GLOBULIN (HUMAN) 10 GM/100ML IV SOLN
400.0000 mg/kg | INTRAVENOUS | Status: AC
  Administered 2021-04-12 – 2021-04-16 (×5): 55 g via INTRAVENOUS
  Filled 2021-04-12: qty 50
  Filled 2021-04-12: qty 550
  Filled 2021-04-12: qty 400
  Filled 2021-04-12: qty 100
  Filled 2021-04-12: qty 400
  Filled 2021-04-12: qty 550
  Filled 2021-04-12: qty 400

## 2021-04-12 MED ORDER — GABAPENTIN 300 MG PO CAPS
300.0000 mg | ORAL_CAPSULE | Freq: Every day | ORAL | Status: DC
  Administered 2021-04-12 – 2021-04-19 (×8): 300 mg via ORAL
  Filled 2021-04-12 (×8): qty 1

## 2021-04-12 MED ORDER — PRAMIPEXOLE DIHYDROCHLORIDE 1 MG PO TABS
1.0000 mg | ORAL_TABLET | Freq: Two times a day (BID) | ORAL | Status: DC
  Administered 2021-04-12 – 2021-04-16 (×9): 1 mg via ORAL
  Filled 2021-04-12 (×2): qty 1
  Filled 2021-04-12 (×2): qty 8
  Filled 2021-04-12 (×2): qty 1
  Filled 2021-04-12: qty 8
  Filled 2021-04-12 (×4): qty 1
  Filled 2021-04-12: qty 8

## 2021-04-12 MED ORDER — B COMPLEX-C PO TABS
1.0000 | ORAL_TABLET | Freq: Two times a day (BID) | ORAL | Status: DC
  Administered 2021-04-13 – 2021-04-20 (×15): 1 via ORAL
  Filled 2021-04-12 (×18): qty 1

## 2021-04-12 MED ORDER — IMMUNE GLOBULIN (HUMAN) 10 GM/100ML IV SOLN: 400.0000 mg/kg | INTRAVENOUS | Status: DC

## 2021-04-12 MED ORDER — VIBEGRON 75 MG PO TABS: 75.0000 mg | ORAL_TABLET | Freq: Every day | ORAL | Status: DC

## 2021-04-12 MED ORDER — FOLIC ACID 1 MG PO TABS
1.0000 mg | ORAL_TABLET | Freq: Every day | ORAL | Status: DC
  Administered 2021-04-18 – 2021-04-20 (×3): 1 mg via ORAL
  Filled 2021-04-12 (×5): qty 1

## 2021-04-12 MED ORDER — FENTANYL CITRATE PF 50 MCG/ML IJ SOSY
50.0000 ug | PREFILLED_SYRINGE | INTRAMUSCULAR | Status: DC | PRN
  Administered 2021-04-12 – 2021-04-16 (×11): 50 ug via INTRAVENOUS
  Filled 2021-04-12 (×12): qty 1

## 2021-04-12 MED ORDER — B COMPLEX VITAMINS PO CAPS: 1.0000 | ORAL_CAPSULE | Freq: Two times a day (BID) | ORAL | Status: DC

## 2021-04-12 MED ORDER — LIDOCAINE HCL (PF) 1 % IJ SOLN
5.0000 mL | Freq: Once | INTRAMUSCULAR | Status: AC
  Administered 2021-04-12: 3 mL via INTRADERMAL

## 2021-04-12 MED ORDER — PREDNISONE 5 MG PO TABS
5.0000 mg | ORAL_TABLET | Freq: Every day | ORAL | Status: DC
  Administered 2021-04-13: 5 mg via ORAL
  Filled 2021-04-12: qty 1

## 2021-04-12 NOTE — Procedures (Signed)
Technically successful fluoroscopically-guided lumbar puncture via an L3 laminectomy defect.  12 mL CSF obtained for laboratory studies.  No immediate post-procedure complication.

## 2021-04-12 NOTE — ED Notes (Signed)
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 Pt states he takes 1mg  twice daily but took 2 pills (for total of 2 mg) since he missed his dose earlier today.

## 2021-04-12 NOTE — Progress Notes (Signed)
Progress Note    PANKAJ HOUCHEN  [id number] DOB: 08/30/1945  DOA: 04/11/2021 PCP: [facility]    Brief Narrative:     Medical records reviewed and are as summarized below:  DELL SANTELLAN is an 76 y.o. male with medical history significant for lumbar spinal stenosis, peripheral polyneuropathy, chronic back pain, restless leg syndrome, BPH, who is admitted to [facility] on 04/11/2021 for further evaluation and management of presenting numbness/weakness involving the bilateral lower extremities.   Assessment/Plan:   Principal Problem:   Weakness of both lower extremities Active Problems:   BPH (benign prostatic hyperplasia)   Chronic back pain   RLS (restless legs syndrome)   Peripheral neuropathy   OSA (obstructive sleep apnea)       Weakness/numbness of the bilateral lower extremities:  - new onset weakness/numbness in the bilateral lower extremities, and symmetrical distribution, with an element of ascension of the symptoms, as further detailed above.  Does not appear to be associated with acute ischemic CVA, while noting CT head showed no evidence of acute intracranial process, including no evidence of intracranial hemorrhage.   -Underwent MRI of the cervical, thoracic, and lumbar spine, with ensuing neurosurgical consultation, as further detailed above, rendering perception of chronic findings, without radiographic evidence of objective acute findings, nor any evidence to suggest the need for urgent or emergent operative decompression of the cervical, thoracic, or lumbar spine, as further detailed above.   -neurology consulted, as detailed above, with recommendation for IR consultation for fluoroscopic lumbar puncture for further evaluation, - As needed Valium for muscle spasm, as above.   - IR for fluoroscopic guided lumbar puncture, -Continue outpatient gabapentin for known underlying peripheral polyneuropathy, as further detailed above.    Restless leg syndrome, -on scheduled Mirapex as an outpatient.   Benign Prostatic Hyperplasia:   -on tamsulosin as outpatient.      Obstructive sleep apnea:  -nocturnal CPAP.  obesity Body mass index is 43.59 kg/m.   Family Communication/Anticipated D/C date and plan/Code Status   DVT prophylaxis: Lovenox ordered. Code Status: Full Code.  Disposition Plan: Status is: Observation  The patient will require care spanning > 2 midnights and should be moved to inpatient because: further work up        Medical Consultants:   NS neurology   Subjective:   Asking about when LP to be done  Objective:    Vitals:   04/12/21 0505 04/12/21 0645 04/12/21 0923 04/12/21 1234  BP: 121/66  (!) 144/85 122/74  Pulse: 67  74 62  Resp: 19  20 18   Temp: 97.7 F (36.5 C)  98.4 F (36.9 C) 97.7 F (36.5 C)  TempSrc:   Oral Oral  SpO2: 96%  95% 96%  Weight:  (!) 137.8 kg    Height:        Intake/Output Summary (Last 24 hours) at 04/12/2021 1250 Last data filed at 04/12/2021 1010 Gross per 24 hour  Intake 240 ml  Output 1050 ml  Net -810 ml   Filed Weights   04/11/21 1031 04/12/21 0645  Weight: (!) 140.6 kg (!) 137.8 kg    Exam:  General: Appearance:    Severely obese male in no acute distress     Lungs:     respirations unlabored  Heart:    Normal heart rate.    MS:   All extremities are intact.    Neurologic:   Awake, alert, oriented x 3.     Data  Reviewed:   I have personally reviewed following labs and imaging studies:  Labs: Labs show the following:   Basic Metabolic Panel: Recent Labs  Lab 04/11/21 0905 04/11/21 0943 04/12/21 0212  NA 133* 137 136  K 3.7 3.8 4.2  CL 97* 101 101  CO2 25  --  23  GLUCOSE 98 97 153*  BUN 17 20 17   CREATININE 0.99 0.90 0.90  CALCIUM 9.2  --  8.9  MG  --   --  2.0   GFR Estimated Creatinine Clearance: 97.7 mL/min (by C-G formula based on SCr of 0.9 mg/dL). Liver Function Tests: Recent Labs  Lab  04/11/21 0905 04/12/21 0212  AST 29 29  ALT 34 30  ALKPHOS 67 59  BILITOT 0.9 1.1  PROT 7.5 6.7  ALBUMIN 4.2 3.6   No results for input(s): LIPASE, AMYLASE in the last 168 hours. No results for input(s): AMMONIA in the last 168 hours. Coagulation profile Recent Labs  Lab 04/11/21 0905  INR 0.9    CBC: Recent Labs  Lab 04/11/21 0905 04/11/21 0943 04/12/21 0212  WBC 6.3  --  6.8  NEUTROABS 4.1  --  6.1  HGB 14.8 16.0 14.5  HCT 45.1 47.0 43.2  MCV 96.6  --  97.3  PLT 208  --  199   Cardiac Enzymes: No results for input(s): CKTOTAL, CKMB, CKMBINDEX, TROPONINI in the last 168 hours. BNP (last 3 results) No results for input(s): PROBNP in the last 8760 hours. CBG: Recent Labs  Lab 04/11/21 1114  GLUCAP 104*   D-Dimer: No results for input(s): DDIMER in the last 72 hours. Hgb A1c: Recent Labs    04/11/21 1115 04/12/21 0212  HGBA1C 6.3* 6.3*   Lipid Profile: No results for input(s): CHOL, HDL, LDLCALC, TRIG, CHOLHDL, LDLDIRECT in the last 72 hours. Thyroid function studies: Recent Labs    04/12/21 0212  TSH 1.065   Anemia work up: Recent Labs    04/12/21 0212  VITAMINB12 402   Sepsis Labs: Recent Labs  Lab 04/11/21 0905 04/12/21 0212  WBC 6.3 6.8    Microbiology Recent Results (from the past 240 hour(s))  Resp Panel by RT-PCR (Flu A&B, Covid) Nasopharyngeal Swab     Status: None   Collection Time: 04/11/21  8:04 PM   Specimen: Nasopharyngeal Swab; Nasopharyngeal(NP) swabs in vial transport medium  Result Value Ref Range Status   SARS Coronavirus 2 by RT PCR NEGATIVE NEGATIVE Final    Comment: (NOTE) SARS-CoV-2 target nucleic acids are NOT DETECTED.  The SARS-CoV-2 RNA is generally detectable in upper respiratory specimens during the acute phase of infection. The lowest concentration of SARS-CoV-2 viral copies this assay can detect is 138 copies/mL. A negative result does not preclude SARS-Cov-2 infection and should not be used as the sole  basis for treatment or other patient management decisions. A negative result may occur with  improper specimen collection/handling, submission of specimen other than nasopharyngeal swab, presence of viral mutation(s) within the areas targeted by this assay, and inadequate number of viral copies(<138 copies/mL). A negative result must be combined with clinical observations, patient history, and epidemiological information. The expected result is Negative.  Fact Sheet for Patients:  EntrepreneurPulse.com.au  Fact Sheet for Healthcare Providers:  IncredibleEmployment.be  This test is no t yet approved or cleared by the [place] FDA and  has been authorized for detection and/or diagnosis of SARS-CoV-2 by FDA under an Emergency Use Authorization (EUA). This EUA will remain  in effect (meaning this  test can be used) for the duration of the COVID-19 declaration under Section 564(b)(1) of the Act, 21 U.S.C.section 360bbb-3(b)(1), unless the authorization is terminated  or revoked sooner.       Influenza A by PCR NEGATIVE NEGATIVE Final   Influenza B by PCR NEGATIVE NEGATIVE Final    Comment: (NOTE) The Xpert Xpress SARS-CoV-2/FLU/RSV plus assay is intended as an aid in the diagnosis of influenza from Nasopharyngeal swab specimens and should not be used as a sole basis for treatment. Nasal washings and aspirates are unacceptable for Xpert Xpress SARS-CoV-2/FLU/RSV testing.  Fact Sheet for Patients: EntrepreneurPulse.com.au  Fact Sheet for Healthcare Providers: IncredibleEmployment.be  This test is not yet approved or cleared by the [place] FDA and has been authorized for detection and/or diagnosis of SARS-CoV-2 by FDA under an Emergency Use Authorization (EUA). This EUA will remain in effect (meaning this test can be used) for the duration of the COVID-19 declaration under Section 564(b)(1) of the Act,  21 U.S.C. section 360bbb-3(b)(1), unless the authorization is terminated or revoked.  Performed at [facility], [place] [address], [place], [place] [address]     Procedures and diagnostic studies:  CT HEAD WO CONTRAST  Result Date: 04/11/2021 CLINICAL DATA:  76 year old male with history of weakness and numbness. Paresthesia. EXAM: CT HEAD WITHOUT CONTRAST TECHNIQUE: Contiguous axial images were obtained from the base of the skull through the vertex without intravenous contrast. RADIATION DOSE REDUCTION: This exam was performed according to the departmental dose-optimization program which includes automated exposure control, adjustment of the mA and/or kV according to patient size and/or use of iterative reconstruction technique. COMPARISON:  No priors. FINDINGS: Brain: Mild cerebral atrophy. 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 No evidence of acute infarction, hemorrhage, hydrocephalus, extra-axial collection or mass lesion/mass effect. Vascular: No hyperdense vessel or unexpected calcification. Skull: Normal. Negative for fracture or focal lesion. Sinuses/Orbits: No acute finding. Small mucosal retention cyst or polyp in the posterior aspect of the right maxillary sinus incidentally noted. Other: None. IMPRESSION: 1. No acute intracranial abnormalities. 2. Very mild cerebral atrophy and mild chronic microvascular ischemic changes are noted in the cerebral white matter, as above. Electronically Signed   By: Vinnie Langton M.D.   On: 04/11/2021 10:04   MR Cervical Spine Wo Contrast  Result Date: 04/11/2021 CLINICAL DATA:  Incontinence, numbness, weakness. Numbness in hands starting yesterday, no control of urine or bowel movements, unable to walk, no feeling in legs EXAM: MRI CERVICAL, THORACIC AND LUMBAR SPINE WITHOUT CONTRAST  TECHNIQUE: Multiplanar and multiecho pulse sequences of the cervical spine, to include the craniocervical junction and cervicothoracic junction, and thoracic and lumbar spine, were obtained without intravenous contrast. COMPARISON:  None. FINDINGS: MRI CERVICAL SPINE FINDINGS Alignment: There is straightening of the normal cervical spine lordosis. There is no antero or retrolisthesis. Vertebrae: Vertebral body heights are preserved. There is no suspicious marrow signal abnormality. There is no marrow edema. Cord: Normal in signal and morphology. Posterior Fossa, vertebral arteries, paraspinal tissues: The imaged posterior fossa is unremarkable. The vertebral artery flow voids are present. The paraspinal soft tissues are unremarkable. Disc levels: Evaluation of the neural foramina is suboptimal due to poor signal on the axial T2 images. There is multilevel disc desiccation and narrowing, most advanced at C4-C5 and C5-C6. There is multilevel facet arthropathy, overall left worse than right. C2-C3: Mild uncovertebral and bilateral facet  arthropathy result in mild left and no significant right neural foraminal stenosis without significant spinal canal stenosis C3-C4: There is a posterior disc osteophyte complex and uncovertebral and bilateral facet arthropathy resulting in severe left worse than right neural foraminal stenosis and mild spinal canal stenosis. C4-C5: There is a posterior disc osteophyte complex and uncovertebral and bilateral facet arthropathy resulting in severe left worse than right neural foraminal stenosis and mild to moderate spinal canal stenosis. C5-C6: There is a posterior disc osteophyte complex and uncovertebral and bilateral facet arthropathy resulting in severe bilateral neural foraminal stenosis and mild spinal canal stenosis. C6-C7: There is a posterior disc osteophyte complex and uncovertebral and bilateral facet arthropathy resulting in severe bilateral neural foraminal stenosis and  mild-to-moderate spinal canal stenosis. C7-T1: There is a mild disc bulge resulting in mild right and no significant left neural foraminal stenosis without significant spinal canal stenosis. MRI THORACIC SPINE FINDINGS Alignment:  Normal. Vertebrae: Vertebral body heights are preserved. There is partial fusion of the T5 and T6 vertebral bodies. Cord:  Normal in signal and morphology. Paraspinal and other soft tissues: Unremarkable. Disc levels: There is multilevel disc desiccation and narrowing throughout the thoracic spine. There are bulky flowing anterior endplate osteophytes throughout the mid and lower thoracic spine consistent with diffuse idiopathic skeletal hyperostosis. T2-T3: There is a mild disc protrusion without significant spinal canal or neural foraminal stenosis. T3-T4: There is a small right paracentral disc protrusion without significant spinal canal or neural foraminal stenosis. T4-T5: There is a small left paracentral disc protrusion without significant spinal canal or neural foraminal stenosis. T6-T7: There is a small central protrusion resulting in mild spinal canal narrowing without significant neural foraminal stenosis. T7-T8: There is a small left paracentral disc protrusion without significant spinal canal or neural foraminal stenosis. T8-T9: There is a prominent central protrusion resulting in mild spinal canal stenosis with effacement of the ventral thecal sac but no mass effect on the cord, and no significant neural foraminal stenosis. T9-T10: There is a small central protrusion without significant spinal canal or neural foraminal stenosis. T10-T11: There is a large disc protrusion/extrusion and bilateral facet arthropathy resulting in moderate spinal canal stenosis with effacement of the ventral thecal sac and mild indentation of the cord and mild bilateral neural foraminal stenosis. T11-T12: There is a prominent central protrusion resulting in mild spinal canal stenosis with effacement  of the ventral thecal sac without significant neural foraminal stenosis. T12-L1: There is a prominent central protrusion/extrusion resulting in mild-to-moderate spinal canal stenosis without significant neural foraminal stenosis. MRI LUMBAR SPINE FINDINGS Segmentation:  Standard. Alignment: There is dextroscoliosis of the lumbar spine centered at L2-L3. There is grade 1 retrolisthesis of L1 on L2, L2 on L3, L3 on L4, and L4 on L5, all likely degenerative in nature. Vertebrae: There is fusion of the L1 through L3 vertebral bodies. Marrow signal is mildly heterogeneous with degenerative signal abnormality at L1 through L3 and about the L4-L5 disc space. There is no suspicious marrow signal abnormality. Postsurgical changes are noted reflecting posterior decompression at L1-L2 through L3-L4. Conus medullaris and cauda equina: Conus extends to the mid L1 level. Conus and cauda equina appear normal. Paraspinal and other soft tissues: There is atrophy of the right psoas muscle. Disc levels: There is obliteration of the disc spaces at L1-L2 and L2-L3. There is advanced disc space narrowing and desiccation throughout the remainder of the lumbar spine. There is severe facet arthropathy at L4-L5 and L5-S1, slightly worse on the right. There are  associated trace bilateral facet joint effusions. L1-L2: Status post posterior decompression. There is a broad-based central disc protrusion, degenerative endplate change, and bilateral facet arthropathy resulting in mild spinal canal stenosis with effacement of the left subarticular zone and suspected impingement of the traversing left L2 nerve root without significant neural foraminal stenosis. L2-L3: Status post posterior decompression. There is degenerative endplate change and bilateral facet arthropathy resulting in mild left and no significant right neural foraminal stenosis and no significant spinal canal stenosis. L3-L4: Status post posterior decompression. There is a mild disc  bulge, degenerative endplate change, and bilateral facet arthropathy resulting in mild narrowing of the subarticular zones, left worse than right, without evidence of frank nerve root impingement, and mild right worse than left neural foraminal stenosis. L4-L5: There is prominent endplate spurring and bulky bilateral facet arthropathy resulting in moderate to severe spinal canal stenosis with effacement of the subarticular zones and possible impingement of the traversing L5 nerve roots, and moderate left and severe right neural foraminal stenosis with impingement of the exiting right L4 nerve root. L5-S1: There is a diffuse disc bulge, degenerative endplate change, and bilateral facet arthropathy resulting in severe left worse than right with impingement of the exiting L5 nerve roots. IMPRESSION: CERVICAL SPINE MRI: 1. Evaluation of the neural foramina is suboptimal due to poor signal on the axial T2 images. Within this confine: 2. Multilevel degenerative changes throughout the cervical spine as detailed above resulting in up to mild-to-moderate spinal canal stenosis at C4-C5 and C6-C7 and severe bilateral neural foraminal stenosis at C3-C4 through C6-C7. 3. No evidence of cord compression or cord signal abnormality. THORACIC SPINE MRI: 1. Large disc protrusion/extrusion at T10-T11 resulting in moderate spinal canal stenosis with mild indentation of the cord and mild bilateral neural foraminal stenosis. 2. Numerous additional disc protrusions throughout the remainder of the thoracic spine as detailed above without other high-grade spinal canal stenosis. No high-grade neural foraminal stenosis or evidence of nerve root impingement in the thoracic spine. 3. 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 4. No cord signal abnormality. LUMBAR SPINE MRI: 1. Postsurgical changes reflecting posterior decompression at L1-L2 through L3-L4. No  high-grade spinal canal stenosis at the surgical levels, but there is effacement of the left subarticular zone at L1-L2 with possible impingement of the traversing left L2 nerve root. 2. 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 3. Severe left worse than right neural foraminal stenosis at L5-S1 with impingement of the exiting L5 nerve roots. 4. Stepwise grade 1 retrolisthesis of L1 on L2 through L4 on L5, likely degenerative in nature. 5. Fusion of the L1 through L3 vertebral bodies. 6. Severe facet arthropathy at L4-L5 and L5-S1, slightly worse on the right, with associated trace facet joint effusions. Electronically Signed   By: Valetta Mole M.D.   On: 04/11/2021 17:37   MR THORACIC SPINE WO CONTRAST  Result Date: 04/11/2021 CLINICAL DATA:  Incontinence, numbness, weakness. Numbness in hands starting yesterday, no control of urine or bowel movements, unable to walk, no feeling in legs EXAM: MRI CERVICAL, THORACIC AND LUMBAR SPINE WITHOUT CONTRAST TECHNIQUE: Multiplanar and multiecho pulse sequences of the cervical spine, to include the craniocervical junction and cervicothoracic junction, and thoracic and lumbar spine, were obtained without intravenous contrast. COMPARISON:  None. FINDINGS: MRI CERVICAL  SPINE FINDINGS Alignment: There is straightening of the normal cervical spine lordosis. There is no antero or retrolisthesis. Vertebrae: Vertebral body heights are preserved. There is no suspicious marrow signal abnormality. There is no marrow edema. Cord: Normal in signal and morphology. Posterior Fossa, vertebral arteries, paraspinal tissues: The imaged posterior fossa is unremarkable. The vertebral artery flow voids are present. The paraspinal soft tissues are unremarkable. Disc levels: Evaluation of the neural foramina is  suboptimal due to poor signal on the axial T2 images. There is multilevel disc desiccation and narrowing, most advanced at C4-C5 and C5-C6. There is multilevel facet arthropathy, overall left worse than right. C2-C3: Mild uncovertebral and bilateral facet arthropathy result in mild left and no significant right neural foraminal stenosis without significant spinal canal stenosis C3-C4: There is a posterior disc osteophyte complex and uncovertebral and bilateral facet arthropathy resulting in severe left worse than right neural foraminal stenosis and mild spinal canal stenosis. C4-C5: There is a posterior disc osteophyte complex and uncovertebral and bilateral facet arthropathy resulting in severe left worse than right neural foraminal stenosis and mild to moderate spinal canal stenosis. C5-C6: There is a posterior disc osteophyte complex and uncovertebral and bilateral facet arthropathy resulting in severe bilateral neural foraminal stenosis and mild spinal canal stenosis. C6-C7: There is a posterior disc osteophyte complex and uncovertebral and bilateral facet arthropathy resulting in severe bilateral neural foraminal stenosis and mild-to-moderate spinal canal stenosis. C7-T1: There is a mild disc bulge resulting in mild right and no significant left neural foraminal stenosis without significant spinal canal stenosis. MRI THORACIC SPINE FINDINGS Alignment:  Normal. Vertebrae: Vertebral body heights are preserved. There is partial fusion of the T5 and T6 vertebral bodies. Cord:  Normal in signal and morphology. Paraspinal and other soft tissues: Unremarkable. Disc levels: There is multilevel disc desiccation and narrowing throughout the thoracic spine. There are bulky flowing anterior endplate osteophytes throughout the mid and lower thoracic spine consistent with diffuse idiopathic skeletal hyperostosis. T2-T3: There is a mild disc protrusion without significant spinal canal or neural foraminal stenosis. T3-T4:  There is a small right paracentral disc protrusion without significant spinal canal or neural foraminal stenosis. T4-T5: There is a small left paracentral disc protrusion without significant spinal canal or neural foraminal stenosis. T6-T7: There is a small central protrusion resulting in mild spinal canal narrowing without significant neural foraminal stenosis. T7-T8: There is a small left paracentral disc protrusion without significant spinal canal or neural foraminal stenosis. T8-T9: There is a prominent central protrusion resulting in mild spinal canal stenosis with effacement of the ventral thecal sac but no mass effect on the cord, and no significant neural foraminal stenosis. T9-T10: There is a small central protrusion without significant spinal canal or neural foraminal stenosis. T10-T11: There is a large disc protrusion/extrusion and bilateral facet arthropathy resulting in moderate spinal canal stenosis with effacement of the ventral thecal sac and mild indentation of the cord and mild bilateral neural foraminal stenosis. T11-T12: There is a prominent central protrusion resulting in mild spinal canal stenosis with effacement of the ventral thecal sac without significant neural foraminal stenosis. T12-L1: There is a prominent central protrusion/extrusion resulting in mild-to-moderate spinal canal stenosis without significant neural foraminal stenosis. MRI LUMBAR SPINE FINDINGS Segmentation:  Standard. Alignment: There is dextroscoliosis of the lumbar spine centered at L2-L3. There is grade 1 retrolisthesis of L1 on L2, L2 on L3, L3 on L4, and L4 on L5, all likely degenerative in nature. Vertebrae: There is fusion of the L1 through L3  vertebral bodies. Marrow signal is mildly heterogeneous with degenerative signal abnormality at L1 through L3 and about the L4-L5 disc space. There is no suspicious marrow signal abnormality. Postsurgical changes are noted reflecting posterior decompression at L1-L2 through  L3-L4. Conus medullaris and cauda equina: Conus extends to the mid L1 level. Conus and cauda equina appear normal. Paraspinal and other soft tissues: There is atrophy of the right psoas muscle. Disc levels: There is obliteration of the disc spaces at L1-L2 and L2-L3. There is advanced disc space narrowing and desiccation throughout the remainder of the lumbar spine. There is severe facet arthropathy at L4-L5 and L5-S1, slightly worse on the right. There are associated trace bilateral facet joint effusions. L1-L2: Status post posterior decompression. There is a broad-based central disc protrusion, degenerative endplate change, and bilateral facet arthropathy resulting in mild spinal canal stenosis with effacement of the left subarticular zone and suspected impingement of the traversing left L2 nerve root without significant neural foraminal stenosis. L2-L3: Status post posterior decompression. There is degenerative endplate change and bilateral facet arthropathy resulting in mild left and no significant right neural foraminal stenosis and no significant spinal canal stenosis. L3-L4: Status post posterior decompression. There is a mild disc bulge, degenerative endplate change, and bilateral facet arthropathy resulting in mild narrowing of the subarticular zones, left worse than right, without evidence of frank nerve root impingement, and mild right worse than left neural foraminal stenosis. L4-L5: There is prominent endplate spurring and bulky bilateral facet arthropathy resulting in moderate to severe spinal canal stenosis with effacement of the subarticular zones and possible impingement of the traversing L5 nerve roots, and moderate left and severe right neural foraminal stenosis with impingement of the exiting right L4 nerve root. L5-S1: There is a diffuse disc bulge, degenerative endplate change, and bilateral facet arthropathy resulting in severe left worse than right with impingement of the exiting L5 nerve  roots. IMPRESSION: CERVICAL SPINE MRI: 1. Evaluation of the neural foramina is suboptimal due to poor signal on the axial T2 images. Within this confine: 2. Multilevel degenerative changes throughout the cervical spine as detailed above resulting in up to mild-to-moderate spinal canal stenosis at C4-C5 and C6-C7 and severe bilateral neural foraminal stenosis at C3-C4 through C6-C7. 3. No evidence of cord compression or cord signal abnormality. THORACIC SPINE MRI: 1. Large disc protrusion/extrusion at T10-T11 resulting in moderate spinal canal stenosis with mild indentation of the cord and mild bilateral neural foraminal stenosis. 2. Numerous additional disc protrusions throughout the remainder of the thoracic spine as detailed above without other high-grade spinal canal stenosis. No high-grade neural foraminal stenosis or evidence of nerve root impingement in the thoracic spine. 3. 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 4. No cord signal abnormality. LUMBAR SPINE MRI: 1. Postsurgical changes reflecting posterior decompression at L1-L2 through L3-L4. No high-grade spinal canal stenosis at the surgical levels, but there is effacement of the left subarticular zone at L1-L2 with possible impingement of the traversing left L2 nerve root. 2. 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 3. Severe left worse than right neural foraminal stenosis at L5-S1 with impingement of the exiting L5 nerve roots. 4. Stepwise grade 1 retrolisthesis of L1 on L2 through L4 on L5, likely degenerative in nature. 5. Fusion of the L1 through L3  vertebral bodies. 6. Severe facet arthropathy at L4-L5 and L5-S1, slightly worse on the right, with associated trace facet joint effusions.  Electronically Signed   By: Valetta Mole M.D.   On: 04/11/2021 17:37   MR LUMBAR SPINE WO CONTRAST  Result Date: 04/11/2021 CLINICAL DATA:  Incontinence, numbness, weakness. Numbness in hands starting yesterday, no control of urine or bowel movements, unable to walk, no feeling in legs EXAM: MRI CERVICAL, THORACIC AND LUMBAR SPINE WITHOUT CONTRAST TECHNIQUE: Multiplanar and multiecho pulse sequences of the cervical spine, to include the craniocervical junction and cervicothoracic junction, and thoracic and lumbar spine, were obtained without intravenous contrast. COMPARISON:  None. FINDINGS: MRI CERVICAL SPINE FINDINGS Alignment: There is straightening of the normal cervical spine lordosis. There is no antero or retrolisthesis. Vertebrae: Vertebral body heights are preserved. There is no suspicious marrow signal abnormality. There is no marrow edema. Cord: Normal in signal and morphology. Posterior Fossa, vertebral arteries, paraspinal tissues: The imaged posterior fossa is unremarkable. The vertebral artery flow voids are present. The paraspinal soft tissues are unremarkable. Disc levels: Evaluation of the neural foramina is suboptimal due to poor signal on the axial T2 images. There is multilevel disc desiccation and narrowing, most advanced at C4-C5 and C5-C6. There is multilevel facet arthropathy, overall left worse than right. C2-C3: Mild uncovertebral and bilateral facet arthropathy result in mild left and no significant right neural foraminal stenosis without significant spinal canal stenosis C3-C4: There is a posterior disc osteophyte complex and uncovertebral and bilateral facet arthropathy resulting in severe left worse than right neural foraminal stenosis and mild spinal canal stenosis. C4-C5: There is a posterior disc osteophyte complex and uncovertebral and bilateral facet arthropathy resulting in severe left worse than right neural foraminal stenosis and mild to moderate spinal canal stenosis.  C5-C6: There is a posterior disc osteophyte complex and uncovertebral and bilateral facet arthropathy resulting in severe bilateral neural foraminal stenosis and mild spinal canal stenosis. C6-C7: There is a posterior disc osteophyte complex and uncovertebral and bilateral facet arthropathy resulting in severe bilateral neural foraminal stenosis and mild-to-moderate spinal canal stenosis. C7-T1: There is a mild disc bulge resulting in mild right and no significant left neural foraminal stenosis without significant spinal canal stenosis. MRI THORACIC SPINE FINDINGS Alignment:  Normal. Vertebrae: Vertebral body heights are preserved. There is partial fusion of the T5 and T6 vertebral bodies. Cord:  Normal in signal and morphology. Paraspinal and other soft tissues: Unremarkable. Disc levels: There is multilevel disc desiccation and narrowing throughout the thoracic spine. There are bulky flowing anterior endplate osteophytes throughout the mid and lower thoracic spine consistent with diffuse idiopathic skeletal hyperostosis. T2-T3: There is a mild disc protrusion without significant spinal canal or neural foraminal stenosis. T3-T4: There is a small right paracentral disc protrusion without significant spinal canal or neural foraminal stenosis. T4-T5: There is a small left paracentral disc protrusion without significant spinal canal or neural foraminal stenosis. T6-T7: There is a small central protrusion resulting in mild spinal canal narrowing without significant neural foraminal stenosis. T7-T8: There is a small left paracentral disc protrusion without significant spinal canal or neural foraminal stenosis. T8-T9: There is a prominent central protrusion resulting in mild spinal canal stenosis with effacement of the ventral thecal sac but no mass effect on the cord, and no significant neural foraminal stenosis. T9-T10: There is a small central protrusion without significant spinal canal or neural foraminal stenosis.  T10-T11: There is a large disc protrusion/extrusion and bilateral facet arthropathy resulting in moderate spinal  canal stenosis with effacement of the ventral thecal sac and mild indentation of the cord and mild bilateral neural foraminal stenosis. T11-T12: There is a prominent central protrusion resulting in mild spinal canal stenosis with effacement of the ventral thecal sac without significant neural foraminal stenosis. T12-L1: There is a prominent central protrusion/extrusion resulting in mild-to-moderate spinal canal stenosis without significant neural foraminal stenosis. MRI LUMBAR SPINE FINDINGS Segmentation:  Standard. Alignment: There is dextroscoliosis of the lumbar spine centered at L2-L3. There is grade 1 retrolisthesis of L1 on L2, L2 on L3, L3 on L4, and L4 on L5, all likely degenerative in nature. Vertebrae: There is fusion of the L1 through L3 vertebral bodies. Marrow signal is mildly heterogeneous with degenerative signal abnormality at L1 through L3 and about the L4-L5 disc space. There is no suspicious marrow signal abnormality. Postsurgical changes are noted reflecting posterior decompression at L1-L2 through L3-L4. Conus medullaris and cauda equina: Conus extends to the mid L1 level. Conus and cauda equina appear normal. Paraspinal and other soft tissues: There is atrophy of the right psoas muscle. Disc levels: There is obliteration of the disc spaces at L1-L2 and L2-L3. There is advanced disc space narrowing and desiccation throughout the remainder of the lumbar spine. There is severe facet arthropathy at L4-L5 and L5-S1, slightly worse on the right. There are associated trace bilateral facet joint effusions. L1-L2: Status post posterior decompression. There is a broad-based central disc protrusion, degenerative endplate change, and bilateral facet arthropathy resulting in mild spinal canal stenosis with effacement of the left subarticular zone and suspected impingement of the traversing left  L2 nerve root without significant neural foraminal stenosis. L2-L3: Status post posterior decompression. There is degenerative endplate change and bilateral facet arthropathy resulting in mild left and no significant right neural foraminal stenosis and no significant spinal canal stenosis. L3-L4: Status post posterior decompression. There is a mild disc bulge, degenerative endplate change, and bilateral facet arthropathy resulting in mild narrowing of the subarticular zones, left worse than right, without evidence of frank nerve root impingement, and mild right worse than left neural foraminal stenosis. L4-L5: There is prominent endplate spurring and bulky bilateral facet arthropathy resulting in moderate to severe spinal canal stenosis with effacement of the subarticular zones and possible impingement of the traversing L5 nerve roots, and moderate left and severe right neural foraminal stenosis with impingement of the exiting right L4 nerve root. L5-S1: There is a diffuse disc bulge, degenerative endplate change, and bilateral facet arthropathy resulting in severe left worse than right with impingement of the exiting L5 nerve roots. IMPRESSION: CERVICAL SPINE MRI: 1. Evaluation of the neural foramina is suboptimal due to poor signal on the axial T2 images. Within this confine: 2. Multilevel degenerative changes throughout the cervical spine as detailed above resulting in up to mild-to-moderate spinal canal stenosis at C4-C5 and C6-C7 and severe bilateral neural foraminal stenosis at C3-C4 through C6-C7. 3. No evidence of cord compression or cord signal abnormality. THORACIC SPINE MRI: 1. Large disc protrusion/extrusion at T10-T11 resulting in moderate spinal canal stenosis with mild indentation of the cord and mild bilateral neural foraminal stenosis. 2. Numerous additional disc protrusions throughout the remainder of the thoracic spine as detailed above without other high-grade spinal canal stenosis. No high-grade  neural foraminal stenosis or evidence of nerve root impingement in the thoracic spine. 3. 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 4. No cord signal abnormality. LUMBAR SPINE MRI: 1. Postsurgical changes  reflecting posterior decompression at L1-L2 through L3-L4. No high-grade spinal canal stenosis at the surgical levels, but there is effacement of the left subarticular zone at L1-L2 with possible impingement of the traversing left L2 nerve root. 2. 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 3. Severe left worse than right neural foraminal stenosis at L5-S1 with impingement of the exiting L5 nerve roots. 4. Stepwise grade 1 retrolisthesis of L1 on L2 through L4 on L5, likely degenerative in nature. 5. Fusion of the L1 through L3 vertebral bodies. 6. Severe facet arthropathy at L4-L5 and L5-S1, slightly worse on the right, with associated trace facet joint effusions. Electronically Signed   By: Valetta Mole M.D.   On: 04/11/2021 17:37   DG FL GUIDED LUMBAR PUNCTURE  Result Date: 04/12/2021 CLINICAL DATA:  Numbness of both lower extremities. EXAM: DIAGNOSTIC LUMBAR PUNCTURE UNDER FLUOROSCOPIC GUIDANCE COMPARISON:  MRI of the lumbar spine 04/11/2021. FLUOROSCOPY TIME:  Fluoroscopy Time:  36 seconds Radiation Exposure Index (if provided by the fluoroscopic device): 18.40 mGy Number of Acquired Spot Images: 1 PROCEDURE: Informed consent was obtained from the patient prior to the procedure, which included a discussion of potential procedure risks including but not limited to headache, allergic reaction, pain, bleeding, infection and spinal cord/nerve root injury. With the patient prone, the lower back was prepped with Betadine. 1% Lidocaine was used for local  anesthesia. Lumbar puncture was performed via the L3 laminectomy defect using a 20 gauge needle with return of clear CSF with an opening pressure of 12.5 cm water. 12 ml of CSF were obtained for laboratory studies. The patient tolerated the procedure well. No immediate post-procedure complication was apparent. IMPRESSION: Technically successful fluoroscopically-guided lumbar puncture via an L3 laminectomy defect. 12 mL CSF obtained for laboratory studies. No immediate post-procedure complication. Electronically Signed   By: Kellie Simmering D.O.   On: 04/12/2021 10:59    Medications:    cyanocobalamin  1,000 mcg Intramuscular QHS   gabapentin  300 mg Oral QHS   pramipexole  1 mg Oral BID   tamsulosin  0.4 mg Oral QHS   Continuous Infusions:   LOS: 0 days   Geradine Girt  [facility]   How to contact the [facility] Attending or Consulting provider [place] or covering provider during after hours [place], for this patient?  Check the care team in [facility] and look for a) attending/consulting [facility] provider listed and b) the [facility] team listed Log into [email or web address] and use [place]'s universal password to access. If you do not have the password, please contact the hospital operator. Locate the [facility] provider you are looking for under [facility] and page to a number that you can be directly reached. If you still have difficulty reaching the provider, please page the [facility] (Director on Call) for the Hospitalists listed on amion for assistance.  04/12/2021, 12:50 PM

## 2021-04-12 NOTE — Progress Notes (Signed)
Neurology Progress Note  S: He feels the same as yesterday, no improvement in strength or numbness. 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 He knows he needs to have a BM, but can not tell when he is finished, states his "sphincter" doesn't work. He thinks he is worse on the right. States this came on all of a sudden 2 days ago. No history of this type of numbness or weakness before. He says his feet feel like there is concrete around them. He has had Lumbar surgery and has MDs tell him his "back is a mess" before. He says when NP touches dorsal side of feet, it makes them tingle. He states he is numb from his toes to his chest.   Prior to this incident, son states he can ambulate with a cane, but only for about 5 minutes and he has to use his Psychologist, occupational.   PMHx: Back problems with past surgery at L4-5, multiple areas of foraminal stenosis with mild to moderate canal stenosis and osteophytes. OSA, RLS, gait abnormality (chronic)   O: Current vital signs: BP (!) 144/85 (BP Location: Left Arm) Comment: Nurse notified   Pulse 74    Temp 98.4 F (36.9 C) (Oral)    Resp 20    Ht 5\' 10"  (1.778 m)    Wt (!) 137.8 kg    SpO2 95%    BMI 43.59 kg/m  Vital signs in last 24 hours: Temp:  [97.7 F (36.5 C)-98.4 F (36.9 C)] 98.4 F (36.9 C) (01/12 0923) Pulse Rate:  [57-77] 74 (01/12 0923) Resp:  [11-22] 20 (01/12 0923) BP: (98-165)/(54-119) 144/85 (01/12 0923) SpO2:  [92 %-99 %] 95 % (01/12 0923) Weight:  [137.8 kg-140.6 kg] 137.8 kg (01/12 0645)  GENERAL: Chronically ill appearing elderly male. Awake and alert in NAD. HEENT: Normocephalic and atraumatic. LUNGS: Normal respiratory effort.  CV: RRR .  Ext: warm.  NEURO:  Mental Status: Alert  and oriented x 4. He follows all commands.  Speech/Language: speech is without aphasia or dysarthria.  Naming, repetition, fluency, and comprehension  intact. Cranial Nerves:  Tracks examiner around room. Sensation is intact and symmetric to V1, V2, V3. EOMI. Smile is symmetrical. II: Visual fields full. Hearing intact to voice. Shoulder shrug 5/5.  Motor:  RUE: grip  4     bicep  4+      RLE: thigh 4  Abduction 4+  knee  5  plantar flexion 3   dorsiflexion 3 LUE: grip  5   brachioradialis  5/5 bicep   LLE: thigh 5  Abduction 5  knee  5  plantar flexion  3   dorsiflexion   3 There is give way weakness with UE exam and he trembles when he is trying to perform strength exam, R > L.  Tone is normal and bulk is normal. Sensation- Intact to light touch to UEs and LEs. No extinction to DSS. His light touch is impaired in the glove distribution on the left, but not on the right; states sensation is more dull in left glove distribution. He can feel light touch everywhere else, even in the dorsal feet bilaterally  DTRs:  RUE:  brachioradialis 1-2      biceps 1-2 RLE:  patella  0    LUE:  brachioradialis  2   biceps   LLE:  patella  0 Coordination: FTN intact bilaterally although dysmetria on left  and says he has to hold it up with left hand. Unable to lift LEs enough to perform HKS. He is able to lift his LEs off bed about 6 inches. .  Gait- Unable to assess due to severe weakness.  Medications  Current Facility-Administered Medications:    acetaminophen (TYLENOL) tablet 650 mg, 650 mg, Oral, Q6H PRN, 650 mg at 04/11/21 2345 **OR** acetaminophen (TYLENOL) suppository 650 mg, 650 mg, Rectal, Q6H PRN, Howerter, Justin B, DO   diazepam (VALIUM) injection 5 mg, 5 mg, Intravenous, Q6H PRN, Howerter, Justin B, DO   fentaNYL (SUBLIMAZE) injection 50 mcg, 50 mcg, Intravenous, Q2H PRN, Howerter, Justin B, DO, 50 mcg at 04/12/21 0617   gabapentin (NEURONTIN) capsule 300 mg, 300 mg, Oral, QHS, Howerter, Justin B, DO   lidocaine (PF) (XYLOCAINE) 1 % injection 5 mL, 5 mL, Intradermal, Once, Howerter, Justin B, DO   naloxone (NARCAN) injection 0.4 mg, 0.4 mg,  Intravenous, PRN, Howerter, Justin B, DO   pramipexole (MIRAPEX) tablet 1 mg, 1 mg, Oral, BID, Howerter, Justin B, DO   tamsulosin (FLOMAX) capsule 0.4 mg, 0.4 mg, Oral, QHS, Howerter, Justin B, DO  Pertinent Labs Glucose 153.  Vit B12 402.   LP pending.   Imaging MD has reviewed images in epic and the results pertinent to this consultation are:  MRI Spine.  CERVICAL SPINE MRI:  1. Evaluation of the neural foramina is suboptimal due to poor signal on the axial T2 images. Within this confine: 2. Multilevel degenerative changes throughout the cervical spine as detailed above resulting in up to mild-to-moderate spinal canal stenosis at C4-C5 and C6-C7 and severe bilateral neural foraminal stenosis at C3-C4 through C6-C7. 3. No evidence of cord compression or cord signal abnormality.   THORACIC SPINE MRI:  1. Large disc protrusion/extrusion at T10-T11 resulting in moderate spinal canal stenosis with mild indentation of the cord and mild bilateral neural foraminal stenosis. 2. Numerous additional disc protrusions throughout the remainder of the thoracic spine as detailed above without other high-grade spinal canal stenosis. No high-grade neural foraminal stenosis or evidence of nerve root impingement in the thoracic spine. 3. 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 4. No cord signal abnormality.   LUMBAR SPINE MRI:  1. Postsurgical changes reflecting posterior decompression at L1-L2 through L3-L4. No high-grade spinal canal stenosis at the surgical levels, but there is effacement of the left subarticular zone at L1-L2 with possible impingement of the traversing left L2 nerve root. 2. Moderate to severe spinal canal stenosis at L4-L5 with possible impingement of the traversing nerve roots, and moderate left and severe right neural foraminal stenosis with impingement of the exiting right L4 nerve  root. 3. Severe left worse than right neural foraminal stenosis at L5-S1 with impingement of the exiting L5 nerve roots. 4. Stepwise grade 1 retrolisthesis of L1 on L2 through L4 on L5, likely degenerative in nature. 5. Fusion of the L1 through L3 vertebral bodies. 6. Severe facet arthropathy at L4-L5 and L5-S1, slightly worse on the right, with associated trace facet joint effusions.    Assessment: 76 yo male who presented with weakness and numbness to feet and body to below shoulders. This started as an ascending pattern.  - His c/o BM and urinary incontinence may be associated with cauda equina syndrome, but imaging reviewed and there are no acute changes to support a cauda equina syndrome.  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  - Exam today reveals improvement in UE strength and LEs except for dorsi/plantar flexion.   Impression: - History of back pain and surgery with imaging negative for findings supportive of a cauda equina syndrome. There are chronic findings throughout the spine.  - Possibly some functionality on exam, including giveway weakness and trembling with strength exams.  - Areflexia LEs. 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 Awaiting LP results.   Recommendations:  - We will follow LP results.  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   - ? IVIG.  - B12 in 400s, given his symptoms, will supplement.  - Vit E and copper levels.  - If GBS, he will likely need short term rehab for a few weeks and maybe CIR.   Discussed with patient and his son.   Pt seen by Clance Boll, MSN, APN-BC/Nurse Practitioner/Neuro.  Pager: [phone]  Electronically signed: Dr. Kerney Elbe

## 2021-04-12 NOTE — Plan of Care (Signed)
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 NP went up to room to explain LP results and treatment for GBS. Spoke to patient about treatment with IVIG. Discussed small risk of blood clot and/or allergic reaction. He accepts these risks and agrees to blood product administration.   Answered questions about rehab and that it could be a slow process to recovery. That it could be recommended that he goes to short term rehab or even long term over 3 months.   Son, Thayer Ohm, was present. All questions were asked and answered.   Jimmye Norman, MSN, APN-BC Neurology Nurse Practitioner Pager [phone]

## 2021-04-13 LAB — FOLATE: Folate: 17.7 ng/mL (ref >5.9)

## 2021-04-13 MED ORDER — PREDNISONE 5 MG PO TABS
2.5000 mg | ORAL_TABLET | Freq: Every day | ORAL | Status: DC
  Administered 2021-04-14 – 2021-04-20 (×6): 2.5 mg via ORAL
  Filled 2021-04-13 (×7): qty 1

## 2021-04-13 MED ORDER — VIBEGRON 75 MG PO TABS
75.0000 mg | ORAL_TABLET | Freq: Every day | ORAL | Status: DC
  Administered 2021-04-13 – 2021-04-20 (×8): 75 mg via ORAL
  Filled 2021-04-13 (×11): qty 30

## 2021-04-13 NOTE — Progress Notes (Signed)
IVIG initiated by IV team.,communicated tyo RN on how to titrate the IVIG, verbalized understanding.

## 2021-04-13 NOTE — Progress Notes (Signed)
Inpatient Rehab Admissions Coordinator Note:   Per therapy recommendations patient was screened for CIR candidacy by Stephania Fragminaitlin E Kelty Szafran, PT. At this time, pt appears to be a potential candidate for CIR. I will place an order for rehab consult for full assessment, per our protocol.  Please contact me any with questions.Tony Mcintosh.  Mida Cory, PT, DPT [phone] 04/13/21 1:56 PM

## 2021-04-13 NOTE — Progress Notes (Signed)
Progress Note    Tony Mcintosh  [id number] DOB: [birth year]  DOA: 04/11/2021 PCP: [facility]    Brief Narrative:     Medical records reviewed and are as summarized below:  Tony Mcintosh is an 76 y.o. male with medical history significant for lumbar spinal stenosis, peripheral polyneuropathy, chronic back pain, restless leg syndrome, BPH, who is admitted to [facility] on 04/11/2021 for further evaluation and management of presenting numbness/weakness involving the bilateral lower extremities. s/p LP.  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  Plan for 5 days of IVIG.     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  Does not appear to be associated with acute ischemic CVA, while noting CT head showed no evidence of acute intracranial process, including no evidence of intracranial hemorrhage.   -Underwent MRI of the cervical, thoracic, and lumbar spine, with ensuing neurosurgical consultation, as further detailed above, rendering perception of chronic findings, without radiographic evidence of objective acute findings, nor any evidence to suggest the need for urgent or emergent operative decompression of the cervical, thoracic, or lumbar spine, as further detailed above.   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 -IVIG x 5 days -PT recommends SNF   Restless leg syndrome, -on scheduled Mirapex as an outpatient.   Benign Prostatic Hyperplasia:   -on tamsulosin/vibegron as outpatient.    Obstructive sleep apnea:  -nocturnal CPAP.  obesity Body mass index is 43.68 kg/m.  Arthritis -was started on prednisone and methotrexate -hold methotrexate for now -wean off prednisone and monitor symptoms -follows with Dr. Scarlette Shorts in [place] Communication/Anticipated D/C date and plan/Code Status   DVT prophylaxis: Lovenox ordered. Code Status: Full Code.  Disposition Plan: Status is: Observation  The patient will require care spanning > 2 midnights and should be moved to inpatient because: further work up        Medical Consultants:   NS neurology   Subjective:   Asking about when LP to be done  Objective:    Vitals:   04/12/21 2316 04/13/21 0009 04/13/21 0114 04/13/21 0547  BP: 131/87 135/80 109/67 123/80  Pulse: 91 62 69 67  Resp: 18 20 18 18   Temp: (!) 97 F (36.1 C) 98 F (36.7 C) 97.9 F (36.6 C) 97.9 F (36.6 C)  TempSrc: Oral Oral Oral Oral  SpO2: 97% 97% 97% 96%  Weight:    (!) 138.1 kg  Height:        Intake/Output Summary (Last 24 hours) at 04/13/2021 1358 Last data filed at 04/13/2021 0900 Gross per 24 hour  Intake 560 ml  Output 1250 ml  Net -690 ml   Filed Weights   04/12/21 0645 04/12/21 2020 04/13/21 0547  Weight: (!) 137.8 kg (!) 136.5 kg (!) 138.1 kg  Exam:   General: Appearance:    Severely obese male in no acute distress     Lungs:     respirations unlabored  Heart:    Normal heart rate.    MS:   All extremities are intact.    Neurologic:   Awake, alert, oriented x 3       Data Reviewed:   I have personally reviewed following labs and imaging studies:  Labs: Labs show the following:   Basic Metabolic  Panel: Recent Labs  Lab 04/11/21 0905 04/11/21 0943 04/12/21 0212  NA 133* 137 136  K 3.7 3.8 4.2  CL 97* 101 101  CO2 25  --  23  GLUCOSE 98 97 153*  BUN 17 20 17   CREATININE 0.99 0.90 0.90  CALCIUM 9.2  --  8.9  MG  --   --  2.0   GFR Estimated Creatinine Clearance: 97.8 mL/min (by C-G formula based on SCr of 0.9 mg/dL). Liver Function Tests: Recent Labs  Lab 04/11/21 0905 04/12/21 0212  AST 29 29  ALT 34 30  ALKPHOS 67 59  BILITOT 0.9 1.1  PROT 7.5 6.7  ALBUMIN 4.2 3.6   No results for input(s): LIPASE, AMYLASE in the last 168 hours. No results for input(s): AMMONIA in the last 168 hours. Coagulation profile Recent Labs  Lab 04/11/21 0905  INR 0.9    CBC: Recent Labs  Lab 04/11/21 0905 04/11/21 0943 04/12/21 0212  WBC 6.3  --  6.8  NEUTROABS 4.1  --  6.1  HGB 14.8 16.0 14.5  HCT 45.1 47.0 43.2  MCV 96.6  --  97.3  PLT 208  --  199   Cardiac Enzymes: No results for input(s): CKTOTAL, CKMB, CKMBINDEX, TROPONINI in the last 168 hours. BNP (last 3 results) No results for input(s): PROBNP in the last 8760 hours. CBG: Recent Labs  Lab 04/11/21 1114  GLUCAP 104*   D-Dimer: No results for input(s): DDIMER in the last 72 hours. Hgb A1c: Recent Labs    04/11/21 1115 04/12/21 0212  HGBA1C 6.3* 6.3*   Lipid Profile: No results for input(s): CHOL, HDL, LDLCALC, TRIG, CHOLHDL, LDLDIRECT in the last 72 hours. Thyroid function studies: Recent Labs    04/12/21 0212  TSH 1.065   Anemia work up: Recent Labs    04/12/21 0212 04/13/21 0805  VITAMINB12 402  --   FOLATE  --  17.7   Sepsis Labs: Recent Labs  Lab 04/11/21 0905 04/12/21 0212  WBC 6.3 6.8    Microbiology Recent Results (from the past 240 hour(s))  Resp Panel by RT-PCR (Flu A&B, Covid) Nasopharyngeal Swab     Status: None   Collection Time: 04/11/21  8:04 PM   Specimen: Nasopharyngeal Swab; Nasopharyngeal(NP) swabs in vial transport medium  Result Value Ref Range Status    SARS Coronavirus 2 by RT PCR NEGATIVE NEGATIVE Final    Comment: (NOTE) SARS-CoV-2 target nucleic acids are NOT DETECTED.  The SARS-CoV-2 RNA is generally detectable in upper respiratory specimens during the acute phase of infection. The lowest concentration of SARS-CoV-2 viral copies this assay can detect is 138 copies/mL. A negative result does not preclude SARS-Cov-2 infection and should not be used as the sole basis for treatment or other patient management decisions. A negative result may occur with  improper specimen collection/handling, submission of specimen other than nasopharyngeal swab, presence of viral mutation(s) within the areas targeted by this assay, and inadequate number of viral copies(<138 copies/mL). A negative result must  be combined with clinical observations, patient history, and epidemiological information. The expected result is Negative.  Fact Sheet for Patients:  EntrepreneurPulse.com.au  Fact Sheet for Healthcare Providers:  IncredibleEmployment.be  This test is no t yet approved or cleared by the [place] FDA and  has been authorized for detection and/or diagnosis of SARS-CoV-2 by FDA under an Emergency Use Authorization (EUA). This EUA will remain  in effect (meaning this test can be used) for the duration of the COVID-19 declaration under Section 564(b)(1) of the Act, 21 U.S.C.section 360bbb-3(b)(1), unless the authorization is terminated  or revoked sooner.       Influenza A by PCR NEGATIVE NEGATIVE Final   Influenza B by PCR NEGATIVE NEGATIVE Final    Comment: (NOTE) The Xpert Xpress SARS-CoV-2/FLU/RSV plus assay is intended as an aid in the diagnosis of influenza from Nasopharyngeal swab specimens and should not be used as a sole basis for treatment. Nasal washings and aspirates are unacceptable for Xpert Xpress SARS-CoV-2/FLU/RSV testing.  Fact Sheet for  Patients: EntrepreneurPulse.com.au  Fact Sheet for Healthcare Providers: IncredibleEmployment.be  This test is not yet approved or cleared by the [place] FDA and has been authorized for detection and/or diagnosis of SARS-CoV-2 by FDA under an Emergency Use Authorization (EUA). This EUA will remain in effect (meaning this test can be used) for the duration of the COVID-19 declaration under Section 564(b)(1) of the Act, 21 U.S.C. section 360bbb-3(b)(1), unless the authorization is terminated or revoked.  Performed at [facility], [place] [address], [place], [place] [address]     Procedures and diagnostic studies:  MR Cervical Spine Wo Contrast  Result Date: 04/11/2021 CLINICAL DATA:  Incontinence, numbness, weakness. Numbness in hands starting yesterday, no control of urine or bowel movements, unable to walk, no feeling in legs EXAM: MRI CERVICAL, THORACIC AND LUMBAR SPINE WITHOUT CONTRAST TECHNIQUE: Multiplanar and multiecho pulse sequences of the cervical spine, to include the craniocervical junction and cervicothoracic junction, and thoracic and lumbar spine, were obtained without intravenous contrast. COMPARISON:  None. FINDINGS: MRI CERVICAL SPINE FINDINGS Alignment: There is straightening of the normal cervical spine lordosis. There is no antero or retrolisthesis. Vertebrae: Vertebral body heights are preserved. There is no suspicious marrow signal abnormality. There is no marrow edema. Cord: Normal in signal and morphology. Posterior Fossa, vertebral arteries, paraspinal tissues: The imaged posterior fossa is unremarkable. The vertebral artery flow voids are present. The paraspinal soft tissues are unremarkable. Disc levels: Evaluation of the neural foramina is suboptimal due to poor signal on the axial T2 images. There is multilevel disc desiccation and narrowing, most advanced at C4-C5 and C5-C6. There is multilevel facet arthropathy, overall  left worse than right. C2-C3: Mild uncovertebral and bilateral facet arthropathy result in mild left and no significant right neural foraminal stenosis without significant spinal canal stenosis C3-C4: There is a posterior disc osteophyte complex and uncovertebral and bilateral facet arthropathy resulting in severe left worse than right neural foraminal stenosis and mild spinal canal stenosis. C4-C5: There is a posterior disc osteophyte complex and uncovertebral and bilateral facet arthropathy resulting in severe left worse than right neural foraminal stenosis and mild to moderate spinal canal stenosis. C5-C6: There is a posterior disc osteophyte complex and uncovertebral and bilateral facet arthropathy resulting in severe bilateral neural foraminal stenosis and mild spinal canal stenosis. C6-C7: There is a posterior disc osteophyte complex and uncovertebral and bilateral facet arthropathy resulting in severe bilateral neural foraminal stenosis and mild-to-moderate spinal canal stenosis. C7-T1: There is  a mild disc bulge resulting in mild right and no significant left neural foraminal stenosis without significant spinal canal stenosis. MRI THORACIC SPINE FINDINGS Alignment:  Normal. Vertebrae: Vertebral body heights are preserved. There is partial fusion of the T5 and T6 vertebral bodies. Cord:  Normal in signal and morphology. Paraspinal and other soft tissues: Unremarkable. Disc levels: There is multilevel disc desiccation and narrowing throughout the thoracic spine. There are bulky flowing anterior endplate osteophytes throughout the mid and lower thoracic spine consistent with diffuse idiopathic skeletal hyperostosis. T2-T3: There is a mild disc protrusion without significant spinal canal or neural foraminal stenosis. T3-T4: There is a small right paracentral disc protrusion without significant spinal canal or neural foraminal stenosis. T4-T5: There is a small left paracentral disc protrusion without significant  spinal canal or neural foraminal stenosis. T6-T7: There is a small central protrusion resulting in mild spinal canal narrowing without significant neural foraminal stenosis. T7-T8: There is a small left paracentral disc protrusion without significant spinal canal or neural foraminal stenosis. T8-T9: There is a prominent central protrusion resulting in mild spinal canal stenosis with effacement of the ventral thecal sac but no mass effect on the cord, and no significant neural foraminal stenosis. T9-T10: There is a small central protrusion without significant spinal canal or neural foraminal stenosis. T10-T11: There is a large disc protrusion/extrusion and bilateral facet arthropathy resulting in moderate spinal canal stenosis with effacement of the ventral thecal sac and mild indentation of the cord and mild bilateral neural foraminal stenosis. T11-T12: There is a prominent central protrusion resulting in mild spinal canal stenosis with effacement of the ventral thecal sac without significant neural foraminal stenosis. T12-L1: There is a prominent central protrusion/extrusion resulting in mild-to-moderate spinal canal stenosis without significant neural foraminal stenosis. MRI LUMBAR SPINE FINDINGS Segmentation:  Standard. Alignment: There is dextroscoliosis of the lumbar spine centered at L2-L3. There is grade 1 retrolisthesis of L1 on L2, L2 on L3, L3 on L4, and L4 on L5, all likely degenerative in nature. Vertebrae: There is fusion of the L1 through L3 vertebral bodies. Marrow signal is mildly heterogeneous with degenerative signal abnormality at L1 through L3 and about the L4-L5 disc space. There is no suspicious marrow signal abnormality. Postsurgical changes are noted reflecting posterior decompression at L1-L2 through L3-L4. Conus medullaris and cauda equina: Conus extends to the mid L1 level. Conus and cauda equina appear normal. Paraspinal and other soft tissues: There is atrophy of the right psoas muscle.  Disc levels: There is obliteration of the disc spaces at L1-L2 and L2-L3. There is advanced disc space narrowing and desiccation throughout the remainder of the lumbar spine. There is severe facet arthropathy at L4-L5 and L5-S1, slightly worse on the right. There are associated trace bilateral facet joint effusions. L1-L2: Status post posterior decompression. There is a broad-based central disc protrusion, degenerative endplate change, and bilateral facet arthropathy resulting in mild spinal canal stenosis with effacement of the left subarticular zone and suspected impingement of the traversing left L2 nerve root without significant neural foraminal stenosis. L2-L3: Status post posterior decompression. There is degenerative endplate change and bilateral facet arthropathy resulting in mild left and no significant right neural foraminal stenosis and no significant spinal canal stenosis. L3-L4: Status post posterior decompression. There is a mild disc bulge, degenerative endplate change, and bilateral facet arthropathy resulting in mild narrowing of the subarticular zones, left worse than right, without evidence of frank nerve root impingement, and mild right worse than left neural foraminal stenosis. L4-L5: There  is prominent endplate spurring and bulky bilateral facet arthropathy resulting in moderate to severe spinal canal stenosis with effacement of the subarticular zones and possible impingement of the traversing L5 nerve roots, and moderate left and severe right neural foraminal stenosis with impingement of the exiting right L4 nerve root. L5-S1: There is a diffuse disc bulge, degenerative endplate change, and bilateral facet arthropathy resulting in severe left worse than right with impingement of the exiting L5 nerve roots. IMPRESSION: CERVICAL SPINE MRI: 1. Evaluation of the neural foramina is suboptimal due to poor signal on the axial T2 images. Within this confine: 2. Multilevel degenerative changes  throughout the cervical spine as detailed above resulting in up to mild-to-moderate spinal canal stenosis at C4-C5 and C6-C7 and severe bilateral neural foraminal stenosis at C3-C4 through C6-C7. 3. No evidence of cord compression or cord signal abnormality. THORACIC SPINE MRI: 1. Large disc protrusion/extrusion at T10-T11 resulting in moderate spinal canal stenosis with mild indentation of the cord and mild bilateral neural foraminal stenosis. 2. Numerous additional disc protrusions throughout the remainder of the thoracic spine as detailed above without other high-grade spinal canal stenosis. No high-grade neural foraminal stenosis or evidence of nerve root impingement in the thoracic spine. 3. 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 4. No cord signal abnormality. LUMBAR SPINE MRI: 1. Postsurgical changes reflecting posterior decompression at L1-L2 through L3-L4. No high-grade spinal canal stenosis at the surgical levels, but there is effacement of the left subarticular zone at L1-L2 with possible impingement of the traversing left L2 nerve root. 2. 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 3. Severe left worse than right neural foraminal stenosis at L5-S1 with impingement of the exiting L5 nerve roots. 4. Stepwise grade 1 retrolisthesis of L1 on L2 through L4 on L5, likely degenerative in nature. 5. Fusion of the L1 through L3 vertebral bodies. 6. Severe facet arthropathy at L4-L5 and L5-S1, slightly worse on the right, with associated trace facet joint effusions. Electronically Signed   By: Valetta Mole M.D.   On: 04/11/2021 17:37   MR THORACIC SPINE WO CONTRAST  Result Date: 04/11/2021 CLINICAL DATA:  Incontinence, numbness, weakness. Numbness in  hands starting yesterday, no control of urine or bowel movements, unable to walk, no feeling in legs EXAM: MRI CERVICAL, THORACIC AND LUMBAR SPINE WITHOUT CONTRAST TECHNIQUE: Multiplanar and multiecho pulse sequences of the cervical spine, to include the craniocervical junction and cervicothoracic junction, and thoracic and lumbar spine, were obtained without intravenous contrast. COMPARISON:  None. FINDINGS: MRI CERVICAL SPINE FINDINGS Alignment: There is straightening of the normal cervical spine lordosis. There is no antero or retrolisthesis. Vertebrae: Vertebral body heights are preserved. There is no suspicious marrow signal abnormality. There is no marrow edema. Cord: Normal in signal and morphology. Posterior Fossa, vertebral arteries, paraspinal tissues: The imaged posterior fossa is unremarkable. The vertebral artery flow voids are present. The paraspinal soft tissues are unremarkable. Disc levels: Evaluation of the neural foramina is suboptimal due to poor signal on the axial T2 images. There is multilevel disc desiccation and narrowing, most advanced at C4-C5 and C5-C6. There is multilevel facet arthropathy, overall left worse than right. C2-C3: Mild uncovertebral and bilateral facet arthropathy result in mild left and no significant right neural foraminal stenosis without significant spinal canal stenosis C3-C4:  There is a posterior disc osteophyte complex and uncovertebral and bilateral facet arthropathy resulting in severe left worse than right neural foraminal stenosis and mild spinal canal stenosis. C4-C5: There is a posterior disc osteophyte complex and uncovertebral and bilateral facet arthropathy resulting in severe left worse than right neural foraminal stenosis and mild to moderate spinal canal stenosis. C5-C6: There is a posterior disc osteophyte complex and uncovertebral and bilateral facet arthropathy resulting in severe bilateral neural foraminal stenosis and mild spinal canal stenosis.  C6-C7: There is a posterior disc osteophyte complex and uncovertebral and bilateral facet arthropathy resulting in severe bilateral neural foraminal stenosis and mild-to-moderate spinal canal stenosis. C7-T1: There is a mild disc bulge resulting in mild right and no significant left neural foraminal stenosis without significant spinal canal stenosis. MRI THORACIC SPINE FINDINGS Alignment:  Normal. Vertebrae: Vertebral body heights are preserved. There is partial fusion of the T5 and T6 vertebral bodies. Cord:  Normal in signal and morphology. Paraspinal and other soft tissues: Unremarkable. Disc levels: There is multilevel disc desiccation and narrowing throughout the thoracic spine. There are bulky flowing anterior endplate osteophytes throughout the mid and lower thoracic spine consistent with diffuse idiopathic skeletal hyperostosis. T2-T3: There is a mild disc protrusion without significant spinal canal or neural foraminal stenosis. T3-T4: There is a small right paracentral disc protrusion without significant spinal canal or neural foraminal stenosis. T4-T5: There is a small left paracentral disc protrusion without significant spinal canal or neural foraminal stenosis. T6-T7: There is a small central protrusion resulting in mild spinal canal narrowing without significant neural foraminal stenosis. T7-T8: There is a small left paracentral disc protrusion without significant spinal canal or neural foraminal stenosis. T8-T9: There is a prominent central protrusion resulting in mild spinal canal stenosis with effacement of the ventral thecal sac but no mass effect on the cord, and no significant neural foraminal stenosis. T9-T10: There is a small central protrusion without significant spinal canal or neural foraminal stenosis. T10-T11: There is a large disc protrusion/extrusion and bilateral facet arthropathy resulting in moderate spinal canal stenosis with effacement of the ventral thecal sac and mild indentation  of the cord and mild bilateral neural foraminal stenosis. T11-T12: There is a prominent central protrusion resulting in mild spinal canal stenosis with effacement of the ventral thecal sac without significant neural foraminal stenosis. T12-L1: There is a prominent central protrusion/extrusion resulting in mild-to-moderate spinal canal stenosis without significant neural foraminal stenosis. MRI LUMBAR SPINE FINDINGS Segmentation:  Standard. Alignment: There is dextroscoliosis of the lumbar spine centered at L2-L3. There is grade 1 retrolisthesis of L1 on L2, L2 on L3, L3 on L4, and L4 on L5, all likely degenerative in nature. Vertebrae: There is fusion of the L1 through L3 vertebral bodies. Marrow signal is mildly heterogeneous with degenerative signal abnormality at L1 through L3 and about the L4-L5 disc space. There is no suspicious marrow signal abnormality. Postsurgical changes are noted reflecting posterior decompression at L1-L2 through L3-L4. Conus medullaris and cauda equina: Conus extends to the mid L1 level. Conus and cauda equina appear normal. Paraspinal and other soft tissues: There is atrophy of the right psoas muscle. Disc levels: There is obliteration of the disc spaces at L1-L2 and L2-L3. There is advanced disc space narrowing and desiccation throughout the remainder of the lumbar spine. There is severe facet arthropathy at L4-L5 and L5-S1, slightly worse on the right. There are associated trace bilateral facet joint effusions. L1-L2: Status post posterior decompression. There is a broad-based central disc protrusion,  degenerative endplate change, and bilateral facet arthropathy resulting in mild spinal canal stenosis with effacement of the left subarticular zone and suspected impingement of the traversing left L2 nerve root without significant neural foraminal stenosis. L2-L3: Status post posterior decompression. There is degenerative endplate change and bilateral facet arthropathy resulting in mild  left and no significant right neural foraminal stenosis and no significant spinal canal stenosis. L3-L4: Status post posterior decompression. There is a mild disc bulge, degenerative endplate change, and bilateral facet arthropathy resulting in mild narrowing of the subarticular zones, left worse than right, without evidence of frank nerve root impingement, and mild right worse than left neural foraminal stenosis. L4-L5: There is prominent endplate spurring and bulky bilateral facet arthropathy resulting in moderate to severe spinal canal stenosis with effacement of the subarticular zones and possible impingement of the traversing L5 nerve roots, and moderate left and severe right neural foraminal stenosis with impingement of the exiting right L4 nerve root. L5-S1: There is a diffuse disc bulge, degenerative endplate change, and bilateral facet arthropathy resulting in severe left worse than right with impingement of the exiting L5 nerve roots. IMPRESSION: CERVICAL SPINE MRI: 1. Evaluation of the neural foramina is suboptimal due to poor signal on the axial T2 images. Within this confine: 2. Multilevel degenerative changes throughout the cervical spine as detailed above resulting in up to mild-to-moderate spinal canal stenosis at C4-C5 and C6-C7 and severe bilateral neural foraminal stenosis at C3-C4 through C6-C7. 3. No evidence of cord compression or cord signal abnormality. THORACIC SPINE MRI: 1. Large disc protrusion/extrusion at T10-T11 resulting in moderate spinal canal stenosis with mild indentation of the cord and mild bilateral neural foraminal stenosis. 2. Numerous additional disc protrusions throughout the remainder of the thoracic spine as detailed above without other high-grade spinal canal stenosis. No high-grade neural foraminal stenosis or evidence of nerve root impingement in the thoracic spine. 3. Flowing anterior osteophytes in the mid and lower thoracic spine consistent with diffuse idiopathic  skeletal hyperostosis. 4. No cord signal abnormality. LUMBAR SPINE MRI: 1. Postsurgical changes reflecting posterior decompression at L1-L2 through L3-L4. No high-grade spinal canal stenosis at the surgical levels, but there is effacement of the left subarticular zone at L1-L2 with possible impingement of the traversing left L2 nerve root. 2. 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 3. Severe left worse than right neural foraminal stenosis at L5-S1 with impingement of the exiting L5 nerve roots. 4. Stepwise grade 1 retrolisthesis of L1 on L2 through L4 on L5, likely degenerative in nature. 5. Fusion of the L1 through L3 vertebral bodies. 6. Severe facet arthropathy at L4-L5 and L5-S1, slightly worse on the right, with associated trace facet joint effusions. Electronically Signed   By: Valetta Mole M.D.   On: 04/11/2021 17:37   MR LUMBAR SPINE WO CONTRAST  Result Date: 04/11/2021 CLINICAL DATA:  Incontinence, numbness, weakness. Numbness in hands starting yesterday, no control of urine or bowel movements, unable to walk, no feeling in legs EXAM: MRI CERVICAL, THORACIC AND LUMBAR SPINE WITHOUT CONTRAST TECHNIQUE: Multiplanar and multiecho pulse sequences of the cervical spine, to include the craniocervical junction and cervicothoracic junction, and thoracic and lumbar spine, were obtained without intravenous contrast. COMPARISON:  None. FINDINGS: MRI CERVICAL SPINE FINDINGS Alignment: There is straightening of the normal cervical spine lordosis. There is no antero or retrolisthesis.  Vertebrae: Vertebral body heights are preserved. There is no suspicious marrow signal abnormality. There is no marrow edema. Cord: Normal in signal and morphology. Posterior Fossa, vertebral arteries, paraspinal tissues: The imaged posterior fossa is  unremarkable. The vertebral artery flow voids are present. The paraspinal soft tissues are unremarkable. Disc levels: Evaluation of the neural foramina is suboptimal due to poor signal on the axial T2 images. There is multilevel disc desiccation and narrowing, most advanced at C4-C5 and C5-C6. There is multilevel facet arthropathy, overall left worse than right. C2-C3: Mild uncovertebral and bilateral facet arthropathy result in mild left and no significant right neural foraminal stenosis without significant spinal canal stenosis C3-C4: There is a posterior disc osteophyte complex and uncovertebral and bilateral facet arthropathy resulting in severe left worse than right neural foraminal stenosis and mild spinal canal stenosis. C4-C5: There is a posterior disc osteophyte complex and uncovertebral and bilateral facet arthropathy resulting in severe left worse than right neural foraminal stenosis and mild to moderate spinal canal stenosis. C5-C6: There is a posterior disc osteophyte complex and uncovertebral and bilateral facet arthropathy resulting in severe bilateral neural foraminal stenosis and mild spinal canal stenosis. C6-C7: There is a posterior disc osteophyte complex and uncovertebral and bilateral facet arthropathy resulting in severe bilateral neural foraminal stenosis and mild-to-moderate spinal canal stenosis. C7-T1: There is a mild disc bulge resulting in mild right and no significant left neural foraminal stenosis without significant spinal canal stenosis. MRI THORACIC SPINE FINDINGS Alignment:  Normal. Vertebrae: Vertebral body heights are preserved. There is partial fusion of the T5 and T6 vertebral bodies. Cord:  Normal in signal and morphology. Paraspinal and other soft tissues: Unremarkable. Disc levels: There is multilevel disc desiccation and narrowing throughout the thoracic spine. There are bulky flowing anterior endplate osteophytes throughout the mid and lower thoracic spine consistent with  diffuse idiopathic skeletal hyperostosis. T2-T3: There is a mild disc protrusion without significant spinal canal or neural foraminal stenosis. T3-T4: There is a small right paracentral disc protrusion without significant spinal canal or neural foraminal stenosis. T4-T5: There is a small left paracentral disc protrusion without significant spinal canal or neural foraminal stenosis. T6-T7: There is a small central protrusion resulting in mild spinal canal narrowing without significant neural foraminal stenosis. T7-T8: There is a small left paracentral disc protrusion without significant spinal canal or neural foraminal stenosis. T8-T9: There is a prominent central protrusion resulting in mild spinal canal stenosis with effacement of the ventral thecal sac but no mass effect on the cord, and no significant neural foraminal stenosis. T9-T10: There is a small central protrusion without significant spinal canal or neural foraminal stenosis. T10-T11: There is a large disc protrusion/extrusion and bilateral facet arthropathy resulting in moderate spinal canal stenosis with effacement of the ventral thecal sac and mild indentation of the cord and mild bilateral neural foraminal stenosis. T11-T12: There is a prominent central protrusion resulting in mild spinal canal stenosis with effacement of the ventral thecal sac without significant neural foraminal stenosis. T12-L1: There is a prominent central protrusion/extrusion resulting in mild-to-moderate spinal canal stenosis without significant neural foraminal stenosis. MRI LUMBAR SPINE FINDINGS Segmentation:  Standard. Alignment: There is dextroscoliosis of the lumbar spine centered at L2-L3. There is grade 1 retrolisthesis of L1 on L2, L2 on L3, L3 on L4, and L4 on L5, all likely degenerative in nature. Vertebrae: There is fusion of the L1 through L3 vertebral bodies. Marrow signal is mildly heterogeneous with degenerative signal abnormality at L1 through L3 and about the  L4-L5 disc space. There is no suspicious marrow signal abnormality. Postsurgical changes are noted reflecting posterior decompression at L1-L2 through L3-L4. Conus medullaris and cauda equina: Conus extends to the mid L1 level. Conus and cauda equina appear normal. Paraspinal and other soft tissues: There is atrophy of the right psoas muscle. Disc levels: There is obliteration of the disc spaces at L1-L2 and L2-L3. There is advanced disc space narrowing and desiccation throughout the remainder of the lumbar spine. There is severe facet arthropathy at L4-L5 and L5-S1, slightly worse on the right. There are associated trace bilateral facet joint effusions. L1-L2: Status post posterior decompression. There is a broad-based central disc protrusion, degenerative endplate change, and bilateral facet arthropathy resulting in mild spinal canal stenosis with effacement of the left subarticular zone and suspected impingement of the traversing left L2 nerve root without significant neural foraminal stenosis. L2-L3: Status post posterior decompression. There is degenerative endplate change and bilateral facet arthropathy resulting in mild left and no significant right neural foraminal stenosis and no significant spinal canal stenosis. L3-L4: Status post posterior decompression. There is a mild disc bulge, degenerative endplate change, and bilateral facet arthropathy resulting in mild narrowing of the subarticular zones, left worse than right, without evidence of frank nerve root impingement, and mild right worse than left neural foraminal stenosis. L4-L5: There is prominent endplate spurring and bulky bilateral facet arthropathy resulting in moderate to severe spinal canal stenosis with effacement of the subarticular zones and possible impingement of the traversing L5 nerve roots, and moderate left and severe right neural foraminal stenosis with impingement of the exiting right L4 nerve root. L5-S1: There is a diffuse disc  bulge, degenerative endplate change, and bilateral facet arthropathy resulting in severe left worse than right with impingement of the exiting L5 nerve roots. IMPRESSION: CERVICAL SPINE MRI: 1. Evaluation of the neural foramina is suboptimal due to poor signal on the axial T2 images. Within this confine: 2. Multilevel degenerative changes throughout the cervical spine as detailed above resulting in up to mild-to-moderate spinal canal stenosis at C4-C5 and C6-C7 and severe bilateral neural foraminal stenosis at C3-C4 through C6-C7. 3. No evidence of cord compression or cord signal abnormality. THORACIC SPINE MRI: 1. Large disc protrusion/extrusion at T10-T11 resulting in moderate spinal canal stenosis with mild indentation of the cord and mild bilateral neural foraminal stenosis. 2. Numerous additional disc protrusions throughout the remainder of the thoracic spine as detailed above without other high-grade spinal canal stenosis. No high-grade neural foraminal stenosis or evidence of nerve root impingement in the thoracic spine. 3. 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 4. No cord signal abnormality. LUMBAR SPINE MRI: 1. Postsurgical changes reflecting posterior decompression at L1-L2 through L3-L4. No high-grade spinal canal stenosis at the surgical levels, but there is effacement of the left subarticular zone at L1-L2 with possible impingement of the traversing left L2 nerve root. 2. 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 3. Severe left worse than right neural foraminal stenosis at L5-S1 with impingement of the exiting L5 nerve roots. 4. Stepwise grade 1 retrolisthesis of L1 on L2 through L4 on L5, likely degenerative in nature. 5. Fusion of the L1  through L3 vertebral bodies. 6. Severe facet arthropathy at L4-L5 and L5-S1, slightly worse on the right, with associated trace  facet joint effusions. Electronically Signed   By: Valetta Mole M.D.   On: 04/11/2021 17:37   DG FL GUIDED LUMBAR PUNCTURE  Result Date: 04/12/2021 CLINICAL DATA:  Numbness of both lower extremities. EXAM: DIAGNOSTIC LUMBAR PUNCTURE UNDER FLUOROSCOPIC GUIDANCE COMPARISON:  MRI of the lumbar spine 04/11/2021. FLUOROSCOPY TIME:  Fluoroscopy Time:  36 seconds Radiation Exposure Index (if provided by the fluoroscopic device): 18.40 mGy Number of Acquired Spot Images: 1 PROCEDURE: Informed consent was obtained from the patient prior to the procedure, which included a discussion of potential procedure risks including but not limited to headache, allergic reaction, pain, bleeding, infection and spinal cord/nerve root injury. With the patient prone, the lower back was prepped with Betadine. 1% Lidocaine was used for local anesthesia. Lumbar puncture was performed via the L3 laminectomy defect using a 20 gauge needle with return of clear CSF with an opening pressure of 12.5 cm water. 12 ml of CSF were obtained for laboratory studies. The patient tolerated the procedure well. No immediate post-procedure complication was apparent. IMPRESSION: Technically successful fluoroscopically-guided lumbar puncture via an L3 laminectomy defect. 12 mL CSF obtained for laboratory studies. No immediate post-procedure complication. Electronically Signed   By: Kellie Simmering D.O.   On: 04/12/2021 10:59    Medications:    B-complex with vitamin C  1 tablet Oral BID   cyanocobalamin  1,000 mcg Intramuscular QHS   [START ON [id number] folic acid  1 mg Oral Daily   gabapentin  300 mg Oral QHS   pramipexole  1 mg Oral BID   predniSONE  5 mg Oral Daily   tamsulosin  0.4 mg Oral QHS   Vibegron  75 mg Oral Daily   Continuous Infusions:  Immune Globulin 10% 55 g (04/12/21 2111)     LOS: 1 day   Geradine Girt  [facility]   How to contact the [facility] Attending or Consulting provider [place] or covering provider during after hours [place], for this patient?  Check the care team in [facility] and look for a) attending/consulting [facility] provider listed and b) the [facility] team listed Log into [email or web address] and use [place]'s universal password to access. If you do not have the password, please contact the hospital operator. Locate the [facility], [facility], [facility] provider you are looking for under [facility] and page to a number that you can be directly reached. If you still have difficulty reaching the provider, please page the [facility] (Director on Call) for the Hospitalists listed on amion for assistance.  04/13/2021, 1:58 PM

## 2021-04-13 NOTE — Evaluation (Signed)
Physical Therapy Evaluation Patient Details Name: Tony Mcintosh MRN: [id number] DOB: 08-04-1945 Today's Date: 04/13/2021  History of Present Illness  Tony Mcintosh is a 76 y.o. male with medical history significant for lumbar spinal stenosis, peripheral polyneuropathy, chronic back pain, restless leg syndrome, BPH, who is admitted to [facility] on 04/11/2021 for further evaluation and management of presenting numbness/weakness involving the bilateral lower extremities. 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   Clinical Impression  Patient received sitting up on side of bed. Patient reports minimal improvement in strength in legs and a little more feeling. He is independently sitting edge of bed. Patient has grossly 3/5 LE strength. He is able to stand with +2 assist from very elevated bed. Stood at edge of bed with walker and min guard. Patient able to attempt marching in place, but not very successful. He will continue to benefit from skilled PT while here to improve functional independence, strength and safety with mobility for return home.         Recommendations for follow up therapy are one component of a multi-disciplinary discharge planning process, led by the attending physician.  Recommendations may be updated based on patient status, additional functional criteria and insurance authorization.  Follow Up Recommendations Acute inpatient rehab (3hours/day)    Assistance Recommended at Discharge Frequent or constant Supervision/Assistance  Patient can return home with the following  Two people to help with walking and/or transfers;A lot of help with bathing/dressing/bathroom;Help with stairs or ramp for entrance;Assistance with cooking/housework    Equipment Recommendations Other (comment) (TBD)  Recommendations for Other Services  Rehab consult    Functional Status Assessment Patient has had a recent decline in their functional status and demonstrates the ability to make significant  improvements in function in a reasonable and predictable amount of time.     Precautions / Restrictions Precautions Precautions: Fall Restrictions Weight Bearing Restrictions: No      Mobility  Bed Mobility               General bed mobility comments: patient received sitting up on side of bed. Reports his son assisted getting him up to edge of bed.    Transfers Overall transfer level: Needs assistance Equipment used: Rolling walker (2 wheels) Transfers: Sit to/from Stand Sit to Stand: Mod assist;+2 physical assistance;+2 safety/equipment;From elevated surface           General transfer comment: patient able to stand with my assist and neurologist who entered room. Bed very elevated and increased effort to get standing.    Ambulation/Gait               General Gait Details: patient unsafe to attempt ambulation at this time. Marched in place with leaning to his right with weight shifting.  Stairs            Wheelchair Mobility    Modified Rankin (Stroke Patients Only)       Balance Overall balance assessment: Needs assistance Sitting-balance support: Feet supported Sitting balance-Leahy Scale: Good     Standing balance support: Bilateral upper extremity supported;During functional activity;Reliant on assistive device for balance Standing balance-Leahy Scale: Poor                               Pertinent Vitals/Pain Pain Assessment: Faces Faces Pain Scale: Hurts a little bit Pain Location: back, chronic Pain Descriptors / Indicators: Discomfort;Sore Pain Intervention(s): Monitored during session    Home Living Family/patient expects  to be discharged to:: Private residence Living Arrangements: Spouse/significant other Available Help at Discharge: Family;Available PRN/intermittently           Home Layout: One level Home Equipment: Insurance risk surveyor (2 wheels);Cane - single point      Prior Function Prior  Level of Function : Independent/Modified Independent             Mobility Comments: patient is limited ambulator at baseline. Only able to walk/stand for 5 minutes at a time. Uses scooter for longer distances. ADLs Comments: Has wife to assist as needed, son lives in [place].     Hand Dominance        Extremity/Trunk Assessment   Upper Extremity Assessment Upper Extremity Assessment: Generalized weakness    Lower Extremity Assessment Lower Extremity Assessment: Generalized weakness    Cervical / Trunk Assessment Cervical / Trunk Assessment: Normal;Neck Surgery (history of back surgery)  Communication   Communication: No difficulties  Cognition Arousal/Alertness: Awake/alert Behavior During Therapy: WFL for tasks assessed/performed Overall Cognitive Status: Within Functional Limits for tasks assessed                                          General Comments      Exercises     Assessment/Plan    PT Assessment Patient needs continued PT services  PT Problem List Decreased mobility;Decreased activity tolerance;Decreased balance;Pain;Obesity;Decreased strength       PT Treatment Interventions DME instruction;Therapeutic activities;Gait training;Therapeutic exercise;Patient/family education;Functional mobility training;Stair training;Balance training;Neuromuscular re-education    PT Goals (Current goals can be found in the Care Plan section)  Acute Rehab PT Goals Patient Stated Goal: to improve PT Goal Formulation: With patient Time For Goal Achievement: 04/27/21 Potential to Achieve Goals: Fair    Frequency Min 2X/week     Co-evaluation               AM-PAC PT "6 Clicks" Mobility  Outcome Measure Help needed turning from your back to your side while in a flat bed without using bedrails?: A Lot Help needed moving from lying on your back to sitting on the side of a flat bed without using bedrails?: A Lot Help needed moving to and  from a bed to a chair (including a wheelchair)?: Total Help needed standing up from a chair using your arms (e.g., wheelchair or bedside chair)?: A Lot Help needed to walk in hospital room?: Total Help needed climbing 3-5 steps with a railing? : Total 6 Click Score: 9    End of Session   Activity Tolerance: Patient limited by fatigue;Other (comment) (weakness) Patient left: in bed;with call bell/phone within reach;with family/visitor present Nurse Communication: Mobility status PT Visit Diagnosis: Muscle weakness (generalized) (M62.81);Other abnormalities of gait and mobility (R26.89);Unsteadiness on feet (R26.81);Difficulty in walking, not elsewhere classified (R26.2)    Time: 1884-1660 PT Time Calculation (min) (ACUTE ONLY): 24 min   Charges:   PT Evaluation $PT Eval Moderate Complexity: 1 Mod PT Treatments $Therapeutic Activity: 8-22 mins        Brionne Mertz, PT, GCS 04/13/21,11:28 AM

## 2021-04-13 NOTE — Progress Notes (Signed)
Subjective: Sitting up in bed, being evaluated by PT.   Objective: Current vital signs: BP 123/80 (BP Location: Left Arm)    Pulse 67    Temp 97.9 F (36.6 C) (Oral)    Resp 18    Ht 5\' 10"  (1.778 m)    Wt (!) 138.1 kg    SpO2 96%    BMI 43.68 kg/m  Vital signs in last 24 hours: Temp:  [97 F (36.1 C)-98.2 F (36.8 C)] 97.9 F (36.6 C) (01/13 0547) Pulse Rate:  [61-91] 67 (01/13 0547) Resp:  [18-20] 18 (01/13 0547) BP: (109-140)/(67-87) 123/80 (01/13 0547) SpO2:  [94 %-97 %] 96 % (01/13 0547) Weight:  [136.5 kg-138.1 kg] 138.1 kg (01/13 0547)  Intake/Output from previous day: 01/12 0701 - 01/13 0700 In: 480 [P.O.:480] Out: 1450 [Urine:1450] Intake/Output this shift: No intake/output data recorded. Nutritional status:  Diet Order             Diet regular Room service appropriate? Yes; Fluid consistency: Thin  Diet effective now                  HEENT: [place]/AT Lungs: Respirations unlabored Ext: No cyanosis or pallor  Neurologic Exam: Ment: Awake, alert and oriented. Speech is fluent without evidence for aphasia.  CN: Pupils are equal. Fixates and tracks normally. No facial droop. Phonation intact. Head is midline. No neck weakness noted.  Motor: Able to support his weight with BLE and BUE using a walker in the standing position. No asymmetry of BUE and BLE movements during interview and when sitting at edge of bed.  Sensory: Dysesthesia to FT in BLE and BUE with proximal distal gradient, worse distally and worse in feet than hands. Temp sensation is subjectively normal to BUE and proximal BLE, decreased to distal BLE. Reflexes: Improved since yesterday. 2+ bilateral brachioradialis, biceps and triceps. 1+ bilateral patellar and achilles reflexes.  Cerebellar: No ataxia noted.  Gait: He is able to stand with own power, with some difficulty, using a walker for support and with bed elevated to assist with initiation of standing.    Lab Results: Results for orders placed or  performed during the hospital encounter of 04/11/21 (from the past 48 hour(s))  CBG monitoring, ED     Status: Abnormal   Collection Time: 04/11/21 11:14 AM  Result Value Ref Range   Glucose-Capillary 104 (H) 70 - 99 mg/dL    Comment: Glucose reference range applies only to samples taken after fasting for at least 8 hours.  Hemoglobin A1c     Status: Abnormal   Collection Time: 04/11/21 11:15 AM  Result Value Ref Range   Hgb A1c MFr Bld 6.3 (H) 4.8 - 5.6 %    Comment: (NOTE) Pre diabetes:          5.7%-6.4%  Diabetes:              >6.4%  Glycemic control for   <7.0% adults with diabetes    Mean Plasma Glucose 134.11 mg/dL    Comment: Performed at [facility] [address] [place], [place] [address]  Urinalysis, Routine w reflex microscopic     Status: None   Collection Time: 04/11/21  2:22 PM  Result Value Ref Range   Color, Urine YELLOW YELLOW   APPearance CLEAR CLEAR   Specific Gravity, Urine 1.015 1.005 - 1.030   pH 6.5 5.0 - 8.0   Glucose, UA NEGATIVE NEGATIVE mg/dL   Hgb urine dipstick NEGATIVE NEGATIVE  Bilirubin Urine NEGATIVE NEGATIVE   Ketones, ur NEGATIVE NEGATIVE mg/dL   Protein, ur NEGATIVE NEGATIVE mg/dL   Nitrite NEGATIVE NEGATIVE   Leukocytes,Ua NEGATIVE NEGATIVE    Comment: Microscopic not done on urines with negative protein, blood, leukocytes, nitrite, or glucose < 500 mg/dL. Performed at [facility], [address] [place], [place] [address]   Resp Panel by RT-PCR (Flu A&B, Covid) Nasopharyngeal Swab     Status: None   Collection Time: 04/11/21  8:04 PM   Specimen: Nasopharyngeal Swab; Nasopharyngeal(NP) swabs in vial transport medium  Result Value Ref Range   SARS Coronavirus 2 by RT PCR NEGATIVE NEGATIVE    Comment: (NOTE) SARS-CoV-2 target nucleic acids are NOT DETECTED.  The SARS-CoV-2 RNA is generally detectable in upper respiratory specimens during the acute phase of infection. The lowest concentration of SARS-CoV-2 viral  copies this assay can detect is 138 copies/mL. A negative result does not preclude SARS-Cov-2 infection and should not be used as the sole basis for treatment or other patient management decisions. A negative result may occur with  improper specimen collection/handling, submission of specimen other than nasopharyngeal swab, presence of viral mutation(s) within the areas targeted by this assay, and inadequate number of viral copies(<138 copies/mL). A negative result must be combined with clinical observations, patient history, and epidemiological information. The expected result is Negative.  Fact Sheet for Patients:  BloggerCourse.comhttps://www.fda.gov/media/152166/download  Fact Sheet for Healthcare Providers:  SeriousBroker.ithttps://www.fda.gov/media/152162/download  This test is no t yet approved or cleared by the [place] FDA and  has been authorized for detection and/or diagnosis of SARS-CoV-2 by FDA under an Emergency Use Authorization (EUA). This EUA will remain  in effect (meaning this test can be used) for the duration of the COVID-19 declaration under Section 564(b)(1) of the Act, 21 U.S.C.section 360bbb-3(b)(1), unless the authorization is terminated  or revoked sooner.       Influenza A by PCR NEGATIVE NEGATIVE   Influenza B by PCR NEGATIVE NEGATIVE    Comment: (NOTE) The Xpert Xpress SARS-CoV-2/FLU/RSV plus assay is intended as an aid in the diagnosis of influenza from Nasopharyngeal swab specimens and should not be used as a sole basis for treatment. Nasal washings and aspirates are unacceptable for Xpert Xpress SARS-CoV-2/FLU/RSV testing.  Fact Sheet for Patients: BloggerCourse.comhttps://www.fda.gov/media/152166/download  Fact Sheet for Healthcare Providers: SeriousBroker.ithttps://www.fda.gov/media/152162/download  This test is not yet approved or cleared by the [place] FDA and has been authorized for detection and/or diagnosis of SARS-CoV-2 by FDA under an Emergency Use Authorization (EUA). This EUA will  remain in effect (meaning this test can be used) for the duration of the COVID-19 declaration under Section 564(b)(1) of the Act, 21 U.S.C. section 360bbb-3(b)(1), unless the authorization is terminated or revoked.  Performed at [facility], [address] [place], [place] [address]   Magnesium     Status: None   Collection Time: 04/12/21  2:12 AM  Result Value Ref Range   Magnesium 2.0 1.7 - 2.4 mg/dL    Comment: Performed at [facility], [address] [place], [place] [address]  Comprehensive metabolic panel     Status: Abnormal   Collection Time: 04/12/21  2:12 AM  Result Value Ref Range   Sodium 136 135 - 145 mmol/L   Potassium 4.2 3.5 - 5.1 mmol/L   Chloride 101 98 - 111 mmol/L   CO2 23 22 - 32 mmol/L   Glucose, Bld 153 (H) 70 - 99 mg/dL    Comment:  Glucose reference range applies only to samples taken after fasting for at least 8 hours.   BUN 17 8 - 23 mg/dL   Creatinine, Ser 0.90 0.61 - 1.24 mg/dL   Calcium 8.9 8.9 - 10.3 mg/dL   Total Protein 6.7 6.5 - 8.1 g/dL   Albumin 3.6 3.5 - 5.0 g/dL   AST 29 15 - 41 U/L   ALT 30 0 - 44 U/L   Alkaline Phosphatase 59 38 - 126 U/L   Total Bilirubin 1.1 0.3 - 1.2 mg/dL   GFR, Estimated >60 >60 mL/min    Comment: (NOTE) Calculated using the CKD-EPI Creatinine Equation (2021)    Anion gap 12 5 - 15    Comment: Performed at [facility] [address], [place], [place] [address]  CBC with Differential/Platelet     Status: Abnormal   Collection Time: 04/12/21  2:12 AM  Result Value Ref Range   WBC 6.8 4.0 - 10.5 K/uL   RBC 4.44 4.22 - 5.81 MIL/uL   Hemoglobin 14.5 13.0 - 17.0 g/dL   HCT 43.2 39.0 - 52.0 %   MCV 97.3 80.0 - 100.0 fL   MCH 32.7 26.0 - 34.0 pg   MCHC 33.6 30.0 - 36.0 g/dL   RDW 14.2 11.5 - 15.5 %   Platelets 199 150 - 400 K/uL   nRBC 0.0 0.0 - 0.2 %   Neutrophils Relative % 90 %   Neutro Abs 6.1 1.7 - 7.7 K/uL   Lymphocytes Relative 7 %   Lymphs Abs 0.5 (L) 0.7 - 4.0 K/uL   Monocytes  Relative 2 %   Monocytes Absolute 0.1 0.1 - 1.0 K/uL   Eosinophils Relative 0 %   Eosinophils Absolute 0.0 0.0 - 0.5 K/uL   Basophils Relative 0 %   Basophils Absolute 0.0 0.0 - 0.1 K/uL   Immature Granulocytes 1 %   Abs Immature Granulocytes 0.06 0.00 - 0.07 K/uL    Comment: Performed at [facility] [address] [place], [place] [address]  Vitamin B12     Status: None   Collection Time: 04/12/21  2:12 AM  Result Value Ref Range   Vitamin B-12 402 180 - 914 pg/mL    Comment: (NOTE) This assay is not validated for testing neonatal or myeloproliferative syndrome specimens for Vitamin B12 levels. Performed at [facility], [place] [address], [place], [place] [address]   TSH     Status: None   Collection Time: 04/12/21  2:12 AM  Result Value Ref Range   TSH 1.065 0.350 - 4.500 uIU/mL    Comment: Performed by a 3rd Generation assay with a functional sensitivity of <=0.01 uIU/mL. Performed at [facility], [place] [address], [place], [place] [address]   Hemoglobin A1c     Status: Abnormal   Collection Time: 04/12/21  2:12 AM  Result Value Ref Range   Hgb A1c MFr Bld 6.3 (H) 4.8 - 5.6 %    Comment: (NOTE) Pre diabetes:          5.7%-6.4%  Diabetes:              >6.4%  Glycemic control for   <7.0% adults with diabetes    Mean Plasma Glucose 134.11 mg/dL    Comment: Performed at [facility] [address], [place], [place] [address]  Glucose, CSF     Status: Abnormal   Collection Time: 04/12/21 11:05 AM  Result Value Ref Range  Glucose, CSF 95 (H) 40 - 70 mg/dL    Comment: Performed at [facility] [address], [place], [place] [address]  Protein, CSF     Status: Abnormal   Collection Time: 04/12/21 11:05 AM  Result Value Ref Range   Total  Protein, CSF 51 (H) 15 - 45 mg/dL    Comment: Performed at [facility] [address] [place], [place] [address]  CSF cell count with differential     Status: Abnormal   Collection Time:  04/12/21 11:06 AM  Result Value Ref Range   Tube # 3    Color, CSF COLORLESS COLORLESS   Appearance, CSF CLEAR CLEAR   Supernatant NOT INDICATED    RBC Count, CSF 123 (H) 0 /cu mm   WBC, CSF 0 0 - 5 /cu mm   Other Cells, CSF TOO FEW TO COUNT, SMEAR AVAILABLE FOR REVIEW     Comment: RARE NEUTROPHILS, RARE LYMPHOCYTES AND RARE MONOCYTES. Performed at [facility], [place] [address] [place], [place] [address]   Folate, serum, performed at [facility] lab     Status: None   Collection Time: 04/13/21  8:05 AM  Result Value Ref Range   Folate 17.7 >5.9 ng/mL    Comment: Performed at [facility], [place] [address], [place], [place] [address]    Recent Results (from the past 240 hour(s))  Resp Panel by RT-PCR (Flu A&B, Covid) Nasopharyngeal Swab     Status: None   Collection Time: 04/11/21  8:04 PM   Specimen: Nasopharyngeal Swab; Nasopharyngeal(NP) swabs in vial transport medium  Result Value Ref Range Status   SARS Coronavirus 2 by RT PCR NEGATIVE NEGATIVE Final    Comment: (NOTE) SARS-CoV-2 target nucleic acids are NOT DETECTED.  The SARS-CoV-2 RNA is generally detectable in upper respiratory specimens during the acute phase of infection. The lowest concentration of SARS-CoV-2 viral copies this assay can detect is 138 copies/mL. A negative result does not preclude SARS-Cov-2 infection and should not be used as the sole basis for treatment or other patient management decisions. A negative result may occur with  improper specimen collection/handling, submission of specimen other than nasopharyngeal swab, presence of viral mutation(s) within the areas targeted by this assay, and inadequate number of viral copies(<138 copies/mL). A negative result must be combined with clinical observations, patient history, and epidemiological information. The expected result is Negative.  Fact Sheet for Patients:  EntrepreneurPulse.com.au  Fact Sheet for Healthcare  Providers:  IncredibleEmployment.be  This test is no t yet approved or cleared by the [place] FDA and  has been authorized for detection and/or diagnosis of SARS-CoV-2 by FDA under an Emergency Use Authorization (EUA). This EUA will remain  in effect (meaning this test can be used) for the duration of the COVID-19 declaration under Section 564(b)(1) of the Act, 21 U.S.C.section 360bbb-3(b)(1), unless the authorization is terminated  or revoked sooner.       Influenza A by PCR NEGATIVE NEGATIVE Final   Influenza B by PCR NEGATIVE NEGATIVE Final    Comment: (NOTE) The Xpert Xpress SARS-CoV-2/FLU/RSV plus assay is intended as an aid in the diagnosis of influenza from Nasopharyngeal swab specimens and should not be used as a sole basis for treatment. Nasal washings and aspirates are unacceptable for Xpert Xpress SARS-CoV-2/FLU/RSV testing.  Fact Sheet for Patients: EntrepreneurPulse.com.au  Fact Sheet for Healthcare Providers: IncredibleEmployment.be  This test is not yet approved or cleared by the [place] FDA  and has been authorized for detection and/or diagnosis of SARS-CoV-2 by FDA under an Emergency Use Authorization (EUA). This EUA will remain in effect (meaning this test can be used) for the duration of the COVID-19 declaration under Section 564(b)(1) of the Act, 21 U.S.C. section 360bbb-3(b)(1), unless the authorization is terminated or revoked.  Performed at [facility], [place] [address], [place], [place] [address]     Lipid Panel No results for input(s): CHOL, TRIG, HDL, CHOLHDL, VLDL, LDLCALC in the last 72 hours.  Studies/Results: MR Cervical Spine Wo Contrast  Result Date: 04/11/2021 CLINICAL DATA:  Incontinence, numbness, weakness. Numbness in hands starting yesterday, no control of urine or bowel movements, unable to walk, no feeling in legs EXAM: MRI CERVICAL, THORACIC AND LUMBAR SPINE  WITHOUT CONTRAST TECHNIQUE: Multiplanar and multiecho pulse sequences of the cervical spine, to include the craniocervical junction and cervicothoracic junction, and thoracic and lumbar spine, were obtained without intravenous contrast. COMPARISON:  None. FINDINGS: MRI CERVICAL SPINE FINDINGS Alignment: There is straightening of the normal cervical spine lordosis. There is no antero or retrolisthesis. Vertebrae: Vertebral body heights are preserved. There is no suspicious marrow signal abnormality. There is no marrow edema. Cord: Normal in signal and morphology. Posterior Fossa, vertebral arteries, paraspinal tissues: The imaged posterior fossa is unremarkable. The vertebral artery flow voids are present. The paraspinal soft tissues are unremarkable. Disc levels: Evaluation of the neural foramina is suboptimal due to poor signal on the axial T2 images. There is multilevel disc desiccation and narrowing, most advanced at C4-C5 and C5-C6. There is multilevel facet arthropathy, overall left worse than right. C2-C3: Mild uncovertebral and bilateral facet arthropathy result in mild left and no significant right neural foraminal stenosis without significant spinal canal stenosis C3-C4: There is a posterior disc osteophyte complex and uncovertebral and bilateral facet arthropathy resulting in severe left worse than right neural foraminal stenosis and mild spinal canal stenosis. C4-C5: There is a posterior disc osteophyte complex and uncovertebral and bilateral facet arthropathy resulting in severe left worse than right neural foraminal stenosis and mild to moderate spinal canal stenosis. C5-C6: There is a posterior disc osteophyte complex and uncovertebral and bilateral facet arthropathy resulting in severe bilateral neural foraminal stenosis and mild spinal canal stenosis. C6-C7: There is a posterior disc osteophyte complex and uncovertebral and bilateral facet arthropathy resulting in severe bilateral neural foraminal  stenosis and mild-to-moderate spinal canal stenosis. C7-T1: There is a mild disc bulge resulting in mild right and no significant left neural foraminal stenosis without significant spinal canal stenosis. MRI THORACIC SPINE FINDINGS Alignment:  Normal. Vertebrae: Vertebral body heights are preserved. There is partial fusion of the T5 and T6 vertebral bodies. Cord:  Normal in signal and morphology. Paraspinal and other soft tissues: Unremarkable. Disc levels: There is multilevel disc desiccation and narrowing throughout the thoracic spine. There are bulky flowing anterior endplate osteophytes throughout the mid and lower thoracic spine consistent with diffuse idiopathic skeletal hyperostosis. T2-T3: There is a mild disc protrusion without significant spinal canal or neural foraminal stenosis. T3-T4: There is a small right paracentral disc protrusion without significant spinal canal or neural foraminal stenosis. T4-T5: There is a small left paracentral disc protrusion without significant spinal canal or neural foraminal stenosis. T6-T7: There is a small central protrusion resulting in mild spinal canal narrowing without significant neural foraminal stenosis. T7-T8: There is a small left paracentral disc protrusion without significant spinal canal or neural foraminal stenosis. T8-T9: There is a prominent central protrusion resulting in mild  spinal canal stenosis with effacement of the ventral thecal sac but no mass effect on the cord, and no significant neural foraminal stenosis. T9-T10: There is a small central protrusion without significant spinal canal or neural foraminal stenosis. T10-T11: There is a large disc protrusion/extrusion and bilateral facet arthropathy resulting in moderate spinal canal stenosis with effacement of the ventral thecal sac and mild indentation of the cord and mild bilateral neural foraminal stenosis. T11-T12: There is a prominent central protrusion resulting in mild spinal canal stenosis  with effacement of the ventral thecal sac without significant neural foraminal stenosis. T12-L1: There is a prominent central protrusion/extrusion resulting in mild-to-moderate spinal canal stenosis without significant neural foraminal stenosis. MRI LUMBAR SPINE FINDINGS Segmentation:  Standard. Alignment: There is dextroscoliosis of the lumbar spine centered at L2-L3. There is grade 1 retrolisthesis of L1 on L2, L2 on L3, L3 on L4, and L4 on L5, all likely degenerative in nature. Vertebrae: There is fusion of the L1 through L3 vertebral bodies. Marrow signal is mildly heterogeneous with degenerative signal abnormality at L1 through L3 and about the L4-L5 disc space. There is no suspicious marrow signal abnormality. Postsurgical changes are noted reflecting posterior decompression at L1-L2 through L3-L4. Conus medullaris and cauda equina: Conus extends to the mid L1 level. Conus and cauda equina appear normal. Paraspinal and other soft tissues: There is atrophy of the right psoas muscle. Disc levels: There is obliteration of the disc spaces at L1-L2 and L2-L3. There is advanced disc space narrowing and desiccation throughout the remainder of the lumbar spine. There is severe facet arthropathy at L4-L5 and L5-S1, slightly worse on the right. There are associated trace bilateral facet joint effusions. L1-L2: Status post posterior decompression. There is a broad-based central disc protrusion, degenerative endplate change, and bilateral facet arthropathy resulting in mild spinal canal stenosis with effacement of the left subarticular zone and suspected impingement of the traversing left L2 nerve root without significant neural foraminal stenosis. L2-L3: Status post posterior decompression. There is degenerative endplate change and bilateral facet arthropathy resulting in mild left and no significant right neural foraminal stenosis and no significant spinal canal stenosis. L3-L4: Status post posterior decompression.  There is a mild disc bulge, degenerative endplate change, and bilateral facet arthropathy resulting in mild narrowing of the subarticular zones, left worse than right, without evidence of frank nerve root impingement, and mild right worse than left neural foraminal stenosis. L4-L5: There is prominent endplate spurring and bulky bilateral facet arthropathy resulting in moderate to severe spinal canal stenosis with effacement of the subarticular zones and possible impingement of the traversing L5 nerve roots, and moderate left and severe right neural foraminal stenosis with impingement of the exiting right L4 nerve root. L5-S1: There is a diffuse disc bulge, degenerative endplate change, and bilateral facet arthropathy resulting in severe left worse than right with impingement of the exiting L5 nerve roots. IMPRESSION: CERVICAL SPINE MRI: 1. Evaluation of the neural foramina is suboptimal due to poor signal on the axial T2 images. Within this confine: 2. Multilevel degenerative changes throughout the cervical spine as detailed above resulting in up to mild-to-moderate spinal canal stenosis at C4-C5 and C6-C7 and severe bilateral neural foraminal stenosis at C3-C4 through C6-C7. 3. No evidence of cord compression or cord signal abnormality. THORACIC SPINE MRI: 1. Large disc protrusion/extrusion at T10-T11 resulting in moderate spinal canal stenosis with mild indentation of the cord and mild bilateral neural foraminal stenosis. 2. Numerous additional disc protrusions throughout the remainder of the thoracic  spine as detailed above without other high-grade spinal canal stenosis. No high-grade neural foraminal stenosis or evidence of nerve root impingement in the thoracic spine. 3. 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 4. No cord signal abnormality. LUMBAR SPINE MRI: 1. Postsurgical changes reflecting posterior decompression at L1-L2  through L3-L4. No high-grade spinal canal stenosis at the surgical levels, but there is effacement of the left subarticular zone at L1-L2 with possible impingement of the traversing left L2 nerve root. 2. 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 3. Severe left worse than right neural foraminal stenosis at L5-S1 with impingement of the exiting L5 nerve roots. 4. Stepwise grade 1 retrolisthesis of L1 on L2 through L4 on L5, likely degenerative in nature. 5. Fusion of the L1 through L3 vertebral bodies. 6. Severe facet arthropathy at L4-L5 and L5-S1, slightly worse on the right, with associated trace facet joint effusions. Electronically Signed   By: Valetta Mole M.D.   On: 04/11/2021 17:37   MR THORACIC SPINE WO CONTRAST  Result Date: 04/11/2021 CLINICAL DATA:  Incontinence, numbness, weakness. Numbness in hands starting yesterday, no control of urine or bowel movements, unable to walk, no feeling in legs EXAM: MRI CERVICAL, THORACIC AND LUMBAR SPINE WITHOUT CONTRAST TECHNIQUE: Multiplanar and multiecho pulse sequences of the cervical spine, to include the craniocervical junction and cervicothoracic junction, and thoracic and lumbar spine, were obtained without intravenous contrast. COMPARISON:  None. FINDINGS: MRI CERVICAL SPINE FINDINGS Alignment: There is straightening of the normal cervical spine lordosis. There is no antero or retrolisthesis. Vertebrae: Vertebral body heights are preserved. There is no suspicious marrow signal abnormality. There is no marrow edema. Cord: Normal in signal and morphology. Posterior Fossa, vertebral arteries, paraspinal tissues: The imaged posterior fossa is unremarkable. The vertebral artery flow voids are present. The paraspinal soft tissues are unremarkable. Disc levels: Evaluation of the neural  foramina is suboptimal due to poor signal on the axial T2 images. There is multilevel disc desiccation and narrowing, most advanced at C4-C5 and C5-C6. There is multilevel facet arthropathy, overall left worse than right. C2-C3: Mild uncovertebral and bilateral facet arthropathy result in mild left and no significant right neural foraminal stenosis without significant spinal canal stenosis C3-C4: There is a posterior disc osteophyte complex and uncovertebral and bilateral facet arthropathy resulting in severe left worse than right neural foraminal stenosis and mild spinal canal stenosis. C4-C5: There is a posterior disc osteophyte complex and uncovertebral and bilateral facet arthropathy resulting in severe left worse than right neural foraminal stenosis and mild to moderate spinal canal stenosis. C5-C6: There is a posterior disc osteophyte complex and uncovertebral and bilateral facet arthropathy resulting in severe bilateral neural foraminal stenosis and mild spinal canal stenosis. C6-C7: There is a posterior disc osteophyte complex and uncovertebral and bilateral facet arthropathy resulting in severe bilateral neural foraminal stenosis and mild-to-moderate spinal canal stenosis. C7-T1: There is a mild disc bulge resulting in mild right and no significant left neural foraminal stenosis without significant spinal canal stenosis. MRI THORACIC SPINE FINDINGS Alignment:  Normal. Vertebrae: Vertebral body heights are preserved. There is partial fusion of the T5 and T6 vertebral bodies. Cord:  Normal in signal and morphology. Paraspinal and other soft tissues: Unremarkable. Disc levels: There is multilevel disc desiccation and narrowing throughout the thoracic spine.  There are bulky flowing anterior endplate osteophytes throughout the mid and lower thoracic spine consistent with diffuse idiopathic skeletal hyperostosis. T2-T3: There is a mild disc protrusion without significant spinal canal or neural foraminal stenosis.  T3-T4: There is a small right paracentral disc protrusion without significant spinal canal or neural foraminal stenosis. T4-T5: There is a small left paracentral disc protrusion without significant spinal canal or neural foraminal stenosis. T6-T7: There is a small central protrusion resulting in mild spinal canal narrowing without significant neural foraminal stenosis. T7-T8: There is a small left paracentral disc protrusion without significant spinal canal or neural foraminal stenosis. T8-T9: There is a prominent central protrusion resulting in mild spinal canal stenosis with effacement of the ventral thecal sac but no mass effect on the cord, and no significant neural foraminal stenosis. T9-T10: There is a small central protrusion without significant spinal canal or neural foraminal stenosis. T10-T11: There is a large disc protrusion/extrusion and bilateral facet arthropathy resulting in moderate spinal canal stenosis with effacement of the ventral thecal sac and mild indentation of the cord and mild bilateral neural foraminal stenosis. T11-T12: There is a prominent central protrusion resulting in mild spinal canal stenosis with effacement of the ventral thecal sac without significant neural foraminal stenosis. T12-L1: There is a prominent central protrusion/extrusion resulting in mild-to-moderate spinal canal stenosis without significant neural foraminal stenosis. MRI LUMBAR SPINE FINDINGS Segmentation:  Standard. Alignment: There is dextroscoliosis of the lumbar spine centered at L2-L3. There is grade 1 retrolisthesis of L1 on L2, L2 on L3, L3 on L4, and L4 on L5, all likely degenerative in nature. Vertebrae: There is fusion of the L1 through L3 vertebral bodies. Marrow signal is mildly heterogeneous with degenerative signal abnormality at L1 through L3 and about the L4-L5 disc space. There is no suspicious marrow signal abnormality. Postsurgical changes are noted reflecting posterior decompression at L1-L2  through L3-L4. Conus medullaris and cauda equina: Conus extends to the mid L1 level. Conus and cauda equina appear normal. Paraspinal and other soft tissues: There is atrophy of the right psoas muscle. Disc levels: There is obliteration of the disc spaces at L1-L2 and L2-L3. There is advanced disc space narrowing and desiccation throughout the remainder of the lumbar spine. There is severe facet arthropathy at L4-L5 and L5-S1, slightly worse on the right. There are associated trace bilateral facet joint effusions. L1-L2: Status post posterior decompression. There is a broad-based central disc protrusion, degenerative endplate change, and bilateral facet arthropathy resulting in mild spinal canal stenosis with effacement of the left subarticular zone and suspected impingement of the traversing left L2 nerve root without significant neural foraminal stenosis. L2-L3: Status post posterior decompression. There is degenerative endplate change and bilateral facet arthropathy resulting in mild left and no significant right neural foraminal stenosis and no significant spinal canal stenosis. L3-L4: Status post posterior decompression. There is a mild disc bulge, degenerative endplate change, and bilateral facet arthropathy resulting in mild narrowing of the subarticular zones, left worse than right, without evidence of frank nerve root impingement, and mild right worse than left neural foraminal stenosis. L4-L5: There is prominent endplate spurring and bulky bilateral facet arthropathy resulting in moderate to severe spinal canal stenosis with effacement of the subarticular zones and possible impingement of the traversing L5 nerve roots, and moderate left and severe right neural foraminal stenosis with impingement of the exiting right L4 nerve root. L5-S1: There is a diffuse disc bulge, degenerative endplate change, and bilateral facet arthropathy resulting in severe left worse than  right with impingement of the exiting L5  nerve roots. IMPRESSION: CERVICAL SPINE MRI: 1. Evaluation of the neural foramina is suboptimal due to poor signal on the axial T2 images. Within this confine: 2. Multilevel degenerative changes throughout the cervical spine as detailed above resulting in up to mild-to-moderate spinal canal stenosis at C4-C5 and C6-C7 and severe bilateral neural foraminal stenosis at C3-C4 through C6-C7. 3. No evidence of cord compression or cord signal abnormality. THORACIC SPINE MRI: 1. Large disc protrusion/extrusion at T10-T11 resulting in moderate spinal canal stenosis with mild indentation of the cord and mild bilateral neural foraminal stenosis. 2. Numerous additional disc protrusions throughout the remainder of the thoracic spine as detailed above without other high-grade spinal canal stenosis. No high-grade neural foraminal stenosis or evidence of nerve root impingement in the thoracic spine. 3. 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 4. No cord signal abnormality. LUMBAR SPINE MRI: 1. Postsurgical changes reflecting posterior decompression at L1-L2 through L3-L4. No high-grade spinal canal stenosis at the surgical levels, but there is effacement of the left subarticular zone at L1-L2 with possible impingement of the traversing left L2 nerve root. 2. 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 3. Severe left worse than right neural foraminal stenosis at L5-S1 with impingement of the exiting L5 nerve roots. 4. Stepwise grade 1 retrolisthesis of L1 on L2 through L4 on L5, likely degenerative in nature. 5. Fusion of the L1 through L3 vertebral bodies. 6. Severe facet arthropathy at L4-L5 and L5-S1, slightly worse on the right, with associated trace facet joint effusions.  Electronically Signed   By: Valetta Mole M.D.   On: 04/11/2021 17:37   MR LUMBAR SPINE WO CONTRAST  Result Date: 04/11/2021 CLINICAL DATA:  Incontinence, numbness, weakness. Numbness in hands starting yesterday, no control of urine or bowel movements, unable to walk, no feeling in legs EXAM: MRI CERVICAL, THORACIC AND LUMBAR SPINE WITHOUT CONTRAST TECHNIQUE: Multiplanar and multiecho pulse sequences of the cervical spine, to include the craniocervical junction and cervicothoracic junction, and thoracic and lumbar spine, were obtained without intravenous contrast. COMPARISON:  None. FINDINGS: MRI CERVICAL SPINE FINDINGS Alignment: There is straightening of the normal cervical spine lordosis. There is no antero or retrolisthesis. Vertebrae: Vertebral body heights are preserved. There is no suspicious marrow signal abnormality. There is no marrow edema. Cord: Normal in signal and morphology. Posterior Fossa, vertebral arteries, paraspinal tissues: The imaged posterior fossa is unremarkable. The vertebral artery flow voids are present. The paraspinal soft tissues are unremarkable. Disc levels: Evaluation of the neural foramina is suboptimal due to poor signal on the axial T2 images. There is multilevel disc desiccation and narrowing, most advanced at C4-C5 and C5-C6. There is multilevel facet arthropathy, overall left worse than right. C2-C3: Mild uncovertebral and bilateral facet arthropathy result in mild left and no significant right neural foraminal stenosis without significant spinal canal stenosis C3-C4: There is a posterior disc osteophyte complex and uncovertebral and bilateral facet arthropathy resulting in severe left worse than right neural foraminal stenosis and mild spinal canal stenosis. C4-C5: There is a posterior disc osteophyte complex and uncovertebral and bilateral facet arthropathy resulting in severe left worse than right neural foraminal stenosis and mild to moderate spinal canal stenosis.  C5-C6: There is a posterior disc osteophyte complex and uncovertebral  and bilateral facet arthropathy resulting in severe bilateral neural foraminal stenosis and mild spinal canal stenosis. C6-C7: There is a posterior disc osteophyte complex and uncovertebral and bilateral facet arthropathy resulting in severe bilateral neural foraminal stenosis and mild-to-moderate spinal canal stenosis. C7-T1: There is a mild disc bulge resulting in mild right and no significant left neural foraminal stenosis without significant spinal canal stenosis. MRI THORACIC SPINE FINDINGS Alignment:  Normal. Vertebrae: Vertebral body heights are preserved. There is partial fusion of the T5 and T6 vertebral bodies. Cord:  Normal in signal and morphology. Paraspinal and other soft tissues: Unremarkable. Disc levels: There is multilevel disc desiccation and narrowing throughout the thoracic spine. There are bulky flowing anterior endplate osteophytes throughout the mid and lower thoracic spine consistent with diffuse idiopathic skeletal hyperostosis. T2-T3: There is a mild disc protrusion without significant spinal canal or neural foraminal stenosis. T3-T4: There is a small right paracentral disc protrusion without significant spinal canal or neural foraminal stenosis. T4-T5: There is a small left paracentral disc protrusion without significant spinal canal or neural foraminal stenosis. T6-T7: There is a small central protrusion resulting in mild spinal canal narrowing without significant neural foraminal stenosis. T7-T8: There is a small left paracentral disc protrusion without significant spinal canal or neural foraminal stenosis. T8-T9: There is a prominent central protrusion resulting in mild spinal canal stenosis with effacement of the ventral thecal sac but no mass effect on the cord, and no significant neural foraminal stenosis. T9-T10: There is a small central protrusion without significant spinal canal or neural foraminal stenosis.  T10-T11: There is a large disc protrusion/extrusion and bilateral facet arthropathy resulting in moderate spinal canal stenosis with effacement of the ventral thecal sac and mild indentation of the cord and mild bilateral neural foraminal stenosis. T11-T12: There is a prominent central protrusion resulting in mild spinal canal stenosis with effacement of the ventral thecal sac without significant neural foraminal stenosis. T12-L1: There is a prominent central protrusion/extrusion resulting in mild-to-moderate spinal canal stenosis without significant neural foraminal stenosis. MRI LUMBAR SPINE FINDINGS Segmentation:  Standard. Alignment: There is dextroscoliosis of the lumbar spine centered at L2-L3. There is grade 1 retrolisthesis of L1 on L2, L2 on L3, L3 on L4, and L4 on L5, all likely degenerative in nature. Vertebrae: There is fusion of the L1 through L3 vertebral bodies. Marrow signal is mildly heterogeneous with degenerative signal abnormality at L1 through L3 and about the L4-L5 disc space. There is no suspicious marrow signal abnormality. Postsurgical changes are noted reflecting posterior decompression at L1-L2 through L3-L4. Conus medullaris and cauda equina: Conus extends to the mid L1 level. Conus and cauda equina appear normal. Paraspinal and other soft tissues: There is atrophy of the right psoas muscle. Disc levels: There is obliteration of the disc spaces at L1-L2 and L2-L3. There is advanced disc space narrowing and desiccation throughout the remainder of the lumbar spine. There is severe facet arthropathy at L4-L5 and L5-S1, slightly worse on the right. There are associated trace bilateral facet joint effusions. L1-L2: Status post posterior decompression. There is a broad-based central disc protrusion, degenerative endplate change, and bilateral facet arthropathy resulting in mild spinal canal stenosis with effacement of the left subarticular zone and suspected impingement of the traversing left  L2 nerve root without significant neural foraminal stenosis. L2-L3: Status post posterior decompression. There is degenerative endplate change and bilateral facet arthropathy resulting in mild left and no significant right neural foraminal stenosis and no significant spinal canal stenosis. L3-L4: Status post  posterior decompression. There is a mild disc bulge, degenerative endplate change, and bilateral facet arthropathy resulting in mild narrowing of the subarticular zones, left worse than right, without evidence of frank nerve root impingement, and mild right worse than left neural foraminal stenosis. L4-L5: There is prominent endplate spurring and bulky bilateral facet arthropathy resulting in moderate to severe spinal canal stenosis with effacement of the subarticular zones and possible impingement of the traversing L5 nerve roots, and moderate left and severe right neural foraminal stenosis with impingement of the exiting right L4 nerve root. L5-S1: There is a diffuse disc bulge, degenerative endplate change, and bilateral facet arthropathy resulting in severe left worse than right with impingement of the exiting L5 nerve roots. IMPRESSION: CERVICAL SPINE MRI: 1. Evaluation of the neural foramina is suboptimal due to poor signal on the axial T2 images. Within this confine: 2. Multilevel degenerative changes throughout the cervical spine as detailed above resulting in up to mild-to-moderate spinal canal stenosis at C4-C5 and C6-C7 and severe bilateral neural foraminal stenosis at C3-C4 through C6-C7. 3. No evidence of cord compression or cord signal abnormality. THORACIC SPINE MRI: 1. Large disc protrusion/extrusion at T10-T11 resulting in moderate spinal canal stenosis with mild indentation of the cord and mild bilateral neural foraminal stenosis. 2. Numerous additional disc protrusions throughout the remainder of the thoracic spine as detailed above without other high-grade spinal canal stenosis. No high-grade  neural foraminal stenosis or evidence of nerve root impingement in the thoracic spine. 3. 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 4. No cord signal abnormality. LUMBAR SPINE MRI: 1. Postsurgical changes reflecting posterior decompression at L1-L2 through L3-L4. No high-grade spinal canal stenosis at the surgical levels, but there is effacement of the left subarticular zone at L1-L2 with possible impingement of the traversing left L2 nerve root. 2. 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 3. Severe left worse than right neural foraminal stenosis at L5-S1 with impingement of the exiting L5 nerve roots. 4. Stepwise grade 1 retrolisthesis of L1 on L2 through L4 on L5, likely degenerative in nature. 5. Fusion of the L1 through L3 vertebral bodies. 6. Severe facet arthropathy at L4-L5 and L5-S1, slightly worse on the right, with associated trace facet joint effusions. Electronically Signed   By: Lesia Hausen M.D.   On: 04/11/2021 17:37   DG FL GUIDED LUMBAR PUNCTURE  Result Date: 04/12/2021 CLINICAL DATA:  Numbness of both lower extremities. EXAM: DIAGNOSTIC LUMBAR PUNCTURE UNDER FLUOROSCOPIC GUIDANCE COMPARISON:  MRI of the lumbar spine 04/11/2021. FLUOROSCOPY TIME:  Fluoroscopy Time:  36 seconds Radiation Exposure Index (if provided by the fluoroscopic device): 18.40 mGy Number of Acquired Spot Images: 1 PROCEDURE: Informed consent was obtained from the patient prior to the procedure, which included a discussion of potential procedure risks including but not limited to headache, allergic reaction, pain, bleeding, infection and spinal cord/nerve root injury. With the patient prone, the lower back was prepped with Betadine. 1% Lidocaine was used for local  anesthesia. Lumbar puncture was performed via the L3 laminectomy defect using a 20 gauge needle with return of clear CSF with an opening pressure of 12.5 cm water. 12 ml of CSF were obtained for laboratory studies. The patient tolerated the procedure well. No immediate post-procedure complication was apparent. IMPRESSION: Technically successful fluoroscopically-guided lumbar  puncture via an L3 laminectomy defect. 12 mL CSF obtained for laboratory studies. No immediate post-procedure complication. Electronically Signed   By: Kellie Simmering D.O.   On: 04/12/2021 10:59    Medications: Scheduled:  B-complex with vitamin C  1 tablet Oral BID   cyanocobalamin  1,000 mcg Intramuscular QHS   [START ON [id number] folic acid  1 mg Oral Daily   gabapentin  300 mg Oral QHS   pramipexole  1 mg Oral BID   predniSONE  5 mg Oral Daily   tamsulosin  0.4 mg Oral QHS   Vibegron  75 mg Oral Daily   Continuous:  Immune Globulin 10% 55 g (04/12/21 2111)   MRI Spine.  CERVICAL SPINE MRI:  1. Evaluation of the neural foramina is suboptimal due to poor signal on the axial T2 images. Within this confine: 2. Multilevel degenerative changes throughout the cervical spine as detailed above resulting in up to mild-to-moderate spinal canal stenosis at C4-C5 and C6-C7 and severe bilateral neural foraminal stenosis at C3-C4 through C6-C7. 3. No evidence of cord compression or cord signal abnormality.   THORACIC SPINE MRI:  1. Large disc protrusion/extrusion at T10-T11 resulting in moderate spinal canal stenosis with mild indentation of the cord and mild bilateral neural foraminal stenosis. 2. Numerous additional disc protrusions throughout the remainder of the thoracic spine as detailed above without other high-grade spinal canal stenosis. No high-grade neural foraminal stenosis or evidence of nerve root impingement in the thoracic spine. 3. Flowing anterior osteophytes in the mid and lower thoracic spine consistent  with diffuse idiopathic skeletal hyperostosis. 4. No cord signal abnormality.   LUMBAR SPINE MRI:  1. Postsurgical changes reflecting posterior decompression at L1-L2 through L3-L4. No high-grade spinal canal stenosis at the surgical levels, but there is effacement of the left subarticular zone at L1-L2 with possible impingement of the traversing left L2 nerve root. 2. 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 3. Severe left worse than right neural foraminal stenosis at L5-S1 with impingement of the exiting L5 nerve roots. 4. Stepwise grade 1 retrolisthesis of L1 on L2 through L4 on L5, likely degenerative in nature. 5. Fusion of the L1 through L3 vertebral bodies. 6. Severe facet arthropathy at L4-L5 and L5-S1, slightly worse on the right, with associated trace facet joint effusions.     Assessment: 76 yo male who presented with weakness and numbness to feet and body to below shoulders. This started in his hands, then manifested in his feet followed by progression to the level of the upper chest dermatomes as an ascending pattern. Of note, he has a 12 year history of BLE weakness following back surgery in 2011, with stepwise on gradual worsening due to a combination of falls, chronic pain and deconditioning. On this background, acute worsening of the BLE weakness with associated new onset numbness and tingling also involving the BUE began on Monday. No provoking factors recalled by the patient.   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 NP went up to room yesterday to explain LP results and treatment for GBS. Spoke to patient about treatment with IVIG. Discussed small risk of blood clot and/or allergic reaction. He accepted these risks and agreed to blood  product administration.  - His c/o BM and urinary incontinence may  be associated with cauda equina syndrome, but imaging reviewed and there are no acute changes to support a cauda equina syndrome. More likely to be due to GBS.  - BLE areflexia noted on initial exams is now improved to 1+ bilateral patellar and achilles reflexes s/p his first dose of IVIG. Patient states today that he can also distinctly feel activation of his reflexes when they occur, but was not able to feel any responses on Wednesday or Thursday.   - Exam today also reveals continued improvement in strength. He is now able to stand with own power, with some difficulty, using a walker for support and with bed elevated to assist.     Recommendations:  - Continue IVIG 400 mg/kg qd x 5 days. Today is day 2/5.   - B12 in 400s, given his symptoms, he is now on supplementation  - Vit E and copper levels have been ordered and are pending.   - He will likely need short term rehab for a few weeks and maybe CIR - Continue daily PT and OT - Respiratory therapy consult for baseline FVC and NIF    LOS: 1 day   @Electronically  signed: Dr. Kerney Elbe 04/13/2021  10:24 AM

## 2021-04-14 MED ORDER — LOPERAMIDE HCL 2 MG PO CAPS
2.0000 mg | ORAL_CAPSULE | Freq: Once | ORAL | Status: AC
  Administered 2021-04-14: 2 mg via ORAL
  Filled 2021-04-14: qty 1

## 2021-04-14 MED ORDER — DIAZEPAM 5 MG/ML IJ SOLN
10.0000 mg | Freq: Four times a day (QID) | INTRAMUSCULAR | Status: DC | PRN
  Administered 2021-04-15: 10 mg via INTRAVENOUS
  Filled 2021-04-14: qty 2

## 2021-04-14 NOTE — Progress Notes (Signed)
RT NOTE: Patient performed NIF/VC with great effort with following results:  [id number] than -40 VC: 2.24L  Vitals are stable. RT will continue to monitor.

## 2021-04-14 NOTE — Progress Notes (Signed)
Inpatient Rehab Admissions:  Inpatient Rehab Consult received.  I met with patient and son at the bedside for rehabilitation assessment and to discuss goals and expectations of an inpatient rehab admission.  Both acknowledged understanding of CIR goals and expectations. Both interested in pt pursuing CIR. Pt and son confirmed that wife and both sons will be able to provide assistance/supervision for pt after discharge. Will continue to follow.  Signed: Gayland Curry, [place], [place] Admissions Coordinator [phone]

## 2021-04-14 NOTE — Progress Notes (Signed)
Patient performed NIF/VC with great effort with following results:   RS:[id number] than -40 VC: 2.26L

## 2021-04-14 NOTE — Progress Notes (Signed)
Progress Note    Tony Mcintosh  [id number] DOB: 1946/02/23  DOA: 04/11/2021 PCP: [facility]    Brief Narrative:     Medical records reviewed and are as summarized below:  DARRYUS Mcintosh is an 76 y.o. male with medical history significant for lumbar spinal stenosis, peripheral polyneuropathy, chronic back pain, restless leg syndrome, BPH, who is admitted to [facility] on 04/11/2021 for further evaluation and management of presenting numbness/weakness involving the bilateral lower extremities. s/p LP.  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  Plan for 5 days of IVIG.     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  Does not appear to be associated with acute ischemic CVA, while noting CT head showed no evidence of acute intracranial process, including no evidence of intracranial hemorrhage.   -Underwent MRI of the cervical, thoracic, and lumbar spine, with ensuing neurosurgical consultation, as further detailed above, rendering perception of chronic findings, without radiographic evidence of objective acute findings, nor any evidence to suggest the need for urgent or emergent operative decompression of the cervical, thoracic, or lumbar spine, as further detailed above.   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 -IVIG x 5 days -PT recommends CIR   -check ABI (symptoms sounds more like neuropathy though) -check Fe panel  Restless leg syndrome, -on scheduled Mirapex as an outpatient- takes at 4:30 pm and 6:30 pm   Benign Prostatic Hyperplasia:   -on tamsulosin/vibegron as outpatient.    Obstructive sleep apnea:  -nocturnal CPAP.  obesity Body mass index is 43.97 kg/m.  Arthritis -was started on prednisone and methotrexate -hold methotrexate for now -wean off prednisone and monitor symptoms -follows with Dr. Scarlette Shorts in [place] Communication/Anticipated D/C date and plan/Code Status   DVT prophylaxis: Lovenox ordered. Code Status: Full Code.  Disposition Plan: Status is: inpt  The patient will require care spanning > 2 midnights and should be moved to inpatient because: further work up        Medical Consultants:   NS neurology   Subjective:   On side of bed.  Says upper symptoms are better but lower legs feels like logs/bricks Reports a lot of issues last PM in regards to his restless legs  Objective:    Vitals:   04/14/21 0429 04/14/21 0500 04/14/21 0728 04/14/21 0807  BP: 135/74  131/84   Pulse: 63  (!) 59 63  Resp: 16  16 18   Temp: 98.1 F (36.7 C)  (!) 97.5 F (36.4 C)   TempSrc: Oral  Oral   SpO2: 92%  98% 95%  Weight:  (!) 139 kg    Height:        Intake/Output Summary (Last 24 hours) at 04/14/2021 1305 Last data filed at 04/14/2021 0443 Gross per 24  hour  Intake --  Output 500 ml  Net -500 ml   Filed Weights   04/12/21 2020 04/13/21 0547 04/14/21 0500  Weight: (!) 136.5 kg (!) 138.1 kg (!) 139 kg    Exam:   General: Appearance:    Severely obese male in no acute distress     Lungs:     respirations unlabored  Heart:    Normal heart rate.    MS:   All extremities are intact.    Neurologic:   Awake, alert, oriented  x 3. No apparent focal neurological           defect.          Data Reviewed:   I have personally reviewed following labs and imaging studies:  Labs: Labs show the following:   Basic Metabolic Panel: Recent Labs  Lab 04/11/21 0905 04/11/21 0943 04/12/21 0212  NA 133* 137 136  K 3.7 3.8 4.2  CL 97* 101 101  CO2 25  --  23  GLUCOSE 98 97 153*  BUN 17 20 17   CREATININE 0.99 0.90 0.90  CALCIUM 9.2  --  8.9  MG  --   --  2.0   GFR Estimated Creatinine Clearance: 98.2 mL/min (by C-G formula based on SCr of 0.9 mg/dL). Liver Function Tests: Recent Labs  Lab 04/11/21 0905 04/12/21 0212  AST 29 29  ALT 34 30  ALKPHOS 67 59  BILITOT 0.9 1.1  PROT 7.5 6.7  ALBUMIN 4.2 3.6   No results for input(s): LIPASE, AMYLASE in the last 168 hours. No results for input(s): AMMONIA in the last 168 hours. Coagulation profile Recent Labs  Lab 04/11/21 0905  INR 0.9    CBC: Recent Labs  Lab 04/11/21 0905 04/11/21 0943 04/12/21 0212  WBC 6.3  --  6.8  NEUTROABS 4.1  --  6.1  HGB 14.8 16.0 14.5  HCT 45.1 47.0 43.2  MCV 96.6  --  97.3  PLT 208  --  199   Cardiac Enzymes: No results for input(s): CKTOTAL, CKMB, CKMBINDEX, TROPONINI in the last 168 hours. BNP (last 3 results) No results for input(s): PROBNP in the last 8760 hours. CBG: Recent Labs  Lab 04/11/21 1114  GLUCAP 104*   D-Dimer: No results for input(s): DDIMER in the last 72 hours. Hgb A1c: Recent Labs    04/12/21 0212  HGBA1C 6.3*   Lipid Profile: No results for input(s): CHOL, HDL, LDLCALC, TRIG, CHOLHDL, LDLDIRECT in the last 72 hours. Thyroid function studies: Recent Labs    04/12/21 0212  TSH 1.065   Anemia work up: Recent Labs    04/12/21 0212 04/13/21 0805  VITAMINB12 402  --   FOLATE  --  17.7   Sepsis Labs: Recent Labs  Lab 04/11/21 0905 04/12/21 0212  WBC 6.3 6.8    Microbiology Recent Results (from the past 240 hour(s))  Resp Panel by RT-PCR (Flu A&B, Covid)  Nasopharyngeal Swab     Status: None   Collection Time: 04/11/21  8:04 PM   Specimen: Nasopharyngeal Swab; Nasopharyngeal(NP) swabs in vial transport medium  Result Value Ref Range Status   SARS Coronavirus 2 by RT PCR NEGATIVE NEGATIVE Final    Comment: (NOTE) SARS-CoV-2 target nucleic acids are NOT DETECTED.  The SARS-CoV-2 RNA is generally detectable in upper respiratory specimens during the acute phase of infection. The lowest concentration of SARS-CoV-2 viral copies this assay can detect is 138 copies/mL. A negative result does not preclude SARS-Cov-2 infection and should  not be used as the sole basis for treatment or other patient management decisions. A negative result may occur with  improper specimen collection/handling, submission of specimen other than nasopharyngeal swab, presence of viral mutation(s) within the areas targeted by this assay, and inadequate number of viral copies(<138 copies/mL). A negative result must be combined with clinical observations, patient history, and epidemiological information. The expected result is Negative.  Fact Sheet for Patients:  EntrepreneurPulse.com.au  Fact Sheet for Healthcare Providers:  IncredibleEmployment.be  This test is no t yet approved or cleared by the [place] FDA and  has been authorized for detection and/or diagnosis of SARS-CoV-2 by FDA under an Emergency Use Authorization (EUA). This EUA will remain  in effect (meaning this test can be used) for the duration of the COVID-19 declaration under Section 564(b)(1) of the Act, 21 U.S.C.section 360bbb-3(b)(1), unless the authorization is terminated  or revoked sooner.       Influenza A by PCR NEGATIVE NEGATIVE Final   Influenza B by PCR NEGATIVE NEGATIVE Final    Comment: (NOTE) The Xpert Xpress SARS-CoV-2/FLU/RSV plus assay is intended as an aid in the diagnosis of influenza from Nasopharyngeal swab specimens and should not be  used as a sole basis for treatment. Nasal washings and aspirates are unacceptable for Xpert Xpress SARS-CoV-2/FLU/RSV testing.  Fact Sheet for Patients: EntrepreneurPulse.com.au  Fact Sheet for Healthcare Providers: IncredibleEmployment.be  This test is not yet approved or cleared by the [place] FDA and has been authorized for detection and/or diagnosis of SARS-CoV-2 by FDA under an Emergency Use Authorization (EUA). This EUA will remain in effect (meaning this test can be used) for the duration of the COVID-19 declaration under Section 564(b)(1) of the Act, 21 U.S.C. section 360bbb-3(b)(1), unless the authorization is terminated or revoked.  Performed at [facility], [place] [address], [place], [place] [address]     Procedures and diagnostic studies:  No results found.  Medications:    B-complex with vitamin C  1 tablet Oral BID   cyanocobalamin  1,000 mcg Intramuscular QHS   folic acid  1 mg Oral Daily   gabapentin  300 mg Oral QHS   pramipexole  1 mg Oral BID   predniSONE  2.5 mg Oral Daily   tamsulosin  0.4 mg Oral QHS   Vibegron  75 mg Oral Daily   Continuous Infusions:  Immune Globulin 10% 55 g (04/12/21 2111)     LOS: 2 days   Geradine Girt  [facility]   How to contact the [facility] Attending or Consulting provider [place] or covering provider during after hours [place], for this patient?  Check the care team in [facility] and look for a) attending/consulting [facility] provider listed and b) the [facility] team listed Log into [email or web address] and use [place] universal password to access. If you do not have the password, please contact the hospital operator. Locate the [facility] provider you are looking for under [facility] and page to a number that you can be directly reached. If you still have difficulty reaching the provider, please page the [facility] (Director on Call) for the Hospitalists listed on amion for  assistance.  04/14/2021, 1:05 PM

## 2021-04-14 NOTE — PMR Pre-admission (Signed)
PMR Admission Coordinator Pre-Admission Assessment  Patient: Tony Mcintosh is an 76 y.o., male MRN: [id number] DOB: [birth year] Height: 5' 10"  (177.8 cm) Weight: (!) 138.3 kg  Insurance Information HMO:     PPO:      PCP:      IPA:      80/20:      OTHER:  PRIMARY: [place]    Policy#: [id number]      Subscriber: patient CM Name: Osker Mason      Phone#: [phone] [place]     Fax#: [phone] Pre-Cert#: [id number]      Employer: Retired Benefits:  Phone #: [phone]     Name:  Irene Shipper. Date: Verified eligibility 04/19/21     Deduct:     Out of Pocket Max:       Life Max:  CIR:       SNF:  Outpatient:      Co-Pay:  Home Health:       Co-Pay:  DME:      Co-Pay:  Providers: in-network  SECONDARY: BCBS/Federal Employee       Policy#: [id number]     Phone#: [phone]  Financial Counselor:       Phone#:   The Data Collection Information Summary for patients in Inpatient Rehabilitation Facilities with attached Privacy Act [place] Records was provided and verbally reviewed with: N/A  Emergency Contact Information Contact Information     Name Relation Home Work Mobile   Okimoto,Cathy Spouse   [phone]   Narciso, [place]   [phone]       Current Medical History  Patient Admitting Diagnosis: Guillain-Barre syndrome  History of Present Illness: Pt is a 76 year old male with medical hx significant for: back surgery (2011), peripheral neuropathy, chronic back pain, restless leg syndrome, BPH, lumbar spinal stenosis. Pt presented to hospital on 04/11/21 d/t numbness/weakness progressing down his body. Pt reports it started in his neck and has progressed down to his bilateral lower extremities with an element of ascension of symptoms. CT head negative for acute intracranial abnormalities. MRI of the cervical, thoracic, and lumbar spine revealed no acute findings. Neurosurgery consulted and felt images and clinical exam were suggestive  of chronic processes; no emergent or urgent operative intervention recommended. Neurology recommended IR consultation for fluoroscopic guided lumbar puncture. LP results came back with high protein and no WBCs which is consistent with diagnosis of Guillain-Barre syndrome. Pt given IVIG x5 days. Therapy evaluations completed and CIR recommended d/t pt's deficits in functional mobility and ability complete ADLs independently.  Complete NIHSS TOTAL: 0  Patient's medical [facility] from [facility] has been reviewed by the rehabilitation admission coordinator and physician.  Past Medical History  Past Medical History:  Diagnosis Date   Back complaints    BPH (benign prostatic hyperplasia)    Chronic back pain    Lumbar stenosis    Osteoporosis    Vitamin D deficiency     Has the patient had major surgery during 100 days prior to admission? No  Family History   family history includes Stroke in his father.  Current Medications  Current Facility-Administered Medications:    acetaminophen (TYLENOL) tablet 650 mg, 650 mg, Oral, Q6H PRN, 650 mg at 04/19/21 0515 **OR** acetaminophen (TYLENOL) suppository 650 mg, 650 mg, Rectal, Q6H PRN, Howerter, Justin B, DO   acidophilus (RISAQUAD) capsule 1 capsule, 1 capsule, Oral, Daily, Vann, Jessica U, DO, 1 capsule at 04/19/21 1116   B-complex with vitamin C tablet 1  tablet, 1 tablet, Oral, BID, Geradine Girt, DO, 1 tablet at 04/19/21 1114   bisacodyl (DULCOLAX) suppository 10 mg, 10 mg, Rectal, Daily PRN, Darliss Cheney, MD, 10 mg at 04/18/21 2059   diazepam (VALIUM) injection 5-10 mg, 5-10 mg, Intravenous, Q6H PRN, Vann, Jessica U, DO, 10 mg at 04/18/21 2223   docusate sodium (COLACE) capsule 100 mg, 100 mg, Oral, Daily PRN, Vann, Jessica U, DO, 100 mg at 04/18/21 1015   fentaNYL (SUBLIMAZE) injection 25 mcg, 25 mcg, Intravenous, Q4H PRN, Kirby-Graham, Karsten Fells, NP, 25 mcg at 09/47/09 6283   folic acid (FOLVITE) tablet 1 mg, 1 mg, Oral, Daily,  Vann, Jessica U, DO, 1 mg at 04/19/21 1115   gabapentin (NEURONTIN) capsule 300 mg, 300 mg, Oral, QHS, Howerter, Justin B, DO, 300 mg at 04/18/21 2221   loperamide (IMODIUM) capsule 2 mg, 2 mg, Oral, PRN, Vann, Jessica U, DO, 2 mg at 04/16/21 1042   naloxone (NARCAN) injection 0.4 mg, 0.4 mg, Intravenous, PRN, Howerter, Justin B, DO   pramipexole (MIRAPEX) tablet 1.5 mg, 1.5 mg, Oral, Daily, Kirby-Graham, Karsten Fells, NP, 1.5 mg at 04/19/21 1115   pramipexole (MIRAPEX) tablet 1.5 mg, 1.5 mg, Oral, q1600, Vann, Jessica U, DO, 1.5 mg at 04/18/21 1648   predniSONE (DELTASONE) tablet 2.5 mg, 2.5 mg, Oral, Daily, Vann, Jessica U, DO, 2.5 mg at 04/19/21 1115   tamsulosin (FLOMAX) capsule 0.4 mg, 0.4 mg, Oral, QHS, Howerter, Justin B, DO, 0.4 mg at 04/18/21 2221   Vibegron TABS 75 mg, 75 mg, Oral, Daily, Vann, Jessica U, DO, 75 mg at 04/19/21 1116  Patients Current Diet:  Diet Order             Diet regular Room service appropriate? Yes; Fluid consistency: Thin  Diet effective now                   Precautions / Restrictions Precautions Precautions: Fall Precaution Comments: L knee buckles Restrictions Weight Bearing Restrictions: No   Has the patient had 2 or more falls or a fall with injury in the past year? Yes  Prior Activity Level Community (5-7x/wk): drives, gets out of house daily  Prior Functional Level Self Care: Did the patient need help bathing, dressing, using the toilet or eating? Needed some help  Indoor Mobility: Did the patient need assistance with walking from room to room (with or without device)? Needed some help (pt reported can walk for 5 minutes at a time)  Stairs: Did the patient need assistance with internal or external stairs (with or without device)? Dependent (has a lift at home)  Functional Cognition: Did the patient need help planning regular tasks such as shopping or remembering to take medications? Independent  Patient Information Are you of Hispanic,  Latino/a,or Spanish origin?: A. No, not of Hispanic, Latino/a, or Spanish origin What is your race?: A. White Do you need or want an interpreter to communicate with a doctor or health care staff?: 0. No  Patient's Response To:  Health Literacy and Transportation Is the patient able to respond to health literacy and transportation needs?: Yes Health Literacy - How often do you need to have someone help you when you read instructions, pamphlets, or other written material from your doctor or pharmacy?: Never In the past 12 months, has lack of transportation kept you from medical appointments or from getting medications?: Yes (due to pt's limited ability to ambulate, parking at some medical appointments and inability to take power wheelchair has caused him  to miss a few appointments in the past year) In the past 12 months, has lack of transportation kept you from meetings, work, or from getting things needed for daily living?: No  Development worker, international aid / [place] Devices/Equipment: Environmental consultant (specify type) Home Equipment: Transport planner, Conservation officer, nature (2 wheels), [employer] - single point  Prior Device Use: Indicate devices/aids used by the patient prior to current illness, exacerbation or injury? Motorized wheelchair or scooter, Mechanical lift, and cane  Current Functional Level Cognition  Overall Cognitive Status: Within Functional Limits for tasks assessed Orientation Level: Oriented X4 General Comments: pt well aware of his limits    Extremity Assessment (includes Sensation/Coordination)  Upper Extremity Assessment: RUE deficits/detail, LUE deficits/detail RUE Deficits / Details: 5/5 all areas LUE Deficits / Details: shoulder 4/5, elbow 4/5, hand 5/5  Lower Extremity Assessment: Generalized weakness    ADLs  Overall ADL's : Needs assistance/impaired Eating/Feeding: Set up, Sitting Grooming: Set up, Sitting Upper Body Bathing: Minimal assistance, Sitting Lower Body  Bathing: Total assistance, +2 for safety/equipment, Sit to/from stand Upper Body Dressing : Set up, Sitting Lower Body Dressing: Total assistance, Sit to/from stand Toilet Transfer: +2 for safety/equipment, Moderate assistance, Ambulation, Rolling walker (2 wheels) Toileting- Clothing Manipulation and Hygiene: Total assistance, Sit to/from stand Functional mobility during ADLs: +2 for safety/equipment, Minimal assistance    Mobility  General bed mobility comments: patient received sitting on side of bed    Transfers  Overall transfer level: Needs assistance Equipment used: Rolling walker (2 wheels), Ambulation equipment used Transfers: Sit to/from Stand Sit to Stand: +2 physical assistance, Mod assist, Min assist Bed to/from chair/wheelchair/BSC transfer type:: Via Lift equipment Step pivot transfers: Min assist, +2 safety/equipment Transfer via Lift Equipment: Stedy General transfer comment: from elevated surface x 5, cues for hand placement, use of momentum, progressed from max to mod +2 for safety. Pt could not take steps today due to left LE weakness and pain.  He tried multiple attempts with RW and then obtained Stedy and pt worked on sit to stand with Stedy and at least shifting weight side to side and attemptimg to raise the feet off the [place]. Pt very weak today and could not safely transfer. Placed heat to hamstring to see if that helps.    Ambulation / Gait / Stairs / Wheelchair Mobility  Ambulation/Gait Ambulation/Gait assistance: Min assist, +2 safety/equipment Gait Distance (Feet): 15 Feet Assistive device: Rolling walker (2 wheels) Gait Pattern/deviations: Step-through pattern, Knee hyperextension - left, Wide base of support, Trunk flexed General Gait Details: Slow, unsteady gait with bil knee instability (left>right) with left knee hyperextensiond uring stance phase, heavy reliance on UEs, looking down due to no sensation in LEs/feet. Gait velocity: decreased    Posture /  Balance Balance Overall balance assessment: Needs assistance Sitting-balance support: Feet supported Sitting balance-Leahy Scale: Good Standing balance support: During functional activity Standing balance-Leahy Scale: Poor Standing balance comment: unable to release walker or stedy in static standing    Special needs/care consideration none   Previous Home Environment (from acute therapy documentation) Living Arrangements: Spouse/significant other  Lives With: Spouse Available Help at Discharge: Family, Available 24 hours/day Type of Home: House Home Layout: Two level Alternate Level Stairs-Rails:  (has an outdoor lift to get to second level of house) Alternate Level Stairs-Number of Steps: uses outdoor elevator to get to second level of house Home Access: Level entry, Stairs to enter (ground level at back) Entrance Stairs-Rails:  (useds outdoor lift to get to second level  of house) Bathroom Shower/Tub: Public librarian, Multimedia programmer: Standard Bathroom Accessibility: Yes How Accessible: Accessible via walker [place]: No  Discharge Living Setting Plans for Discharge Living Setting: Patient's home Type of Home at Discharge: House Discharge Home Layout: Two level Alternate Level Stairs-Rails:  (uses outdoor lift to get to second level) Discharge Home Access: Stairs to enter, Level entry (5 steps in front; ground level in back of house) Entrance Stairs-Rails:  (uses outdoor lift to get to second level of house) Discharge Bathroom Shower/Tub: Tub/shower unit, Horticulturist, commercial: Standard Discharge Bathroom Accessibility: Yes How Accessible: Accessible via walker (pt uses cane inside house for short distances) Does the patient have any problems obtaining your medications?: No  Social/Family/Support Systems Patient Roles: Spouse Anticipated Caregiver: Trammell Bowden, wife; 2 sons Anticipated Caregiver's Contact Information:  Tye [place]: [phone] Caregiver Availability: 24/7 Discharge Plan Discussed with Primary Caregiver: Yes Is Caregiver In Agreement with Plan?: Yes Does Caregiver/Family have Issues with Lodging/Transportation while Pt is in Rehab?: No  Goals Patient/Family Goal for Rehab: Supervision: PT/OT Expected length of stay: 18-21 days Pt/Family Agrees to Admission and willing to participate: Yes Program Orientation Provided & Reviewed with Pt/Caregiver Including Roles  & Responsibilities: Yes  Decrease burden of Care through IP rehab admission: NA  Possible need for SNF placement upon discharge: Not anticipated  Patient Condition: I have reviewed medical records from [facility], spoken with CM, and patient, spouse, and son. I met with patient at the bedside for inpatient rehabilitation assessment.  Patient will benefit from ongoing PT and OT, can actively participate in 3 hours of therapy a day 5 days of the week, and can make measurable gains during the admission.  Patient will also benefit from the coordinated team approach during an Inpatient Acute Rehabilitation admission.  The patient will receive intensive therapy as well as Rehabilitation physician, nursing, social worker, and care management interventions.  Due to bladder management, safety, disease management, medication administration, pain management, and patient education the patient requires 24 hour a day rehabilitation nursing.  The patient is currently mod-max with mobility and basic ADLs.  Discharge setting and therapy post discharge at home with home health is anticipated.  Patient has agreed to participate in the Acute Inpatient Rehabilitation Program and will admit today.  Preadmission Screen Completed By:  Bethel Born, 04/19/2021 1:43 PM, with day of updates by Clemens Catholic, MS, CCC-SLP   ______________________________________________________________________   Discussed status with Dr. Ranell Patrick  on 04/20/21 at 17 and  received approval for admission today.  Admission Coordinator:  Bethel Born, CCC-SLP, time 930/Date 04/20/21   Assessment/Plan: Diagnosis: GBS Does the need for close, 24 hr/day Medical supervision in concert with the patient's rehab needs make it unreasonable for this patient to be served in a less intensive setting? Yes Co-Morbidities requiring supervision/potential complications: morbid obesity, lower extremity weakness, left knee pain and swelling, left thumb pain and swelling, BPH Due to bladder management, bowel management, safety, skin/wound care, disease management, medication administration, pain management, and patient education, does the patient require 24 hr/day rehab nursing? Yes Does the patient require coordinated care of a physician, rehab nurse, PT, OT to address physical and functional deficits in the context of the above medical diagnosis(es)? Yes Addressing deficits in the following areas: balance, endurance, locomotion, strength, transferring, bowel/bladder control, bathing, dressing, feeding, grooming, toileting, and psychosocial support Can the patient actively participate in an intensive therapy program of at least 3 hrs of therapy 5  days a week? Yes The potential for patient to make measurable gains while on inpatient rehab is excellent Anticipated functional outcomes upon discharge from inpatient rehab: min assist PT, min assist OT, independent SLP Estimated rehab length of stay to reach the above functional goals is: 14-18 days Anticipated discharge destination: Home 10. Overall Rehab/Functional Prognosis: excellent   MD Signature: Leeroy Cha, MD

## 2021-04-15 ENCOUNTER — Encounter (HOSPITAL_COMMUNITY): Payer: No Typology Code available for payment source

## 2021-04-15 LAB — FERRITIN: Ferritin: 344 ng/mL — ABNORMAL HIGH (ref 24–336)

## 2021-04-15 LAB — IRON AND TIBC
Iron: 46 ug/dL (ref 45–182)
Saturation Ratios: 15 % — ABNORMAL LOW (ref 17.9–39.5)
TIBC: 305 ug/dL (ref 250–450)
UIBC: 259 ug/dL

## 2021-04-15 NOTE — Progress Notes (Signed)
Progress Note    Tony Mcintosh  ZOX:[id number] DOB: 1945-09-28  DOA: 04/11/2021 PCP: [facility]    Brief Narrative:     Medical records reviewed and are as summarized below:  Tony Mcintosh is an 76 y.o. male with medical history significant for lumbar spinal stenosis, peripheral polyneuropathy, chronic back pain, restless leg syndrome, BPH, who is admitted to [facility] on 04/11/2021 for further evaluation and management of presenting numbness/weakness involving the bilateral lower extremities. s/p LP.  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  Plan for 5 days of IVIG.  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 -IVIG x 5 days -PT recommends CIR -check ABI (symptoms sounds more like neuropathy though) -fe levels ok  Restless leg syndrome, -on scheduled Mirapex as an outpatient- takes at 4:30 pm and 6:30 pm   Benign Prostatic Hyperplasia:   -on tamsulosin/vibegron as outpatient.    Obstructive sleep apnea:  -nocturnal CPAP.  obesity Body mass index is 43.97 kg/m.  Arthritis -was started on prednisone and methotrexate -hold methotrexate for now -wean down prednisone -follows with Dr. Octaviano Glow in [place]   Family Communication/Anticipated D/C date and plan/Code Status    DVT prophylaxis: Lovenox ordered. Code Status: Full Code.  Disposition Plan: Status is: inpt  The patient will require care spanning > 2 midnights and should be moved to inpatient because: further work up, CIR placement        Medical Consultants:   NS neurology   Subjective:   No SOB, last night was better, some jerking this AM  Objective:    Vitals:   04/14/21 1753 04/14/21 1943 04/15/21 0403 04/15/21 0752  BP: 128/87 (!) 129/107 140/85 (!) 149/94  Pulse: 77 69 63 69  Resp: Temp: 98.2 F (36.8 C) 98.3 F (36.8 C) 97.9 F (36.6 C) 98.3 F (36.8 C)  TempSrc: Oral Oral  Oral  SpO2: 95% 94% 94% 96%  Weight:      Height:        Intake/Output Summary (Last 24 hours) at 04/15/2021 1245 Last data filed at 04/15/2021 0730 Gross per 24 hour  Intake 2370 ml  Output 450 ml  Net 1920 ml   Filed Weights   04/12/21 2020 04/13/21 0547 04/14/21 0500  Weight: (!) 136.5 kg (!) 138.1 kg (!) 139 kg    Exam:   General: Appearance:    Severely obese male in no acute distress     Lungs:     respirations unlabored  Heart:    Normal heart rate.   MS:   All extremities are intact.    Neurologic:   Awake, alert, oriented x 3           Data Reviewed:   I have personally reviewed following labs and imaging studies:  Labs: Labs show the  following:   Basic Metabolic Panel: Recent Labs  Lab 04/11/21 0905 04/11/21 0943 04/12/21 0212  NA 133* 137 136  K 3.7 3.8 4.2  CL 97* 101 101  CO2 25  --  23  GLUCOSE 98 97 153*  BUN CREATININE 0.99 0.90 0.90  CALCIUM 9.2  --  8.9  MG  --   --  2.0   GFR Estimated Creatinine Clearance: 98.2 mL/min (by C-G formula based on SCr of 0.9 mg/dL). Liver Function Tests: Recent Labs  Lab 04/11/21 0905 04/12/21 0212  AST 29 29  ALT 34 30  ALKPHOS 67 59  BILITOT 0.9 1.1  PROT 7.5 6.7  ALBUMIN 4.2 3.6   No results for input(s): LIPASE, AMYLASE in the last 168 hours. No results for input(s):  AMMONIA in the last 168 hours. Coagulation profile Recent Labs  Lab 04/11/21 0905  INR 0.9    CBC: Recent Labs  Lab 04/11/21 0905 04/11/21 0943 04/12/21 0212  WBC 6.3  --  6.8  NEUTROABS 4.1  --  6.1  HGB 14.8 16.0 14.5  HCT 45.1 47.0 43.2  MCV 96.6  --  97.3  PLT 208  --  199   Cardiac Enzymes: No results for input(s): CKTOTAL, CKMB, CKMBINDEX, TROPONINI in the last 168 hours. BNP (last 3 results) No results for input(s): PROBNP in the last 8760 hours. CBG: Recent Labs  Lab 04/11/21 1114  GLUCAP 104*   D-Dimer: No results for input(s): DDIMER in the last 72 hours. Hgb A1c: No results for input(s): HGBA1C in the last 72 hours.  Lipid Profile: No results for input(s): CHOL, HDL, LDLCALC, TRIG, CHOLHDL, LDLDIRECT in the last 72 hours. Thyroid function studies: No results for input(s): TSH, T4TOTAL, T3FREE, THYROIDAB in the last 72 hours.  Invalid input(s): FREET3  Anemia work up: Recent Labs    04/13/21 0805 04/15/21 0130  FOLATE 17.7  --   FERRITIN  --  344*  TIBC  --  305  IRON  --  46   Sepsis Labs: Recent Labs  Lab 04/11/21 0905 04/12/21 0212  WBC 6.3 6.8    Microbiology Recent Results (from the past 240 hour(s))  Resp Panel by RT-PCR (Flu A&B, Covid) Nasopharyngeal Swab     Status: None   Collection Time: 04/11/21  8:04 PM   Specimen: Nasopharyngeal Swab; Nasopharyngeal(NP) swabs in vial transport medium  Result Value Ref Range Status   SARS Coronavirus 2 by RT PCR NEGATIVE NEGATIVE Final    Comment: (NOTE) SARS-CoV-2 target nucleic acids are NOT DETECTED.  The SARS-CoV-2 RNA is generally detectable in upper respiratory specimens during the acute phase of infection. The lowest concentration of SARS-CoV-2 viral copies this assay can detect is 138 copies/mL. A negative result does not preclude SARS-Cov-2 infection and should not be used as the sole basis for treatment or other patient management decisions. A negative result may occur with   improper specimen collection/handling, submission of specimen other than nasopharyngeal swab, presence of viral mutation(s) within the areas targeted by this assay, and inadequate number of viral copies(<138 copies/mL). A negative result must be combined with clinical observations, patient history, and epidemiological information. The expected result is Negative.  Fact Sheet for Patients:  BloggerCourse.com  Fact Sheet for Healthcare Providers:  SeriousBroker.it  This test is no t yet approved or cleared by the [place] FDA and  has been authorized for detection and/or diagnosis of SARS-CoV-2 by FDA under an Emergency Use Authorization (EUA). This  EUA will remain  in effect (meaning this test can be used) for the duration of the COVID-19 declaration under Section 564(b)(1) of the Act, 21 U.S.C.section 360bbb-3(b)(1), unless the authorization is terminated  or revoked sooner.       Influenza A by PCR NEGATIVE NEGATIVE Final   Influenza B by PCR NEGATIVE NEGATIVE Final    Comment: (NOTE) The Xpert Xpress SARS-CoV-2/FLU/RSV plus assay is intended as an aid in the diagnosis of influenza from Nasopharyngeal swab specimens and should not be used as a sole basis for treatment. Nasal washings and aspirates are unacceptable for Xpert Xpress SARS-CoV-2/FLU/RSV testing.  Fact Sheet for Patients: BloggerCourse.comhttps://www.fda.gov/media/152166/download  Fact Sheet for Healthcare Providers: SeriousBroker.ithttps://www.fda.gov/media/152162/download  This test is not yet approved or cleared by the [place] FDA and has been authorized for detection and/or diagnosis of SARS-CoV-2 by FDA under an Emergency Use Authorization (EUA). This EUA will remain in effect (meaning this test can be used) for the duration of the COVID-19 declaration under Section 564(b)(1) of the Act, 21 U.S.C. section 360bbb-3(b)(1), unless the authorization is terminated  or revoked.  Performed at [facility], [address] [place], [place] [address]     Procedures and diagnostic studies:  No results found.  Medications:    B-complex with vitamin C  1 tablet Oral BID   folic acid  1 mg Oral Daily   gabapentin  300 mg Oral QHS   pramipexole  1 mg Oral BID   predniSONE  2.5 mg Oral Daily   tamsulosin  0.4 mg Oral QHS   Vibegron  75 mg Oral Daily   Continuous Infusions:  Immune Globulin 10% 55 g (04/14/21 1611)     LOS: 3 days   Joseph ArtJessica U Breta Demedeiros  [facility]   How to contact the [facility] Attending or Consulting provider 7A - 7P or covering provider during after hours 7P -7A, for this patient?  Check the care team in [facility] and look for a) attending/consulting TRH provider listed and b) the [facility] team listed Log into [email or web address] and use [place]'s universal password to access. If you do not have the password, please contact the hospital operator. Locate the [facility] provider you are looking for under [facility] and page to a number that you can be directly reached. If you still have difficulty reaching the provider, please page the [facility] (Director on Call) for the Hospitalists listed on amion for assistance.  04/15/2021, 12:45 PM

## 2021-04-15 NOTE — Progress Notes (Signed)
Patient perfomed NIF/VC with good effort.  NIF: greater than -40 VC: 1.9L

## 2021-04-15 NOTE — Progress Notes (Signed)
Patient has home CPAP set up at bedside.Patient states no assistance needed form RT. Family member at bedside in agreement. Patient aware to call for assistance if needed.

## 2021-04-15 NOTE — Progress Notes (Signed)
RT NOTE: Patient performed NIF/VC with great effort with following results:  NIF: greater than -40 VC: 3.96L

## 2021-04-16 LAB — IGG CSF INDEX
Albumin CSF-mCnc: 28 mg/dL (ref 15–55)
Albumin: 4.1 g/dL (ref 3.7–4.7)
CSF IgG Index: 0.6 (ref 0.0–0.7)
IgG (Immunoglobin G), Serum: 975 mg/dL (ref 603–1613)
IgG, CSF: 4 mg/dL (ref 0.0–10.3)
IgG/Alb Ratio, CSF: 0.14 (ref 0.00–0.25)

## 2021-04-16 MED ORDER — PRAMIPEXOLE DIHYDROCHLORIDE 1.5 MG PO TABS
1.5000 mg | ORAL_TABLET | Freq: Every day | ORAL | Status: DC
  Administered 2021-04-16 – 2021-04-19 (×4): 1.5 mg via ORAL
  Filled 2021-04-16 (×5): qty 1

## 2021-04-16 MED ORDER — FENTANYL CITRATE PF 50 MCG/ML IJ SOSY
25.0000 ug | PREFILLED_SYRINGE | INTRAMUSCULAR | Status: DC | PRN
  Administered 2021-04-18 – 2021-04-19 (×5): 25 ug via INTRAVENOUS
  Filled 2021-04-16 (×6): qty 1

## 2021-04-16 MED ORDER — PRAMIPEXOLE DIHYDROCHLORIDE 0.125 MG PO TABS
1.0000 mg | ORAL_TABLET | Freq: Every day | ORAL | Status: DC
  Administered 2021-04-17: 1 mg via ORAL
  Filled 2021-04-16: qty 8

## 2021-04-16 MED ORDER — RISAQUAD PO CAPS
1.0000 | ORAL_CAPSULE | Freq: Every day | ORAL | Status: DC
  Administered 2021-04-16 – 2021-04-20 (×5): 1 via ORAL
  Filled 2021-04-16 (×5): qty 1

## 2021-04-16 MED ORDER — LOPERAMIDE HCL 2 MG PO CAPS
2.0000 mg | ORAL_CAPSULE | ORAL | Status: DC | PRN
  Administered 2021-04-16: 2 mg via ORAL
  Filled 2021-04-16: qty 1

## 2021-04-16 NOTE — Progress Notes (Signed)
Progress Note    Marden NobleDale R Finnie  UEA:[id number] DOB: 1945-11-07  DOA: 04/11/2021 [facility]    Brief Narrative:     Medical records reviewed and are as summarized below:  Marden NobleDale R Graziosi is an 76 y.o. male with medical history significant for lumbar spinal stenosis, peripheral polyneuropathy, chronic back pain, restless leg syndrome, BPH, who is [facility] 04[facility]11[facility]2021 for further evaluation and management of presenting numbness/weakness involving the bilateral lower extremities. s/p LP.  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  Plan for 5 days of IVIG.  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 -IVIG x 5 days -PT recommends CIR -check ABI (symptoms sounds more like neuropathy though) -FE levels ok  Restless leg syndrome, -on scheduled Mirapex as an outpatient- takes at 4:30 pm and 6:30 pm   Benign Prostatic Hyperplasia:   -on tamsulosin/vibegron as outpatient.    Obstructive sleep apnea:  -nocturnal CPAP.  obesity Body mass index is 43.97 kg/m.  Arthritis -was started on prednisone and methotrexate -hold methotrexate for now -wean down prednisone -follows with Dr. Octaviano GlowShroff in [place]   Family Communication/Anticipated D/C date and plan/Code Status    DVT prophylaxis: Lovenox ordered. Code Status: Full Code.  Disposition Plan: Status is: inpt  The patient will require care spanning > 2 midnights and should be moved to inpatient because: further work up, CIR placement        Medical Consultants:   NS neurology   Subjective:   Asking about rehab timing-- tired after working with PT -still having loose stools  Objective:    Vitals:   04/15/21 0752 04/15/21 1527 04/15/21 1955 04/16/21 0736  BP: (!) 149/94 135/71 (!) 144/72 136/81  Pulse: 69 66 70 66  Resp: 18 17 18 16   Temp: 98.3 F (36.8 C) 98.3 F (36.8 C) 98.5 F (36.9 C) 97.6 F (36.4 C)  TempSrc: Oral Oral Oral Oral  SpO2: 96% 96% 96% 98%  Weight:      Height:        Intake/Output Summary (Last 24 hours) at 04/16/2021 1200 Last data filed at 04/15/2021 2306 Gross per 24 hour  Intake 240 ml  Output 745 ml  Net -505 ml   Filed Weights   04/12/21 2020 04/13/21 0547 04/14/21 0500  Weight: (!) 136.5 kg (!) 138.1 kg (!) 139 kg    Exam:   General: Appearance:    Severely obese male in no acute distress     Lungs:     respirations unlabored  Heart:    Normal heart rate.   MS:   All extremities are intact.    Neurologic:   Awake, alert, oriented x 3           Data Reviewed:   I have personally reviewed following labs and imaging studies:  Labs:  Labs show the following:   Basic Metabolic Panel: Recent Labs  Lab 04/11/21 0905 04/11/21 0943 04/12/21 0212  NA 133* 137 136  K 3.7 3.8 4.2  CL 97* 101 101  CO2 25  --  23  GLUCOSE 98 97 153*  BUN CREATININE 0.99 0.90 0.90  CALCIUM 9.2  --  8.9  MG  --   --  2.0   GFR Estimated Creatinine Clearance: 96.6 mL/min (by C-G formula based on SCr of 0.9 mg/dL). Liver Function Tests: Recent Labs  Lab 04/11/21 0905 04/12/21 0212  AST 29 29  ALT 34 30  ALKPHOS 67 59  BILITOT 0.9 1.1  PROT 7.5 6.7  ALBUMIN 4.2 3.6   No results for input(s): LIPASE, AMYLASE in the last 168  hours. No results for input(s): AMMONIA in the last 168 hours. Coagulation profile Recent Labs  Lab 04/11/21 0905  INR 0.9    CBC: Recent Labs  Lab 04/11/21 0905 04/11/21 0943 04/12/21 0212  WBC 6.3  --  6.8  NEUTROABS 4.1  --  6.1  HGB 14.8 16.0 14.5  HCT 45.1 47.0 43.2  MCV 96.6  --  97.3  PLT 208  --  199   Cardiac Enzymes: No results for input(s): CKTOTAL, CKMB, CKMBINDEX, TROPONINI in the last 168 hours. BNP (last 3 results) No results for input(s): PROBNP in the last 8760 hours. CBG: Recent Labs  Lab 04/11/21 1114  GLUCAP 104*   D-Dimer: No results for input(s): DDIMER in the last 72 hours. Hgb A1c: No results for input(s): HGBA1C in the last 72 hours.  Lipid Profile: No results for input(s): CHOL, HDL, LDLCALC, TRIG, CHOLHDL, LDLDIRECT in the last 72 hours. Thyroid function studies: No results for input(s): TSH, T4TOTAL, T3FREE, THYROIDAB in the last 72 hours.  Invalid input(s): FREET3  Anemia work up: Recent Labs    04/15/21 0130  FERRITIN 344*  TIBC 305  IRON 46   Sepsis Labs: Recent Labs  Lab 04/11/21 0905 04/12/21 0212  WBC 6.3 6.8    Microbiology Recent Results (from the past 240 hour(s))  Resp Panel by RT-PCR (Flu A&B, Covid) Nasopharyngeal Swab     Status: None   Collection Time: 04/11/21  8:04 PM   Specimen: Nasopharyngeal Swab; Nasopharyngeal(NP) swabs in vial transport medium  Result Value Ref Range Status   SARS Coronavirus 2 by RT PCR NEGATIVE NEGATIVE Final    Comment: (NOTE) SARS-CoV-2 target nucleic acids are NOT DETECTED.  The SARS-CoV-2 RNA is generally detectable in upper respiratory specimens during the acute phase of infection. The lowest concentration of SARS-CoV-2 viral copies this assay can detect is 138 copies/mL. A negative result does not preclude SARS-Cov-2 infection and should not be used as the sole basis for treatment or other patient management decisions. A negative result may occur with  improper  specimen collection/handling, submission of specimen other than nasopharyngeal swab, presence of viral mutation(s) within the areas targeted by this assay, and inadequate number of viral copies(<138 copies/mL). A negative result must be combined with clinical observations, patient history, and epidemiological information. The expected result is Negative.  Fact Sheet for Patients:  BloggerCourse.com  Fact Sheet for Healthcare Providers:  SeriousBroker.it  This test is no t yet approved or cleared by the [place] FDA and  has been authorized for detection and/or diagnosis of SARS-CoV-2 by FDA under an Emergency Use Authorization (EUA). This EUA will remain  in effect (meaning this test can be used) for the  duration of the COVID-19 declaration under Section 564(b)(1) of the Act, 21 U.S.C.section 360bbb-3(b)(1), unless the authorization is terminated  or revoked sooner.       Influenza A by PCR NEGATIVE NEGATIVE Final   Influenza B by PCR NEGATIVE NEGATIVE Final    Comment: (NOTE) The Xpert Xpress SARS-CoV-2/FLU/RSV plus assay is intended as an aid in the diagnosis of influenza from Nasopharyngeal swab specimens and should not be used as a sole basis for treatment. Nasal washings and aspirates are unacceptable for Xpert Xpress SARS-CoV-2/FLU/RSV testing.  Fact Sheet for Patients: BloggerCourse.com  Fact Sheet for Healthcare Providers: SeriousBroker.it  This test is not yet approved or cleared by the [place] FDA and has been authorized for detection and/or diagnosis of SARS-CoV-2 by FDA under an Emergency Use Authorization (EUA). This EUA will remain in effect (meaning this test can be used) for the duration of the COVID-19 declaration under Section 564(b)(1) of the Act, 21 U.S.C. section 360bbb-3(b)(1), unless the authorization is terminated or revoked.  Performed at  [facility], [address] [place], [place] [address]     Procedures and diagnostic studies:  No results found.  Medications:    acidophilus  1 capsule Oral Daily   B-complex with vitamin C  1 tablet Oral BID   folic acid  1 mg Oral Daily   gabapentin  300 mg Oral QHS   pramipexole  1 mg Oral BID   predniSONE  2.5 mg Oral Daily   tamsulosin  0.4 mg Oral QHS   Vibegron  75 mg Oral Daily   Continuous Infusions:  Immune Globulin 10% Stopped (04/16/21 0351)     LOS: 4 days   Joseph Art  [facility]   How to contact the [facility] Attending or Consulting provider 7A - 7P or covering provider during after hours 7P -7A, for this patient?  Check the care team in [facility] and look for a) attending/consulting TRH provider listed and b) the [facility] team listed Log into [email or web address] and use [place]'s universal password to access. If you do not have the password, please contact the hospital operator. Locate the [facility] provider you are looking for under [facility] and page to a number that you can be directly reached. If you still have difficulty reaching the provider, please page the [facility] (Director on Call) for the Hospitalists listed on amion for assistance.  04/16/2021, 12:00 PM

## 2021-04-16 NOTE — Progress Notes (Incomplete)
PMR Admission Coordinator Pre-Admission Assessment   Patient: TAMARION HAYMOND is an 76 y.o., male MRN: [id number] DOB: [birth year] Height: 5' 10"  (177.8 cm) Weight: (!) 139 kg   Insurance Information HMO:     PPO:      PCP:      IPA:      80/20:      OTHER:  PRIMARY: [place]    Policy#: [id number]      Subscriber: patient CM Name: ***      Phone#: ***     Fax#: [phone] Pre-Cert#: [id number]      Employer: *** Benefits:  Phone #: [phone]     Name:  Irene Shipper. Date: ***     Deduct: ***    Out of Pocket Max: ***      Life Max: *** CIR: ***      SNF: *** Outpatient: ***     Co-Pay: *** Home Health: ***      Co-Pay: *** DME: ***     Co-Pay: *** Providers: in-network SECONDARY: BCBS/Federal Employee       Policy#: [id number]     Phone#: [phone]   Financial Counselor:       Phone#:    The Data Collection Information Summary for patients in Inpatient Rehabilitation Facilities with attached Privacy Act [place] Records was provided and verbally reviewed with: N/A   Emergency Contact Information Contact Information       Name Relation Home Work Mobile    Follette,Cathy Spouse     [phone]    Ayeden, Gladman     [phone]           Current Medical History  Patient Admitting Diagnosis: Guillain-Barre syndrome History of Present Illness: Pt is a 76 year old male with medical hx significant for: back surgery (2011), peripheral neuropathy, chronic back pain, restless leg syndrome, BPH, lumbar spinal stenosis. Pt presented to hospital on 04/11/21 d/t numbness/weakness progressing down his body. Pt reports it started in his neck and has progressed down to his bilateral lower extremities with an element of ascension of symptoms. CT head negative for acute intracranial abnormalities. MRI of the cervical, thoracic, and lumbar spine revealed no acute findings. Neurosurgery consulted and felt images and clinical exam were suggestive of chronic  processes; no emergent or urgent operative intervention recommended. Neurology recommended IR consultation for fluoroscopic guided lumbar puncture. LP results came back with high protein and no WBCs which is consistent with diagnosis of Guillain-Barre syndrome. Pt given IVIG x5 days. Therapy evaluations completed and CIR recommended d/t pt's deficits in functional mobility and ability complete ADLs independently.  Complete NIHSS TOTAL: 0   Patient's medical [facility] from [facility] has been reviewed by the rehabilitation admission coordinator and physician.   Past Medical History      Past Medical History:  Diagnosis Date   Back complaints     BPH (benign prostatic hyperplasia)     Chronic back pain     Lumbar stenosis     Osteoporosis     Vitamin D deficiency        Has the patient had major surgery during 100 days prior to admission? No   Family History   family history includes Stroke in his father.   Current Medications   Current Facility-Administered Medications:    acetaminophen (TYLENOL) tablet 650 mg, 650 mg, Oral, Q6H PRN, 650 mg at 04/11/21 2345 **OR** acetaminophen (TYLENOL) suppository 650 mg, 650 mg, Rectal, Q6H PRN, Howerter, Justin B, DO  B-complex with vitamin C tablet 1 tablet, 1 tablet, Oral, BID, Vann, Jessica U, DO, 1 tablet at 04/14/21 0850   cyanocobalamin ((VITAMIN B-12)) injection 1,000 mcg, 1,000 mcg, Intramuscular, QHS, Kirby-Graham, Karsten Fells, NP   diazepam (VALIUM) injection 10 mg, 10 mg, Intravenous, Q6H PRN, Vann, Jessica U, DO   fentaNYL (SUBLIMAZE) injection 50 mcg, 50 mcg, Intravenous, Q2H PRN, Howerter, Justin B, DO, 50 mcg at 56/21/30 8657   folic acid (FOLVITE) tablet 1 mg, 1 mg, Oral, Daily, Vann, Jessica U, DO   gabapentin (NEURONTIN) capsule 300 mg, 300 mg, Oral, QHS, Howerter, Justin B, DO, 300 mg at 04/13/21 2209   Immune Globulin 10% (PRIVIGEN) IV infusion 55 g, 400 mg/kg, Intravenous, Q24 Hr x 5, Kirby-Graham, Karen J, NP, Last Rate:  40.7 mL/hr at 04/12/21 2111, 55 g at 04/13/21 1551   naloxone (NARCAN) injection 0.4 mg, 0.4 mg, Intravenous, PRN, Howerter, Justin B, DO   pramipexole (MIRAPEX) tablet 1 mg, 1 mg, Oral, BID, Vann, Jessica U, DO, 1 mg at 04/13/21 1857   predniSONE (DELTASONE) tablet 2.5 mg, 2.5 mg, Oral, Daily, Vann, Jessica U, DO, 2.5 mg at 04/14/21 0847   tamsulosin (FLOMAX) capsule 0.4 mg, 0.4 mg, Oral, QHS, Howerter, Justin B, DO, 0.4 mg at 04/13/21 2210   Vibegron TABS 75 mg, 75 mg, Oral, Daily, Vann, Jessica U, DO, 75 mg at 04/14/21 0932   Patients Current Diet:  Diet Order                  Diet regular Room service appropriate? Yes; Fluid consistency: Thin  Diet effective now                         Precautions / Restrictions Precautions Precautions: Fall Restrictions Weight Bearing Restrictions: No    Has the patient had 2 or more falls or a fall with injury in the past year? Yes   Prior Activity Level Community (5-7x/wk): drives, gets out of house daily   Prior Functional Level Self Care: Did the patient need help bathing, dressing, using the toilet or eating? Needed some help   Indoor Mobility: Did the patient need assistance with walking from room to room (with or without device)? Needed some help (pt reported can walk for 5 minutes at a time)   Stairs: Did the patient need assistance with internal or external stairs (with or without device)? Dependent (has a lift at home)   Functional Cognition: Did the patient need help planning regular tasks such as shopping or remembering to take medications? Independent   Patient Information Are you of Hispanic, Latino/a,or Spanish origin?: A. No, not of Hispanic, Latino/a, or Spanish origin What is your race?: A. White Do you need or want an interpreter to communicate with a doctor or health care staff?: 0. No   Patient's Response To:  Health Literacy and Transportation Is the patient able to respond to health literacy and  transportation needs?: Yes Health Literacy - How often do you need to have someone help you when you read instructions, pamphlets, or other written material from your doctor or pharmacy?: Never In the past 12 months, has lack of transportation kept you from medical appointments or from getting medications?: Yes (due to pt's limited ability to ambulate, parking at some medical appointments and inability to take power wheelchair has caused him to miss a few appointments in the past year) In the past 12 months, has lack of transportation kept you from meetings,  work, or from getting things needed for daily living?: No   Development worker, international aid / Equipment Home Equipment: Transport planner, Conservation officer, nature (2 wheels), [employer] - single point   Prior Device Use: Indicate devices/aids used by the patient prior to current illness, exacerbation or injury? Motorized wheelchair or scooter, Mechanical lift, and cane   Current Functional Level Cognition   Overall Cognitive Status: Within Functional Limits for tasks assessed Orientation Level: Oriented X4    Extremity Assessment (includes Sensation/Coordination)   Upper Extremity Assessment: Generalized weakness  Lower Extremity Assessment: Generalized weakness     ADLs         Mobility   General bed mobility comments: patient received sitting up on side of bed. Reports his son assisted getting him up to edge of bed.     Transfers   Overall transfer level: Needs assistance Equipment used: Rolling walker (2 wheels) Transfers: Sit to/from Stand Sit to Stand: Mod assist, +2 physical assistance, +2 safety/equipment, From elevated surface General transfer comment: patient able to stand with my assist and neurologist who entered room. Bed very elevated and increased effort to get standing.     Ambulation / Gait / Stairs / Wheelchair Mobility   Ambulation/Gait General Gait Details: patient unsafe to attempt ambulation at this time. Marched in place with  leaning to his right with weight shifting.     Posture / Balance Balance Overall balance assessment: Needs assistance Sitting-balance support: Feet supported Sitting balance-Leahy Scale: Good Standing balance support: Bilateral upper extremity supported, During functional activity, Reliant on assistive device for balance Standing balance-Leahy Scale: Poor     Special needs/care consideration Continuous Drip IV  Immune Globulin 10% IV infusion, External urinary catheter    Previous Home Environment (from acute therapy documentation) Living Arrangements: Spouse/significant other  Lives With: Spouse Available Help at Discharge: Family, Available 24 hours/day Type of Home: House Home Layout: Two level Alternate Level Stairs-Rails:  (has an outdoor lift to get to second level of house) Alternate Level Stairs-Number of Steps: uses outdoor elevator to get to second level of house Home Access: Level entry, Stairs to enter (5 steps in front; ground level in back of house) Entrance Stairs-Rails:  (useds outdoor lift to get to second level of house) Bathroom Shower/Tub: Public librarian, Multimedia programmer: Standard Bathroom Accessibility: Yes How Accessible: Accessible via walker (pt uses cane inside house for short distances) Home Care Services: No   Discharge Living Setting Plans for Discharge Living Setting: Patient's home Type of Home at Discharge: House Discharge Home Layout: Two level Alternate Level Stairs-Rails:  (uses outdoor lift to get to second level) Discharge Home Access: Stairs to enter, Level entry (5 steps in front; ground level in back of house) Entrance Stairs-Rails:  (uses outdoor lift to get to second level of house) Discharge Bathroom Shower/Tub: Tub/shower unit, Horticulturist, commercial: Standard Discharge Bathroom Accessibility: Yes How Accessible: Accessible via walker (pt uses cane inside house for short distances) Does the patient have  any problems obtaining your medications?: No   Social/Family/Support Systems Patient Roles: Spouse Anticipated Caregiver: Haneef Hallquist, wife; 2 sons Anticipated Caregiver's Contact Information: [place]: [phone] Caregiver Availability: 24/7 Discharge Plan Discussed with Primary Caregiver: Yes Is Caregiver In Agreement with Plan?: Yes Does Caregiver/Family have Issues with Lodging/Transportation while Pt is in Rehab?: No   Goals Patient/Family Goal for Rehab: Supervision: PT/OT Expected length of stay: 12-14 days Pt/Family Agrees to Admission and willing to participate: Yes Program Orientation Provided & Reviewed with Pt/Caregiver  Including Roles  & Responsibilities: Yes   Decrease burden of Care through IP rehab admission: NA   Possible need for SNF placement upon discharge: Not anticipated   Patient Condition: I have reviewed medical records from [facility], spoken with CM, and patient, spouse, and son. I met with patient at the bedside for inpatient rehabilitation assessment.  Patient will benefit from ongoing PT and OT, can actively participate in 3 hours of therapy a day 5 days of the week, and can make measurable gains during the admission.  Patient will also benefit from the coordinated team approach during an Inpatient Acute Rehabilitation admission.  The patient will receive intensive therapy as well as Rehabilitation physician, nursing, social worker, and care management interventions.  Due to bladder management, safety, disease management, medication administration, pain management, and patient education the patient requires 24 hour a day rehabilitation nursing.  The patient is currently Min-Mod A+2 with mobility and Min-Total A with basic ADLs.  Discharge setting and therapy post discharge at home with home health is anticipated.  Patient has agreed to participate in the Acute Inpatient Rehabilitation Program and will admit {Time; today/tomorrow:10263}.    Preadmission Screen Completed By:  Bethel Born, 04/14/2021 1:19 PM ____________________________________

## 2021-04-16 NOTE — Progress Notes (Signed)
Patient has home CPAP at bedside. No assistance needed from RT at this time.  ?

## 2021-04-16 NOTE — Progress Notes (Signed)
Inpatient Rehab Admissions Coordinator:  Saw pt and sons at bedside. Informed them that awaiting insurance authorization. Will continue to follow.   Wolfgang PhoenixLauren Graves Madden, MS, CCC-SLP Admissions Coordinator [phone]

## 2021-04-16 NOTE — Progress Notes (Signed)
RT NOTES: Pt performed NIF and VC with great effort. NIF: Well above -43. VC: 2.26L.

## 2021-04-16 NOTE — Progress Notes (Signed)
Physical Therapy Treatment Patient Details Name: Tony Mcintosh MRN: [id number] DOB: [birth year] Today's Date: 04/16/2021   History of Present Illness Tony Mcintosh is a 76 y.o. male with medical history significant for lumbar spinal stenosis, peripheral polyneuropathy, chronic back pain, restless leg syndrome, BPH, who is admitted to [facility] on 04/11/2021 for further evaluation and management of presenting numbness/weakness involving the bilateral lower extremities. 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    PT Comments    Patient progressing well towards PT goals. Reports improved sensation in UEs and trunk but continues to have decreased sensation in LEs/feet to light touch. Able to feel pressure throughout BLEs. Requires assist of 2 for safety to progress gait due to poor sensation in LEs and left knee instability/buckling. Session focused on functional sit to stands and gait training with use of RW. Pt highly motivated to maximize independence. Pt is a great rehab candidate with good support. Will follow.    Recommendations for follow up therapy are one component of a multi-disciplinary discharge planning process, led by the attending physician.  Recommendations may be updated based on patient status, additional functional criteria and insurance authorization.  Follow Up Recommendations  Acute inpatient rehab (3hours/day)     Assistance Recommended at Discharge Frequent or constant Supervision/Assistance  Patient can return home with the following A lot of help with bathing/dressing/bathroom;A little help with bathing/dressing/bathroom;Help with stairs or ramp for entrance   Equipment Recommendations  Rolling walker (2 wheels)    Recommendations for Other Services       Precautions / Restrictions Precautions Precautions: Fall Restrictions Weight Bearing Restrictions: No     Mobility  Bed Mobility               General bed mobility comments: patient  received sitting on toilet in bathroom.    Transfers Overall transfer level: Needs assistance Equipment used: Rolling walker (2 wheels) Transfers: Sit to/from Stand;Bed to chair/wheelchair/BSC Sit to Stand: +2 physical assistance;Min assist;Mod assist     Step pivot transfers: Min assist;+2 safety/equipment     General transfer comment: from elevated surface x 3, cues for hand placement, use of momentum, progressed from mod to min +2 for safety. Transferred to chair with use of RW and assist for balance/RW management,.    Ambulation/Gait Ambulation/Gait assistance: Min assist;+2 safety/equipment Gait Distance (Feet): 15 Feet Assistive device: Rolling walker (2 wheels) Gait Pattern/deviations: Step-through pattern;Knee hyperextension - left;Wide base of support;Trunk flexed Gait velocity: decreased     General Gait Details: Slow, unsteady gait with bil knee instability (left>right) with left knee hyperextensiond uring stance phase, heavy reliance on UEs, looking down due to no sensation in LEs/feet.   Stairs             Wheelchair Mobility    Modified Rankin (Stroke Patients Only)       Balance Overall balance assessment: Needs assistance Sitting-balance support: Feet supported Sitting balance-Leahy Scale: Good     Standing balance support: During functional activity Standing balance-Leahy Scale: Poor Standing balance comment: unable to release walker in static standing                            Cognition Arousal/Alertness: Awake/alert Behavior During Therapy: WFL for tasks assessed/performed Overall Cognitive Status: Within Functional Limits for tasks assessed  Exercises      General Comments General comments (skin integrity, edema, etc.): Son present during session.      Pertinent Vitals/Pain Pain Assessment: Faces Faces Pain Scale: Hurts a little bit Pain Location: neck,  back-chronic Pain Descriptors / Indicators: Discomfort;Sore Pain Intervention(s): Monitored during session;Repositioned    Home Living Family/patient expects to be discharged to:: Private residence Living Arrangements: Spouse/significant other Available Help at Discharge: Family;Available 24 hours/day Type of Home: House Home Access: Level entry;Stairs to enter (ground level at back)     Alternate Level Stairs-Number of Steps: uses outdoor elevator to get to second level of house Home Layout: Two level Home Equipment: Engineer, maintenance (IT) (2 wheels);Cane - single point      Prior Function            PT Goals (current goals can now be found in the care plan section) Progress towards PT goals: Progressing toward goals    Frequency    Min 3X/week      PT Plan Current plan remains appropriate;Frequency needs to be updated    Co-evaluation PT/OT/SLP Co-Evaluation/Treatment: Yes Reason for Co-Treatment: For patient/therapist safety   OT goals addressed during session: ADL's and self-care      AM-PAC PT "6 Clicks" Mobility   Outcome Measure  Help needed turning from your back to your side while in a flat bed without using bedrails?: A Lot Help needed moving from lying on your back to sitting on the side of a flat bed without using bedrails?: A Lot Help needed moving to and from a bed to a chair (including a wheelchair)?: A Lot Help needed standing up from a chair using your arms (e.g., wheelchair or bedside chair)?: A Lot Help needed to walk in hospital room?: A Little Help needed climbing 3-5 steps with a railing? : Total 6 Click Score: 12    End of Session   Activity Tolerance: Patient tolerated treatment well Patient left: in chair;with call bell/phone within reach;with family/visitor present;Other (comment) (with MD present) Nurse Communication: Mobility status PT Visit Diagnosis: Muscle weakness (generalized) (M62.81);Other abnormalities of gait and  mobility (R26.89);Unsteadiness on feet (R26.81);Difficulty in walking, not elsewhere classified (R26.2)     Time: 1001-1035 PT Time Calculation (min) (ACUTE ONLY): 34 min  Charges:  $Therapeutic Activity: 8-22 mins                     Marisa Severin, PT, DPT Acute Rehabilitation Services Pager [phone] Office [phone]      Marguarite Arbour A Sabra Heck 04/16/2021, 12:06 PM

## 2021-04-16 NOTE — Progress Notes (Signed)
RT Note:  Patient performed NIF and VC with good effort.   VC 3.2L NIF >-40

## 2021-04-16 NOTE — Progress Notes (Addendum)
Neurology Progress Note  S: Feels like his strength is better. Has stood and walked to bathroom. Last IVIG today. Has worked with PT who recommend CIR. His complaint today is random jerking episodes of extremities which seem to occur on the left side, but sometimes on the right side. His son had a video for NP to see. 2 short episodes of LUE jerking and another one of LLE as well. Patient said when he walked yesterday, these episodes did not occur. He says he took some Fentanyl because "he just couldn't take it". Discussed pain medication is not the treatment for jerking, but Valium would be better option for spasms. NP spoke to patient and son, [name], that NP did not feel this was any type of seizure activity due to the non rhythmical, low - medium amplitude, occurring at random unassociated with post ictal period. NP discussed that this was likely his nerves "waking up" with the treatment for the GBS.   O: Current vital signs: BP 136/81 (BP Location: Left Arm)    Pulse 66    Temp 97.6 F (36.4 C) (Oral)    Resp 16    Ht 5\' 10"  (1.778 m)    Wt (!) 139 kg    SpO2 98%    BMI 43.97 kg/m  Vital signs in last 24 hours: Temp:  [97.6 F (36.4 C)-98.5 F (36.9 C)] 97.6 F (36.4 C) (01/16 0736) Pulse Rate:  [66-70] 66 (01/16 0736) Resp:  [16-18] 16 (01/16 0736) BP: (135-144)/(71-81) 136/81 (01/16 0736) SpO2:  [96 %-98 %] 98 % (01/16 0736)  GENERAL: Fairly well appearing male. Awake, alert in NAD. HEENT: Normocephalic and atraumatic. LUNGS: Normal respiratory effort but he takes deep breaths more, like sighing, but not respiratory distress.   CV: RRR on tele.  Ext: warm.  Neuro:  Mental Status: Alert and oriented. Follows commands.   Speech/Language: speech is without aphasia or dysarthria.  Motor: His strength has improved greatly over NPs last exam last week. BUEx 5/5 strength. RLE 4/5 thigh, 5/5 knee/plantar/dorsiflexion. LLE thigh 4/5, 5/5 knee, dorsi/plantar flexion.  Sensory: His sensation  to light touch is symmetrical to his BLEs from thigh to toes.  DTR: 2+ BUEs, 1+ patella.  Cerebellar-NP noted one jerk of the right hand during exam. No asterixis. No drift.    Medications  Current Facility-Administered Medications:    acetaminophen (TYLENOL) tablet 650 mg, 650 mg, Oral, Q6H PRN, 650 mg at 04/11/21 2345 **OR** acetaminophen (TYLENOL) suppository 650 mg, 650 mg, Rectal, Q6H PRN, Howerter, Justin B, DO   acidophilus (RISAQUAD) capsule 1 capsule, 1 capsule, Oral, Daily, Vann, Jessica U, DO, 1 capsule at 04/16/21 1042   B-complex with vitamin C tablet 1 tablet, 1 tablet, Oral, BID, Vann, Jessica U, DO, 1 tablet at 04/16/21 1042   diazepam (VALIUM) injection 10 mg, 10 mg, Intravenous, Q6H PRN, Vann, Jessica U, DO, 10 mg at 04/15/21 1537   fentaNYL (SUBLIMAZE) injection 50 mcg, 50 mcg, Intravenous, Q2H PRN, Howerter, Justin B, DO, 50 mcg at [id number]   folic acid (FOLVITE) tablet 1 mg, 1 mg, Oral, Daily, Vann, Jessica U, DO   gabapentin (NEURONTIN) capsule 300 mg, 300 mg, Oral, QHS, Howerter, Justin B, DO, 300 mg at 04/15/21 2129   Immune Globulin 10% (PRIVIGEN) IV infusion 55 g, 400 mg/kg, Intravenous, Q24 Hr x 5, Kirby-Graham, Karsten Fells, NP, Stopped at 04/16/21 0351   loperamide (IMODIUM) capsule 2 mg, 2 mg, Oral, PRN, Eliseo Squires, Jessica U, DO, 2 mg at 04/16/21 1042  naloxone [facility]) injection 0.4 mg, 0.4 mg, Intravenous, PRN, Howerter, Justin B, DO   pramipexole (MIRAPEX) tablet 1 mg, 1 mg, Oral, BID, Vann, Jessica U, DO, 1 mg at 04/15/21 1855   predniSONE (DELTASONE) tablet 2.5 mg, 2.5 mg, Oral, Daily, Vann, Jessica U, DO, 2.5 mg at 04/16/21 1042   tamsulosin (FLOMAX) capsule 0.4 mg, 0.4 mg, Oral, QHS, Howerter, Justin B, DO, 0.4 mg at 04/15/21 2128   Vibegron TABS 75 mg, 75 mg, Oral, Daily, Vann, Jessica U, DO, 75 mg at 04/16/21 1042  No New Imaging.  Assessment: 76 yo male who presented 5 days ago with acute (1-2) day onset of leg weakness, worse than normal) and numbness and  tingling over more than his normal neuropathy symptoms. He has a lot of spinal issues in past with history of lumbar surgery. His symptoms progressed in an ascending pattern. He was areflexic and suspicion was for GBS. His LP resulted with no WBC but high protein, consistent with GBS. He was started on IVIG and today is last dose.Today's exam is much better than before IVIG was begun. His progress of exam today is good, but is skewed given his jerking and his fatigue with lack of effort. NP does not believe his complaints are seizure like activity. We will try and increase the Mirapex to see if that helps.   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   Recommendations/Plan:  -Increase Premipexole to 1mg  at 1600 hours and 1.5mg  at 1900 hours-Ii put order in.  -Try decreasing the Fentanyl.  -Continue the Valium and patient educated that Valium is better for spasm like activity than narcotics.  -Agree with PT recommendation for CIR.  -Today is 5/5 IVIG treatments.  -Continue B12 supplementation on discharge.  -Continue to monitor respiratory status but his NIF/VC are with good effort, NIF-40, and 2.26L.  -f/up Copper and Vitamin E levels.  Neurology will be available prn for questions.   Pt seen by Clance Boll, MSN, APN-BC/Nurse Practitioner/Neuro and later by MD. Note and plan to be edited as needed by MD.  Pager: [phone]  NEUROHOSPITALIST ADDENDUM Performed a face to face diagnostic evaluation.   I have reviewed the contents of history and physical exam as documented by PA/ARNP/Resident and agree with above documentation.  I have discussed and formulated the above plan as documented. Edits to the note have been made as needed.  Impression/Key exam findings/Plan: BL lower ext weakness improving with roughly 4+/5 in BL upper extremities and 3-4/5 in BL lower extremities. Feels numb below his knees but gets twitching and  paresthesias. His symptoms continue to improve. If he has worsening neuropathic pain, can try increasing his gabapentin or add topical lidocaine. He would be an excellent candidate for inpatient rehab. We will signoff. Please feel free to contact us with any questions and concerns.  Donnetta Simpers, MD [facility] [phone]   If 7pm to 7am, please call on call as listed on AMION.

## 2021-04-16 NOTE — Evaluation (Signed)
Occupational Therapy Evaluation Patient Details Name: Tony Mcintosh MRN: [id number] DOB: May 07, 1945 Today's Date: 04/16/2021   History of Present Illness Tony Mcintosh is a 76 y.o. male with medical history significant for lumbar spinal stenosis, peripheral polyneuropathy, chronic back pain, restless leg syndrome, BPH, who is [facility] on 04/11/2021 for further evaluation and management of presenting numbness/weakness involving the bilateral lower extremities. 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   Clinical Impression   Pt walked and stood for short bouts and was independent in self care prior to admission. He used a scooter for longer distances. He likes to work in his [employer]. Pt presents with generalized weakness (proximal UE weakness greater than distal), decreased activity tolerance and heavy reliance on UEs for standing and ambulation. He needs set up to total assist for ADL. Pt has excellent potential to return to modified independence with intensive rehab. Will follow acutely.      Recommendations for follow up therapy are one component of a multi-disciplinary discharge planning process, led by the attending physician.  Recommendations may be updated based on patient status, additional functional criteria and insurance authorization.   Follow Up Recommendations  Acute inpatient rehab (3hours/day)    Assistance Recommended at Discharge Frequent or constant Supervision/Assistance  Patient can return home with the following A little help with walking and/or transfers;A lot of help with bathing/dressing/bathroom;Assist for transportation;Help with stairs or ramp for entrance    Functional Status Assessment  Patient has had a recent decline in their functional status and demonstrates the ability to make significant improvements in function in a reasonable and predictable amount of time.  Equipment Recommendations  BSC/3in1    Recommendations for Other Services        Precautions / Restrictions Precautions Precautions: Fall      Mobility Bed Mobility                    Transfers Overall transfer level: Needs assistance Equipment used: Rolling walker (2 wheels) Transfers: Sit to/from Stand Sit to Stand: +2 physical assistance;Mod assist;Min assist           General transfer comment: from elevated surface x 3, cues for hand placement, use of momentum, progressed from mod to min +2 for safety      Balance Overall balance assessment: Needs assistance Sitting-balance support: Feet supported Sitting balance-Leahy Scale: Good     Standing balance support: Bilateral upper extremity supported;During functional activity;Reliant on assistive device for balance Standing balance-Leahy Scale: Poor Standing balance comment: unable to release walker in static standing                           ADL either performed or assessed with clinical judgement   ADL Overall ADL's : Needs assistance/impaired Eating/Feeding: Set up;Sitting   Grooming: Set up;Sitting   Upper Body Bathing: Minimal assistance;Sitting   Lower Body Bathing: Total assistance;+2 for safety/equipment;Sit to/from stand   Upper Body Dressing : Set up;Sitting   Lower Body Dressing: Total assistance;Sit to/from stand   Toilet Transfer: +2 for safety/equipment;Moderate assistance;Ambulation;Rolling walker (2 wheels)   Toileting- Clothing Manipulation and Hygiene: Total assistance;Sit to/from stand       Functional mobility during ADLs: +2 for safety/equipment;Minimal assistance       Vision Baseline Vision/History: 1 Wears glasses Ability to See in Adequate Light: 0 Adequate Patient Visual Report: No change from baseline       Perception     Praxis  Pertinent Vitals/Pain Pain Assessment: Faces Faces Pain Scale: Hurts a little bit Pain Location: neck, back-chronic Pain Descriptors / Indicators: Discomfort;Sore Pain Intervention(s): Monitored  during session;Repositioned     Hand Dominance Right   Extremity/Trunk Assessment Upper Extremity Assessment Upper Extremity Assessment: RUE deficits/detail;LUE deficits/detail RUE Deficits / Details: shoulder 3+/5, elbow 4/5, hand 5/5 LUE Deficits / Details: shoulder 3/5, elbow 3+5, hand 4/5       Cervical / Trunk Assessment Cervical / Trunk Assessment: Other exceptions (hx of back and cervical surgery, obesity)   Communication Communication Communication: No difficulties   Cognition Arousal/Alertness: Awake/alert Behavior During Therapy: WFL for tasks assessed/performed Overall Cognitive Status: Within Functional Limits for tasks assessed                                       General Comments       Exercises     Shoulder Instructions      Home Living Family/patient expects to be discharged to:: Private residence Living Arrangements: Spouse/significant other Available Help at Discharge: Family;Available 24 hours/day Type of Home: House Home Access: Level entry;Stairs to enter (ground level at back)     Home Layout: Two level Alternate Level Stairs-Number of Steps: uses outdoor elevator to get to second level of house   Bathroom Shower/Tub: Tub/shower unit;Walk-in shower   Bathroom Toilet: Standard   How Accessible: Accessible via walker Home Equipment: Insurance risk surveyor (2 wheels);Gilmer Mor - single point      Lives With: Spouse    Prior Functioning/Environment Prior Level of Function : Independent/Modified Independent             Mobility Comments: patient is limited ambulator at baseline. Only able to walk/stand for 5 minutes at a time. Uses scooter for longer distances. ADLs Comments: Has wife to assist as needed, son lives in [place].        OT Problem List: Decreased strength;Decreased activity tolerance;Impaired balance (sitting and/or standing);Decreased knowledge of use of DME or AE;Obesity      OT  Treatment/Interventions: Self-care/ADL training;DME and/or AE instruction;Therapeutic activities;Patient/family education;Balance training    OT Goals(Current goals can be found in the care plan section) Acute Rehab OT Goals OT Goal Formulation: With patient Time For Goal Achievement: 04/30/21 Potential to Achieve Goals: Good ADL Goals Pt Will Perform Grooming: standing;with min assist (one activity) Pt Will Perform Upper Body Bathing: with set-up;sitting;with adaptive equipment Pt Will Perform Lower Body Dressing: with min assist;with adaptive equipment;sitting/lateral leans;sit to/from stand Pt Will Transfer to Toilet: with supervision;ambulating;bedside commode (over toilet) Pt Will Perform Toileting - Clothing Manipulation and hygiene: with min assist;sit to/from stand;sitting/lateral leans Pt/caregiver will Perform Home Exercise Program: Increased strength;Both right and left upper extremity;Independently;With written HEP provided Additional ADL Goal #1: Pt will stand with stability of one UE in preparation for ADL.  OT Frequency: Min 2X/week    Co-evaluation PT/OT/SLP Co-Evaluation/Treatment: Yes Reason for Co-Treatment: For patient/therapist safety   OT goals addressed during session: ADL's and self-care      AM-PAC OT "6 Clicks" Daily Activity     Outcome Measure Help from another person eating meals?: None Help from another person taking care of personal grooming?: A Little Help from another person toileting, which includes using toliet, bedpan, or urinal?: Total Help from another person bathing (including washing, rinsing, drying)?: A Lot Help from another person to put on and taking off regular upper body clothing?: A Little Help  from another person to put on and taking off regular lower body clothing?: Total 6 Click Score: 14   End of Session Equipment Utilized During Treatment: Gait belt;Rolling walker (2 wheels) Nurse Communication: Mobility status  Activity  Tolerance: Patient tolerated treatment well Patient left: in chair;with family/visitor present;with call bell/phone within reach  OT Visit Diagnosis: Unsteadiness on feet (R26.81);Other abnormalities of gait and mobility (R26.89);Muscle weakness (generalized) (M62.81)                Time: 1478-2956 OT Time Calculation (min): 33 min Charges:  OT General Charges $OT Visit: 1 Visit OT Evaluation $OT Eval Moderate Complexity: 1 Mod  Martie Round, OTR/L Acute Rehabilitation Services Pager: [phone] Office: [phone]   Evern Bio 04/16/2021, 11:27 AM

## 2021-04-17 ENCOUNTER — Encounter (HOSPITAL_COMMUNITY): Payer: Self-pay | Admitting: Internal Medicine

## 2021-04-17 ENCOUNTER — Inpatient Hospital Stay (HOSPITAL_COMMUNITY): Payer: No Typology Code available for payment source

## 2021-04-17 DIAGNOSIS — I739 Peripheral vascular disease, unspecified: Secondary | ICD-10-CM

## 2021-04-17 LAB — CBC
HCT: 40.9 % (ref 39.0–52.0)
Hemoglobin: 14.4 g/dL (ref 13.0–17.0)
MCH: 32.9 pg (ref 26.0–34.0)
MCHC: 35.2 g/dL (ref 30.0–36.0)
MCV: 93.4 fL (ref 80.0–100.0)
Platelets: 143 10*3/uL — ABNORMAL LOW (ref 150–400)
RBC: 4.38 MIL/uL (ref 4.22–5.81)
RDW: 13.8 % (ref 11.5–15.5)
WBC: 5 10*3/uL (ref 4.0–10.5)
nRBC: 0 % (ref 0.0–0.2)

## 2021-04-17 LAB — COPPER, SERUM: Copper: 105 ug/dL (ref 69–132)

## 2021-04-17 LAB — BASIC METABOLIC PANEL
Anion gap: 6 (ref 5–15)
BUN: 16 mg/dL (ref 8–23)
CO2: 26 mmol/L (ref 22–32)
Calcium: 8.8 mg/dL — ABNORMAL LOW (ref 8.9–10.3)
Chloride: 100 mmol/L (ref 98–111)
Creatinine, Ser: 0.95 mg/dL (ref 0.61–1.24)
GFR, Estimated: >60 mL/min (ref >60)
Glucose, Bld: 104 mg/dL — ABNORMAL HIGH (ref 70–99)
Potassium: 3.8 mmol/L (ref 3.5–5.1)
Sodium: 132 mmol/L — ABNORMAL LOW (ref 135–145)

## 2021-04-17 LAB — OLIGOCLONAL BANDS, CSF + SERM

## 2021-04-17 MED ORDER — DOCUSATE SODIUM 100 MG PO CAPS
100.0000 mg | ORAL_CAPSULE | Freq: Every day | ORAL | Status: DC | PRN
  Administered 2021-04-18: 100 mg via ORAL
  Filled 2021-04-17: qty 1

## 2021-04-17 MED ORDER — PRAMIPEXOLE DIHYDROCHLORIDE 0.125 MG PO TABS
0.5000 mg | ORAL_TABLET | Freq: Once | ORAL | Status: AC
  Administered 2021-04-17: 0.5 mg via ORAL
  Filled 2021-04-17: qty 4

## 2021-04-17 MED ORDER — PRAMIPEXOLE DIHYDROCHLORIDE 1.5 MG PO TABS
1.5000 mg | ORAL_TABLET | Freq: Every day | ORAL | Status: DC
  Administered 2021-04-18: 1.5 mg via ORAL
  Filled 2021-04-17 (×3): qty 1

## 2021-04-17 MED ORDER — DIAZEPAM 5 MG/ML IJ SOLN
5.0000 mg | Freq: Four times a day (QID) | INTRAMUSCULAR | Status: DC | PRN
  Administered 2021-04-18 – 2021-04-20 (×3): 10 mg via INTRAVENOUS
  Filled 2021-04-17 (×3): qty 2

## 2021-04-17 NOTE — Progress Notes (Signed)
Progress Note    AEDDON Mcintosh  SWH:[id number] DOB: 1946-01-08  DOA: 04/11/2021 [facility]CP: [facility]    Brief Narrative:     Medical records reviewed and are as summarized below:  Tony Mcintosh is an 76 y.o. male with medical history significant for lumbar spinal stenosis, peripheral polyneuropathy, chronic back pain, restless leg syndrome, BPH, who is admitted to [facility] on 04/11/2021 for further evaluation and management of presenting numbness/weakness involving the bilateral lower extremities. s/p LP.  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  S/p 5 days of IVIG.  CIR recommended by PT.  Await insurance authorization.     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 -IVIG x 5 days -PT recommends CIR -check ABI- ordered 1/14 (symptoms sounds more like neuropathy though)   Restless leg syndrome, -on scheduled Mirapex as an outpatient- dose increased   Benign Prostatic Hyperplasia:   -on tamsulosin/vibegron as outpatient.    Obstructive sleep apnea:  -nocturnal CPAP.  obesity Body mass index is 44.41 kg/m.  Arthritis- -was started on prednisone and methotrexate -hold methotrexate for now -wean down prednisone to lowest dose where he  still has relief -follows with Dr. Octaviano Glow in [place]  Diarrhea -no further episodes -GI pathogen panel ordered but never collected -now asking for a stool softener   Family Communication/Anticipated D/C date and plan/Code Status   DVT prophylaxis: Lovenox ordered. Code Status: Full Code.  Disposition Plan: Status is: inpt  The patient will require care spanning > 2 midnights and should be moved to inpatient because: further work up, CIR placement        Medical Consultants:   NS neurology   Subjective:   Waiting on rehab placement Multiple questions answered  Objective:    Vitals:   04/16/21 1704 04/16/21 2040 04/17/21 0320 04/17/21 0754  BP: 131/82 (!) 149/86 (!) 154/84 (!) 142/79  Pulse: 64 65 63 68  Resp: 18 19 19 18   Temp: 97.8 F (36.6 C) 98 F (36.7 C) 97.7 F (36.5 C) 97.9 F (36.6 C)  TempSrc: Oral Oral Oral Oral  SpO2: 96% 94% 98% 97%  Weight:   (!) 140.4 kg   Height:        Intake/Output Summary (Last 24 hours) at 04/17/2021 1210 Last data filed at 04/17/2021 0827 Gross per 24 hour  Intake 360 ml  Output 700 ml  Net -340 ml   Filed Weights   04/13/21 0547 04/14/21 0500 04/17/21 0320  Weight: (!) 138.1 kg (!) 139 kg (!) 140.4 kg    Exam:   General: Appearance:    Severely obese male in no acute distress- sitting on side of bed     Lungs:     respirations unlabored  Heart:    Normal heart rate.    MS:   All  extremities are intact.  Feet cool to touch   Neurologic:   Awake, alert, oriented x 3.             Data Reviewed:   I have personally reviewed following labs and imaging studies:  Labs: Labs show the following:   Basic Metabolic Panel: Recent Labs  Lab 04/11/21 0905 04/11/21 0943 04/12/21 0212 04/17/21 0553  NA 133* 137 136 132*  K 3.7 3.8 4.2 3.8  CL 97* 101 101 100  CO2 25  --  23 26  GLUCOSE 98 97 153* 104*  BUN CREATININE 0.99 0.90 0.90 0.95  CALCIUM 9.2  --  8.9 8.8*  MG  --   --  2.0   --    GFR Estimated Creatinine Clearance: 92.1 mL/min (by C-G formula based on SCr of 0.95 mg/dL). Liver Function Tests: Recent Labs  Lab 04/11/21 0905 04/12/21 0212 04/12/21 1138  AST 29 29  --   ALT 34 30  --   ALKPHOS 67 59  --   BILITOT 0.9 1.1  --   PROT 7.5 6.7  --   ALBUMIN 4.2 3.6 4.1   No results for input(s): LIPASE, AMYLASE in the last 168 hours. No results for input(s): AMMONIA in the last 168 hours. Coagulation profile Recent Labs  Lab 04/11/21 0905  INR 0.9    CBC: Recent Labs  Lab 04/11/21 0905 04/11/21 0943 04/12/21 0212 04/17/21 0553  WBC 6.3  --  6.8 5.0  NEUTROABS 4.1  --  6.1  --   HGB 14.8 16.0 14.5 14.4  HCT 45.1 47.0 43.2 40.9  MCV 96.6  --  97.3 93.4  PLT 208  --  199 143*   Cardiac Enzymes: No results for input(s): CKTOTAL, CKMB, CKMBINDEX, TROPONINI in the last 168 hours. BNP (last 3 results) No results for input(s): PROBNP in the last 8760 hours. CBG: Recent Labs  Lab 04/11/21 1114  GLUCAP 104*   D-Dimer: No results for input(s): DDIMER in the last 72 hours. Hgb A1c: No results for input(s): HGBA1C in the last 72 hours.  Lipid Profile: No results for input(s): CHOL, HDL, LDLCALC, TRIG, CHOLHDL, LDLDIRECT in the last 72 hours. Thyroid function studies: No results for input(s): TSH, T4TOTAL, T3FREE, THYROIDAB in the last 72 hours.  Invalid input(s): FREET3  Anemia work up: Recent Labs    04/15/21 0130  FERRITIN 344*  TIBC 305  IRON 46   Sepsis Labs: Recent Labs  Lab 04/11/21 0905 04/12/21 0212 04/17/21 0553  WBC 6.3 6.8 5.0    Microbiology Recent Results (from the past 240 hour(s))  Resp Panel by RT-PCR (Flu A&B, Covid) Nasopharyngeal Swab     Status: None   Collection Time: 04/11/21  8:04 PM   Specimen: Nasopharyngeal Swab; Nasopharyngeal(NP) swabs in vial transport medium  Result Value Ref Range Status   SARS Coronavirus 2 by RT PCR NEGATIVE NEGATIVE Final    Comment: (NOTE) SARS-CoV-2 target nucleic  acids are NOT DETECTED.  The SARS-CoV-2 RNA is generally detectable in upper respiratory specimens during the acute phase of infection. The lowest concentration of SARS-CoV-2 viral copies this assay can detect is 138 copies/mL. A negative result does not preclude SARS-Cov-2 infection and should not be used as the sole basis for treatment or other patient management decisions. A negative result may occur with  improper specimen collection/handling, submission of specimen other than nasopharyngeal swab, presence of viral mutation(s) within the areas targeted by this assay, and  inadequate number of viral copies(<138 copies/mL). A negative result must be combined with clinical observations, patient history, and epidemiological information. The expected result is Negative.  Fact Sheet for Patients:  BloggerCourse.comhttps://www.fda.gov/media/152166/download  Fact Sheet for Healthcare Providers:  SeriousBroker.ithttps://www.fda.gov/media/152162/download  This test is no t yet approved or cleared by the [place] FDA and  has been authorized for detection and/or diagnosis of SARS-CoV-2 by FDA under an Emergency Use Authorization (EUA). This EUA will remain  in effect (meaning this test can be used) for the duration of the COVID-19 declaration under Section 564(b)(1) of the Act, 21 U.S.C.section 360bbb-3(b)(1), unless the authorization is terminated  or revoked sooner.       Influenza A by PCR NEGATIVE NEGATIVE Final   Influenza B by PCR NEGATIVE NEGATIVE Final    Comment: (NOTE) The Xpert Xpress SARS-CoV-2/FLU/RSV plus assay is intended as an aid in the diagnosis of influenza from Nasopharyngeal swab specimens and should not be used as a sole basis for treatment. Nasal washings and aspirates are unacceptable for Xpert Xpress SARS-CoV-2/FLU/RSV testing.  Fact Sheet for Patients: BloggerCourse.comhttps://www.fda.gov/media/152166/download  Fact Sheet for Healthcare Providers: SeriousBroker.ithttps://www.fda.gov/media/152162/download  This  test is not yet approved or cleared by the [place] FDA and has been authorized for detection and/or diagnosis of SARS-CoV-2 by FDA under an Emergency Use Authorization (EUA). This EUA will remain in effect (meaning this test can be used) for the duration of the COVID-19 declaration under Section 564(b)(1) of the Act, 21 U.S.C. section 360bbb-3(b)(1), unless the authorization is terminated or revoked.  Performed at [facility], [address] [place], [place] [address]     Procedures and diagnostic studies:  No results found.  Medications:    acidophilus  1 capsule Oral Daily   B-complex with vitamin C  1 tablet Oral BID   folic acid  1 mg Oral Daily   gabapentin  300 mg Oral QHS   pramipexole  1 mg Oral q1600   pramipexole  1.5 mg Oral Daily   predniSONE  2.5 mg Oral Daily   tamsulosin  0.4 mg Oral QHS   Vibegron  75 mg Oral Daily   Continuous Infusions:     LOS: 5 days   Joseph ArtJessica U Mikaia Janvier  [facility]   How to contact the [facility] Attending or Consulting provider 7A - 7P or covering provider during after hours 7P -7A, for this patient?  Check the care team in [facility] and look for a) attending/consulting [facility] provider listed and b) the [facility] team listed Log into [email or web address] and use [place]'s universal password to access. If you do not have the password, please contact the hospital operator. Locate the [facility] provider you are looking for under [facility] and page to a number that you can be directly reached. If you still have difficulty reaching the provider, please page the [facility] (Director on Call) for the Hospitalists listed on amion for assistance.  04/17/2021, 12:10 PM

## 2021-04-17 NOTE — Progress Notes (Signed)
ABI has been completed.   Preliminary results in CV Proc.   Aundra Millet Letizia Hook 04/17/2021 1:48 PM

## 2021-04-17 NOTE — Progress Notes (Signed)
Patient has home CPAP at bedside. No assistance needed from RT at this time.  ?

## 2021-04-17 NOTE — Progress Notes (Signed)
RT NOTES: Pt performed NIF and VC with great effort. Nif: Well above >-40 VC 2.35L
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
## 2021-04-17 NOTE — Progress Notes (Signed)
RT Note:  Patient displayed great effort on NIF/VC  NIF >-40 VC 3.92L

## 2021-04-17 NOTE — Progress Notes (Signed)
Inpatient Rehab Admissions Coordinator:   I do not have insurance auth or a CIR bed for this Pt. Today. I will continue to follow for potential admit pending insurance auth  Megan Salon, MS, CCC-SLP Rehab Admissions Coordinator  [phone] (celll) [phone] (office)

## 2021-04-18 LAB — GASTROINTESTINAL PANEL BY PCR, STOOL (REPLACES STOOL CULTURE)

## 2021-04-18 MED ORDER — BISACODYL 10 MG RE SUPP
10.0000 mg | Freq: Every day | RECTAL | Status: DC | PRN
  Administered 2021-04-18: 10 mg via RECTAL
  Filled 2021-04-18: qty 1

## 2021-04-18 NOTE — Progress Notes (Signed)
Inpatient Rehab Admissions Coordinator:  ° °I do not yet have insurance auth or a CIR bed for this Pt. Today. I will continue to follow for potential admit  pending insurance auth. ° °Dezi Schaner, MS, CCC-SLP °Rehab Admissions Coordinator  °[phone] (celll) °[phone] (office) ° °

## 2021-04-18 NOTE — Plan of Care (Signed)
Pt alert and oriented x 4. Son at bedside and assist pt with care. Pt uses walker to stand and ambulate. Pt reports decreased sensation in legs. Pt received 1 dose of fentanyl this shift and 1 dose of valium. Following valium bedside monitor placed to monitor oxygen levels and pulse. Vitals stable.  Problem: Education: Goal: Knowledge of General Education information will improve Description: Including pain rating scale, medication(s)/side effects and non-pharmacologic comfort measures Outcome: Progressing   Problem: Health Behavior/Discharge Planning: Goal: Ability to manage health-related needs will improve Outcome: Progressing   Problem: Clinical Measurements: Goal: Ability to maintain clinical measurements within normal limits will improve Outcome: Progressing Goal: Will remain free from infection Outcome: Progressing Goal: Diagnostic test results will improve Outcome: Progressing Goal: Respiratory complications will improve Outcome: Progressing Goal: Cardiovascular complication will be avoided Outcome: Progressing   Problem: Activity: Goal: Risk for activity intolerance will decrease Outcome: Progressing   Problem: Nutrition: Goal: Adequate nutrition will be maintained Outcome: Progressing   Problem: Coping: Goal: Level of anxiety will decrease Outcome: Progressing   Problem: Elimination: Goal: Will not experience complications related to bowel motility Outcome: Progressing Goal: Will not experience complications related to urinary retention Outcome: Progressing   Problem: Pain Managment: Goal: General experience of comfort will improve Outcome: Progressing   Problem: Safety: Goal: Ability to remain free from injury will improve Outcome: Progressing   Problem: Skin Integrity: Goal: Risk for impaired skin integrity will decrease Outcome: Progressing

## 2021-04-18 NOTE — Care Management Important Message (Signed)
Important Message  Patient Details  Name: Tony Mcintosh MRN: [id number] Date of Birth: Mar 01, 1946   Medicare Important Message Given:  Yes     Hannah Beat 04/18/2021, 11:59 AM

## 2021-04-18 NOTE — Progress Notes (Signed)
Patient has home CPAP at bedside. No assistance needed from RT.

## 2021-04-18 NOTE — Progress Notes (Signed)
Occupational Therapy Treatment Patient Details Name: Tony Mcintosh Mecham MRN: [id number] DOB: 1946-02-01 Today's Date: 04/18/2021   History of present illness Tony Mcintosh Burnley is a 76 y.o. male with medical history significant for lumbar spinal stenosis, peripheral polyneuropathy, chronic back pain, restless leg syndrome, BPH, who [facility]2021 for further evaluation and management of presenting numbness/weakness involving the bilateral lower extremities. 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   OT comments  Pt with excellent gains in UB strength vs last OT visit, L UE remains slightly weaker and pt fatigues easily. Focus of session on sit to stand with RW and stedy. Pt unable to take steps today due to L knee buckling. Pt continues to put forth excellent effort and is an ideal inpatient rehab candidate.    Recommendations for follow up therapy are one component of a multi-disciplinary discharge planning process, led by the attending physician.  Recommendations may be updated based on patient status, additional functional criteria and insurance authorization.    Follow Up Recommendations  Acute inpatient rehab (3hours/day)    Assistance Recommended at Discharge Frequent or constant Supervision/Assistance  Patient can return home with the following  Two people to help with walking and/or transfers;Two people to help with bathing/dressing/bathroom;Assistance with cooking/housework;Assist for transportation;Help with stairs or ramp for entrance   Equipment Recommendations  Wheelchair (measurements OT);Wheelchair cushion (measurements OT) (drop commode)    Recommendations for Other Services      Precautions / Restrictions Precautions Precautions: Fall Precaution Comments: L knee buckles Restrictions Weight Bearing Restrictions: No       Mobility Bed Mobility               General bed mobility comments: patient received sitting on side of  bed    Transfers Overall transfer level: Needs assistance Equipment used: Rolling walker (2 wheels), Ambulation equipment used Transfers: Sit to/from Stand Sit to Stand: +2 physical assistance, Mod assist, Min assist           General transfer comment: from elevated surface x 5, cues for hand placement, use of momentum, progressed from max to mod +2 for safety. Pt could not take steps today due to left LE weakness and pain.  He tried multiple attempts with RW and then obtained Stedy and pt worked on sit to stand with Stedy and at least shifting weight side to side and attemptimg to raise the feet off the [place]. Pt very weak today and could not safely transfer. Placed heat to hamstring to see if that helps. Transfer via Lift Equipment: [employer] ExpressStedy   Balance Overall balance assessment: Needs assistance   Sitting balance-Leahy Scale: Good     Standing balance support: During functional activity Standing balance-Leahy Scale: Poor Standing balance comment: unable to release walker or stedy in static standing                           ADL either performed or assessed with clinical judgement   ADL                                              Extremity/Trunk Assessment Upper Extremity Assessment Upper Extremity Assessment: RUE deficits/detail;LUE deficits/detail RUE Deficits / Details: 5/5 all areas LUE Deficits / Details: shoulder 4/5, elbow 4/5, hand 5/5  Vision       Perception     Praxis      Cognition Arousal/Alertness: Awake/alert Behavior During Therapy: WFL for tasks assessed/performed Overall Cognitive Status: Within Functional Limits for tasks assessed                                 General Comments: pt well aware of his limits        Exercises      Shoulder Instructions       General Comments      Pertinent Vitals/ Pain       Pain Assessment Pain Assessment: Faces Faces Pain Scale: Hurts little  more Pain Location: back Pain Descriptors / Indicators: Discomfort, Sore Pain Intervention(s): Monitored during session, Repositioned, Premedicated before session  Home Living                                          Prior Functioning/Environment              Frequency  Min 2X/week        Progress Toward Goals  OT Goals(current goals can now be found in the care plan section)  Progress towards OT goals: Progressing toward goals  Acute Rehab OT Goals OT Goal Formulation: With patient Time For Goal Achievement: 04/30/21 Potential to Achieve Goals: Good  Plan Discharge plan remains appropriate    Co-evaluation    PT/OT/SLP Co-Evaluation/Treatment: Yes Reason for Co-Treatment: Complexity of the patient's impairments (multi-system involvement);For patient/therapist safety PT goals addressed during session: Mobility/safety with mobility OT goals addressed during session: Strengthening/ROM      AM-PAC OT "6 Clicks" Daily Activity     Outcome Measure   Help from another person eating meals?: None Help from another person taking care of personal grooming?: A Little Help from another person toileting, which includes using toliet, bedpan, or urinal?: Total Help from another person bathing (including washing, rinsing, drying)?: A Lot Help from another person to put on and taking off regular upper body clothing?: A Little Help from another person to put on and taking off regular lower body clothing?: Total 6 Click Score: 14    End of Session Equipment Utilized During Treatment: Gait belt;Rolling walker (2 wheels)  OT Visit Diagnosis: Unsteadiness on feet (R26.81);Other abnormalities of gait and mobility (R26.89);Muscle weakness (generalized) (M62.81)   Activity Tolerance Patient tolerated treatment well   Patient Left in bed;with call bell/phone within reach;with family/visitor present   Nurse Communication          Time: 1610-9604 OT Time  Calculation (min): 27 min  Charges: OT General Charges $OT Visit: 1 Visit OT Treatments $Therapeutic Activity: 8-22 mins  Martie Round, OTR/L Acute Rehabilitation Services Pager: [phone] Office: [phone]  Evern Bio 04/18/2021, 3:38 PM

## 2021-04-18 NOTE — Progress Notes (Signed)
Physical Therapy Treatment Patient Details Name: Tony NobleDale R Alverio MRN: [id number] DOB: [birth year] Today's Date: 04/18/2021   History of Present Illness Tony Mcintosh is a 76 y.o. male with medical history significant for lumbar spinal stenosis, peripheral polyneuropathy, chronic back pain, restless leg syndrome, BPH, who is admitted to [facility] on 04/11/2021 for further evaluation and management of presenting numbness/weakness involving the bilateral lower extremities. 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    PT Comments    Pt admitted with above diagnosis. Pt limited today by pain left LE and weakness limiting his ability towalk.  Worked on standing to RW and to [place] working on [employer] shifting. Pt motivated.  Pt currently with functional limitations due to balance and endurance deficits. Pt will benefit from skilled PT to increase their independence and safety with mobility to allow discharge to the venue listed below.      Recommendations for follow up therapy are one component of a multi-disciplinary discharge planning process, led by the attending physician.  Recommendations may be updated based on patient status, additional functional criteria and insurance authorization.  Follow Up Recommendations  Acute inpatient rehab (3hours/day)     Assistance Recommended at Discharge Frequent or constant Supervision/Assistance  Patient can return home with the following A lot of help with bathing/dressing/bathroom;A little help with bathing/dressing/bathroom;Help with stairs or ramp for entrance   Equipment Recommendations  Rolling walker (2 wheels)    Recommendations for Other Services Rehab consult     Precautions / Restrictions Precautions Precautions: Fall Restrictions Weight Bearing Restrictions: No     Mobility  Bed Mobility               General bed mobility comments: patient received sitting on side of bed    Transfers Overall transfer level: Needs  assistance Equipment used: Rolling walker (2 wheels), Ambulation equipment used Transfers: Sit to/from Stand, Bed to chair/wheelchair/BSC Sit to Stand: +2 physical assistance, Mod assist, Max assist, From elevated surface           General transfer comment: from elevated surface x 5, cues for hand placement, use of momentum, progressed from max to mod +2 for safety. Pt could not take steps today due to left LE weakness and pain.  He tried multiple attempts with RW and then obtained Stedy and pt worked on sit to stand with Stedy and at least shifting weight side to side and attemptimg to raise the feet off the [place]. Pt very weak today and could not safely transfer. Placed heat to hamstring to see if that helps. Transfer via Lift Equipment: Stedy  Ambulation/Gait                   Stairs             Wheelchair Mobility    Modified Rankin (Stroke Patients Only)       Balance Overall balance assessment: Needs assistance Sitting-balance support: Feet supported Sitting balance-Leahy Scale: Good     Standing balance support: During functional activity Standing balance-Leahy Scale: Poor Standing balance comment: unable to release walker in static standing.  only able to static stand                            Cognition Arousal/Alertness: Awake/alert Behavior During Therapy: WFL for tasks assessed/performed Overall Cognitive Status: Within Functional Limits for tasks assessed  Exercises General Exercises - Lower Extremity Ankle Circles/Pumps: AROM, Both, 5 reps, Supine Long Arc Quad: AROM, Both, 10 reps, Seated    General Comments        Pertinent Vitals/Pain Pain Assessment Pain Assessment: Faces Faces Pain Scale: Hurts even more Pain Location: neck, back-chronic Pain Descriptors / Indicators: Discomfort, Sore Pain Intervention(s): Limited activity within patient's tolerance, Monitored  during session, Repositioned    Home Living                          Prior Function            PT Goals (current goals can now be found in the care plan section) Acute Rehab PT Goals Patient Stated Goal: to improve Progress towards PT goals: Not progressing toward goals - comment (pain and left LE weakness)    Frequency    Min 3X/week      PT Plan Current plan remains appropriate;Frequency needs to be updated    Co-evaluation PT/OT/SLP Co-Evaluation/Treatment: Yes Reason for Co-Treatment: Complexity of the patient's impairments (multi-system involvement);For patient/therapist safety PT goals addressed during session: Mobility/safety with mobility        AM-PAC PT "6 Clicks" Mobility   Outcome Measure  Help needed turning from your back to your side while in a flat bed without using bedrails?: A Lot Help needed moving from lying on your back to sitting on the side of a flat bed without using bedrails?: A Lot Help needed moving to and from a bed to a chair (including a wheelchair)?: A Lot Help needed standing up from a chair using your arms (e.g., wheelchair or bedside chair)?: Total Help needed to walk in hospital room?: Total Help needed climbing 3-5 steps with a railing? : Total 6 Click Score: 9    End of Session Equipment Utilized During Treatment: Gait belt Activity Tolerance: Patient limited by fatigue;Patient limited by pain Patient left: with call bell/phone within reach;with family/visitor present;in bed Nurse Communication: Mobility status PT Visit Diagnosis: Muscle weakness (generalized) (M62.81);Other abnormalities of gait and mobility (R26.89);Unsteadiness on feet (R26.81);Difficulty in walking, not elsewhere classified (R26.2)     Time: 1610-9604 PT Time Calculation (min) (ACUTE ONLY): 38 min  Charges:  $Therapeutic Exercise: 8-22 mins $Therapeutic Activity: 8-22 mins                     Asiya Cutbirth M,PT Acute Rehab  Services [phone] (pager)    Bevelyn Buckles 04/18/2021, 2:15 PM

## 2021-04-18 NOTE — Progress Notes (Signed)
Progress Note    Tony Mcintosh  MKL:[id number] DOB: 06-10-1945  DOA: 04/11/2021 [facility]CP: [facility]    Brief Narrative:     Medical records reviewed and are as summarized below:  Tony Mcintosh is an 76 y.o. male with medical history significant for lumbar spinal stenosis, peripheral polyneuropathy, chronic back pain, restless leg syndrome, BPH, who is admitted to [facility] on 04/11/2021 for further evaluation and management of presenting numbness/weakness involving the bilateral lower extremities. s/p LP.  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  S/p 5 days of IVIG.  CIR recommended by PT.  Await insurance authorization.     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 -IVIG x 5 days -completed -PT recommends CIR, awaiting insurance authorization and bed placement.  Restless leg syndrome, -on scheduled Mirapex as an outpatient- dose increased   Benign Prostatic Hyperplasia:   -on tamsulosin/vibegron as outpatient.    Obstructive sleep apnea:  -nocturnal CPAP.  Constipation: I offered Dulcolax suppository.  Patient was not sure if he would like that versus p.o. MiraLAX.  Both ordered as needed, left up to patient what ever he  prefers.  obesity Body mass index is 44.41 kg/m.  Arthritis- -was started on prednisone and methotrexate -hold methotrexate for now -wean down prednisone to lowest dose where he still has relief-currently on 2.5 mg daily. -follows with Dr. Octaviano Glow in [place]  Family Communication/Anticipated D/C date and plan/Code Status   DVT prophylaxis: Lovenox ordered. Code Status: Full Code.  Disposition Plan: Status is: inpt  Status is: Inpatient  Remains inpatient appropriate because: Awaiting CIR placement.   Medical Consultants:   NS neurology   Subjective:   Seen and examined.  Son at the bedside.  He complains of some low back pain but that is chronic, he tells me.  Also complains of constipation. Objective:    Vitals:   04/17/21 1623 04/17/21 2149 04/18/21 0326 04/18/21 0728  BP: (!) 142/89 (!) 144/85 137/86 130/62  Pulse: 76 74 68 68  Resp: 19 18 16 16   Temp: 98 F (36.7 C) 98.4 F (36.9 C) (!) 97.3 F (36.3 C) (!) 97.2 F (36.2 C)  TempSrc: Oral Oral Oral Oral  SpO2: 94% 95% 95% 94%  Weight:      Height:       No intake or output data in the 24 hours ending 04/18/21 1357  Filed Weights   04/13/21 0547 04/14/21 0500 04/17/21 0320  Weight: (!) 138.1 kg (!) 139 kg (!) 140.4 kg    Exam: General exam: Appears calm and comfortable  Respiratory system: Clear to auscultation. Respiratory effort normal. Cardiovascular system: S1 & S2 heard, RRR. No JVD, murmurs, rubs, gallops or clicks. No pedal edema. Gastrointestinal system: Abdomen is nondistended, soft and nontender. No organomegaly or masses felt. Normal bowel sounds  heard. Central nervous system: Alert and oriented.  Slightly decreased strength in left lower extremity.  Power normal 5/5 in right lower extremity and bilateral upper extremities. Skin: No rashes, lesions or ulcers.  Psychiatry: Judgement and insight appear normal. Mood & affect appropriate.    Data Reviewed:   I have personally reviewed  following labs and imaging studies:  Labs: Labs show the following:   Basic Metabolic Panel: Recent Labs  Lab 04/12/21 0212 04/17/21 0553  NA 136 132*  K 4.2 3.8  CL 101 100  CO2 23 26  GLUCOSE 153* 104*  BUN 17 16  CREATININE 0.90 0.95  CALCIUM 8.9 8.8*  MG 2.0  --     GFR Estimated Creatinine Clearance: 92.1 mL/min (by C-G formula based on SCr of 0.95 mg/dL). Liver Function Tests: Recent Labs  Lab 04/12/21 0212 04/12/21 1138  AST 29  --   ALT 30  --   ALKPHOS 59  --   BILITOT 1.1  --   PROT 6.7  --   ALBUMIN 3.6 4.1    No results for input(s): LIPASE, AMYLASE in the last 168 hours. No results for input(s): AMMONIA in the last 168 hours. Coagulation profile No results for input(s): INR, PROTIME in the last 168 hours.   CBC: Recent Labs  Lab 04/12/21 0212 04/17/21 0553  WBC 6.8 5.0  NEUTROABS 6.1  --   HGB 14.5 14.4  HCT 43.2 40.9  MCV 97.3 93.4  PLT 199 143*    Cardiac Enzymes: No results for input(s): CKTOTAL, CKMB, CKMBINDEX, TROPONINI in the last 168 hours. BNP (last 3 results) No results for input(s): PROBNP in the last 8760 hours. CBG: No results for input(s): GLUCAP in the last 168 hours.  D-Dimer: No results for input(s): DDIMER in the last 72 hours. Hgb A1c: No results for input(s): HGBA1C in the last 72 hours.  Lipid Profile: No results for input(s): CHOL, HDL, LDLCALC, TRIG, CHOLHDL, LDLDIRECT in the last 72 hours. Thyroid function studies: No results for input(s): TSH, T4TOTAL, T3FREE, THYROIDAB in the last 72 hours.  Invalid input(s): FREET3  Anemia work up: No results for input(s): VITAMINB12, FOLATE, FERRITIN, TIBC, IRON, RETICCTPCT in the last 72 hours.  Sepsis Labs: Recent Labs  Lab 04/12/21 0212 04/17/21 0553  WBC 6.8 5.0     Microbiology Recent Results (from the past 240 hour(s))  Resp Panel by RT-PCR (Flu A&B, Covid) Nasopharyngeal Swab     Status: None   Collection Time: 04/11/21  8:04 PM   Specimen:  Nasopharyngeal Swab; Nasopharyngeal(NP) swabs in vial transport medium  Result Value Ref Range Status   SARS Coronavirus 2 by RT PCR NEGATIVE NEGATIVE Final    Comment: (NOTE) SARS-CoV-2 target nucleic acids are NOT DETECTED.  The SARS-CoV-2 RNA is generally detectable in upper respiratory specimens during the acute phase of infection. The lowest concentration of SARS-CoV-2 viral copies this assay can detect is 138 copies/mL. A negative result does not preclude SARS-Cov-2 infection and should not be used as the sole basis for treatment or other patient management decisions. A negative result may occur with  improper specimen collection/handling, submission of specimen other than nasopharyngeal swab, presence of viral mutation(s) within the areas targeted by this assay, and inadequate number of viral copies(<138 copies/mL). A negative result must be combined with clinical observations, patient history, and epidemiological information. The expected result is Negative.  Fact Sheet for Patients:  BloggerCourse.com  Fact Sheet for Healthcare Providers:  SeriousBroker.it  This test is no  t yet approved or cleared by the [place] and  has been authorized for detection and/or diagnosis of SARS-CoV-2 by FDA under an Emergency Use Authorization (EUA). This EUA will remain  in effect (meaning this test can be used) for the duration of the COVID-19 declaration under Section 564(b)(1) of the Act, 21 U.S.C.section 360bbb-3(b)(1), unless the authorization is terminated  or revoked sooner.       Influenza A by PCR NEGATIVE NEGATIVE Final   Influenza B by PCR NEGATIVE NEGATIVE Final    Comment: (NOTE) The Xpert Xpress SARS-CoV-2/FLU/RSV plus assay is intended as an aid in the diagnosis of influenza from Nasopharyngeal swab specimens and should not be used as a sole basis for treatment. Nasal washings and aspirates are unacceptable for  Xpert Xpress SARS-CoV-2/FLU/RSV testing.  Fact Sheet for Patients: BloggerCourse.com  Fact Sheet for Healthcare Providers: SeriousBroker.it  This test is not yet approved or cleared by the [place] FDA and has been authorized for detection and/or diagnosis of SARS-CoV-2 by FDA under an Emergency Use Authorization (EUA). This EUA will remain in effect (meaning this test can be used) for the duration of the COVID-19 declaration under Section 564(b)(1) of the Act, 21 U.S.C. section 360bbb-3(b)(1), unless the authorization is terminated or revoked.  Performed at [facility], [address], [place], [place] [address]   Gastrointestinal Panel by PCR , Stool     Status: Abnormal   Collection Time: 04/16/21 10:29 AM   Specimen: Stool  Result Value Ref Range Status   Campylobacter species NOT DETECTED NOT DETECTED Final   Plesimonas shigelloides NOT DETECTED NOT DETECTED Final   Salmonella species NOT DETECTED NOT DETECTED Final   Yersinia enterocolitica NOT DETECTED NOT DETECTED Final   Vibrio species NOT DETECTED NOT DETECTED Final   Vibrio cholerae NOT DETECTED NOT DETECTED Final   Enteroaggregative E coli (EAEC) NOT DETECTED NOT DETECTED Final   Enteropathogenic E coli (EPEC) NOT DETECTED NOT DETECTED Final   Enterotoxigenic E coli (ETEC) NOT DETECTED NOT DETECTED Final   Shiga like toxin producing E coli (STEC) NOT DETECTED NOT DETECTED Final   Shigella/Enteroinvasive E coli (EIEC) NOT DETECTED NOT DETECTED Final   Cryptosporidium NOT DETECTED NOT DETECTED Final   Cyclospora cayetanensis NOT DETECTED NOT DETECTED Final   Entamoeba histolytica NOT DETECTED NOT DETECTED Final   Giardia lamblia NOT DETECTED NOT DETECTED Final   Adenovirus F40/41 NOT DETECTED NOT DETECTED Final   Astrovirus NOT DETECTED NOT DETECTED Final   Norovirus GI/GII DETECTED (A) NOT DETECTED Final    Comment: RESULT CALLED TO, READ BACK BY AND  VERIFIED WITH: Lavonna Rua RN [id number] 04/18/21 HNM    Rotavirus A NOT DETECTED NOT DETECTED Final   Sapovirus (I, II, IV, and V) NOT DETECTED NOT DETECTED Final    Comment: Performed at [facility], [address] [place], [place] [address]    Procedures and diagnostic studies:  VAS [place] ABI WITH/WO TBI  Result Date: 04/17/2021  LOWER EXTREMITY DOPPLER STUDY Patient Name:  SIDI DZIKOWSKI  Date of Exam:   04/17/2021 Medical Rec #: [id number]           Accession #:    [id number] Date of Birth: July 12, 1945           Patient Gender: M Patient Age:   21 years Exam Location:  [facility] Procedure:      VAS [place] ABI WITH/WO TBI Referring Phys: Marlin Canary --------------------------------------------------------------------------------  Indications: Claudication.  Comparison  Study: no prior Performing Technologist: [place] PonderMegan Stricklin RVS  Examination Guidelines: A complete evaluation includes at minimum, Doppler waveform signals and systolic blood pressure reading at the level of bilateral brachial, anterior tibial, and posterior tibial arteries, when vessel segments are accessible. Bilateral testing is considered an integral part of a complete examination. Photoelectric Plethysmograph (PPG) waveforms and toe systolic pressure readings are included as required and additional duplex testing as needed. Limited examinations for reoccurring indications may be performed as noted.  ABI Findings: +---------+------------------+-----+---------+--------+  Right     Rt Pressure (mmHg) Index Waveform  Comment   +---------+------------------+-----+---------+--------+  Brachial  160                      triphasic           +---------+------------------+-----+---------+--------+  PTA       183                1.14  triphasic           +---------+------------------+-----+---------+--------+  DP        207                1.29  triphasic           +---------+------------------+-----+---------+--------+  Great Toe 123                 0.77  Normal              +---------+------------------+-----+---------+--------+ +---------+------------------+-----+---------+-------+  Left      Lt Pressure (mmHg) Index Waveform  Comment  +---------+------------------+-----+---------+-------+  Brachial  147                      triphasic          +---------+------------------+-----+---------+-------+  PTA       204                1.27  triphasic          +---------+------------------+-----+---------+-------+  DP        196                1.23  triphasic          +---------+------------------+-----+---------+-------+  Great Toe 123                0.77  Normal             +---------+------------------+-----+---------+-------+ +-------+-----------+-----------+------------+------------+  ABI/TBI Today's ABI Today's TBI Previous ABI Previous TBI  +-------+-----------+-----------+------------+------------+  Right   1.29        0.77                                   +-------+-----------+-----------+------------+------------+  Left    1.27        0.77                                   +-------+-----------+-----------+------------+------------+  Summary: Right: Resting right ankle-brachial index is within normal range. No evidence of significant right lower extremity arterial disease. The right toe-brachial index is normal. Left: Resting left ankle-brachial index is within normal range. No evidence of significant left lower extremity arterial disease. The left toe-brachial index is normal.  *See table(s) above for measurements and observations.  Electronically signed by Waverly Ferrarihristopher Dickson MD on 04/17/2021 at 3:23:07 PM.    Final  Medications:    acidophilus  1 capsule Oral Daily   B-complex with vitamin C  1 tablet Oral BID   folic acid  1 mg Oral Daily   gabapentin  300 mg Oral QHS   pramipexole  1.5 mg Oral Daily   pramipexole  1.5 mg Oral q1600   predniSONE  2.5 mg Oral Daily   tamsulosin  0.4 mg Oral QHS   Vibegron  75 mg Oral Daily    Continuous Infusions:     LOS: 6 days   Hughie Closs  [facility]   How to contact the [facility] Attending or Consulting provider 7A - 7P or covering provider during after hours 7P -7A, for this patient?  Check the care team in [facility] and look for a) attending/consulting [facility] provider listed and b) the [facility] team listed Log into [email or web address] and use [place] universal password to access. If you do not have the password, please contact the hospital operator. Locate the [facility] provider you are looking for under [facility] and page to a number that you can be directly reached. If you still have difficulty reaching the provider, please page the [facility] (Director on Call) for the Hospitalists listed on amion for assistance.  04/18/2021, 1:57 PM

## 2021-04-19 ENCOUNTER — Inpatient Hospital Stay (HOSPITAL_COMMUNITY): Payer: No Typology Code available for payment source

## 2021-04-19 LAB — SYNOVIAL CELL COUNT + DIFF, W/ CRYSTALS
Crystals, Fluid: NONE SEEN
Eosinophils-Synovial: 0 % (ref 0–1)
Lymphocytes-Synovial Fld: 0 % (ref 0–20)
Monocyte-Macrophage-Synovial Fluid: 23 % — ABNORMAL LOW (ref 50–90)
Neutrophil, Synovial: 77 % — ABNORMAL HIGH (ref 0–25)
WBC, Synovial: 17745 /mm3 — ABNORMAL HIGH (ref 0–200)

## 2021-04-19 IMAGING — DX DG KNEE 1-2V*L*
2 series · 2 of 2 positions shown · non-contrast
Comparison: None

CLINICAL DATA: LEFT knee pain, limited range of motion, no known
injury

EXAM:
LEFT KNEE - 1-2 VIEW

[knee ap]
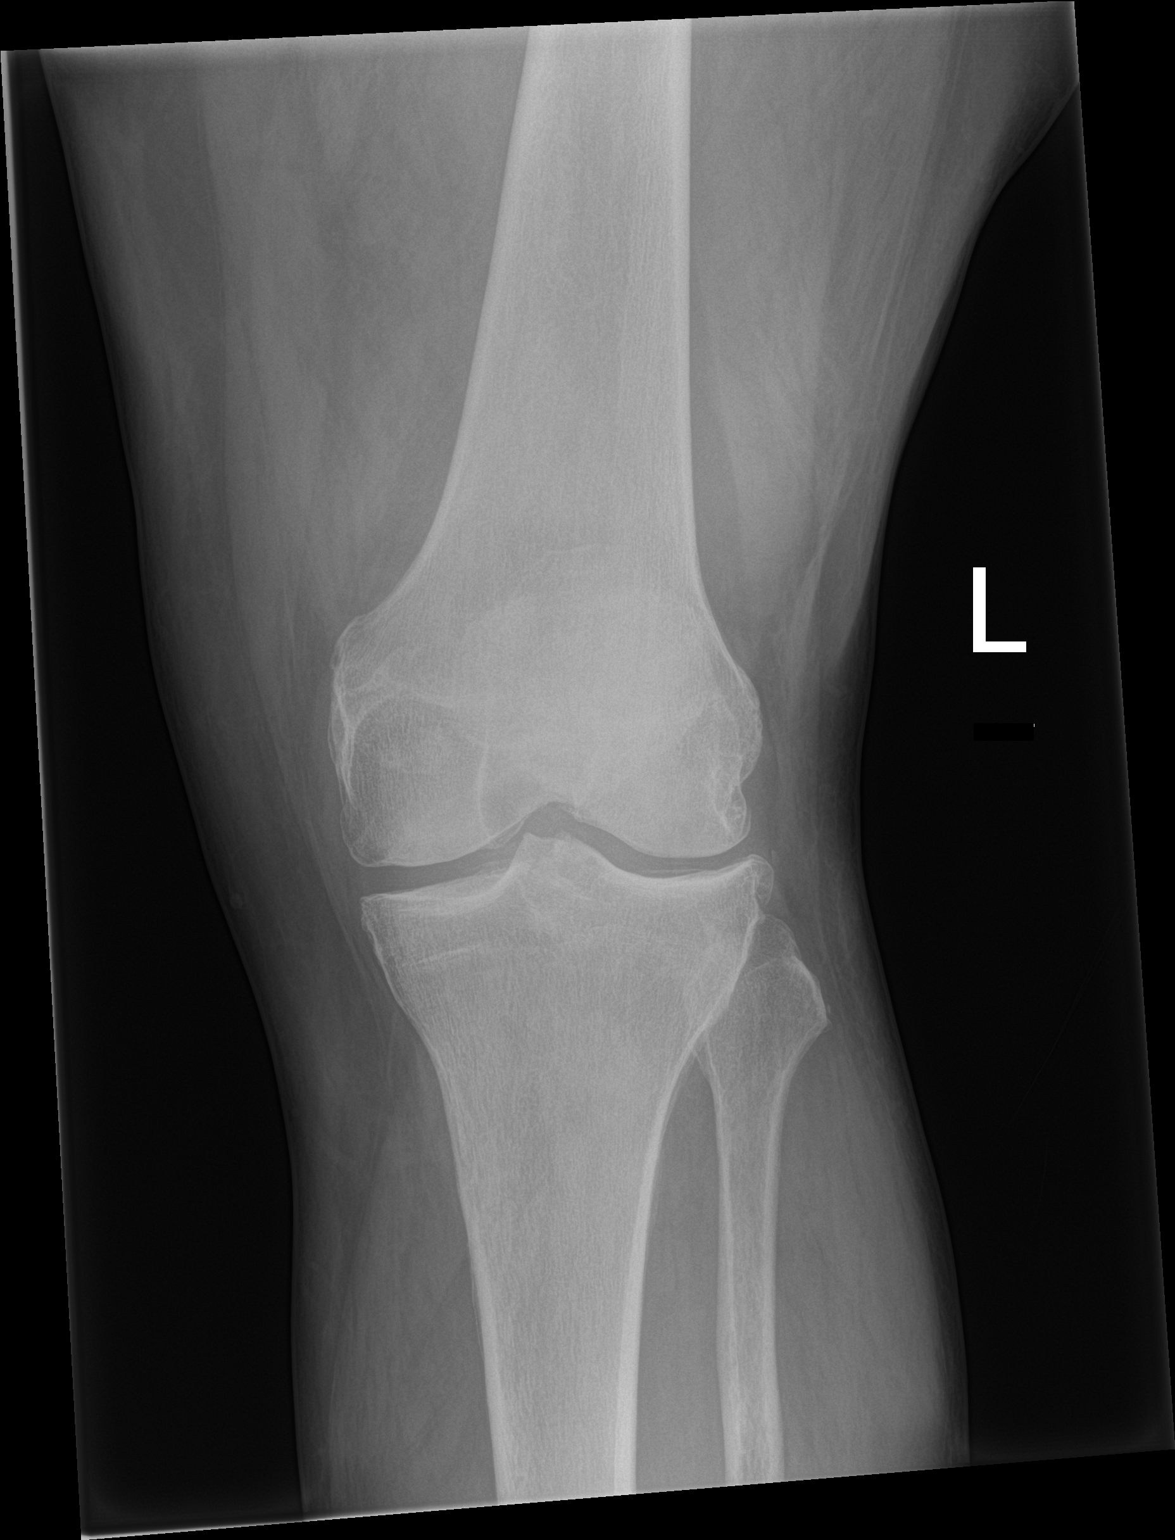

[knee lat]
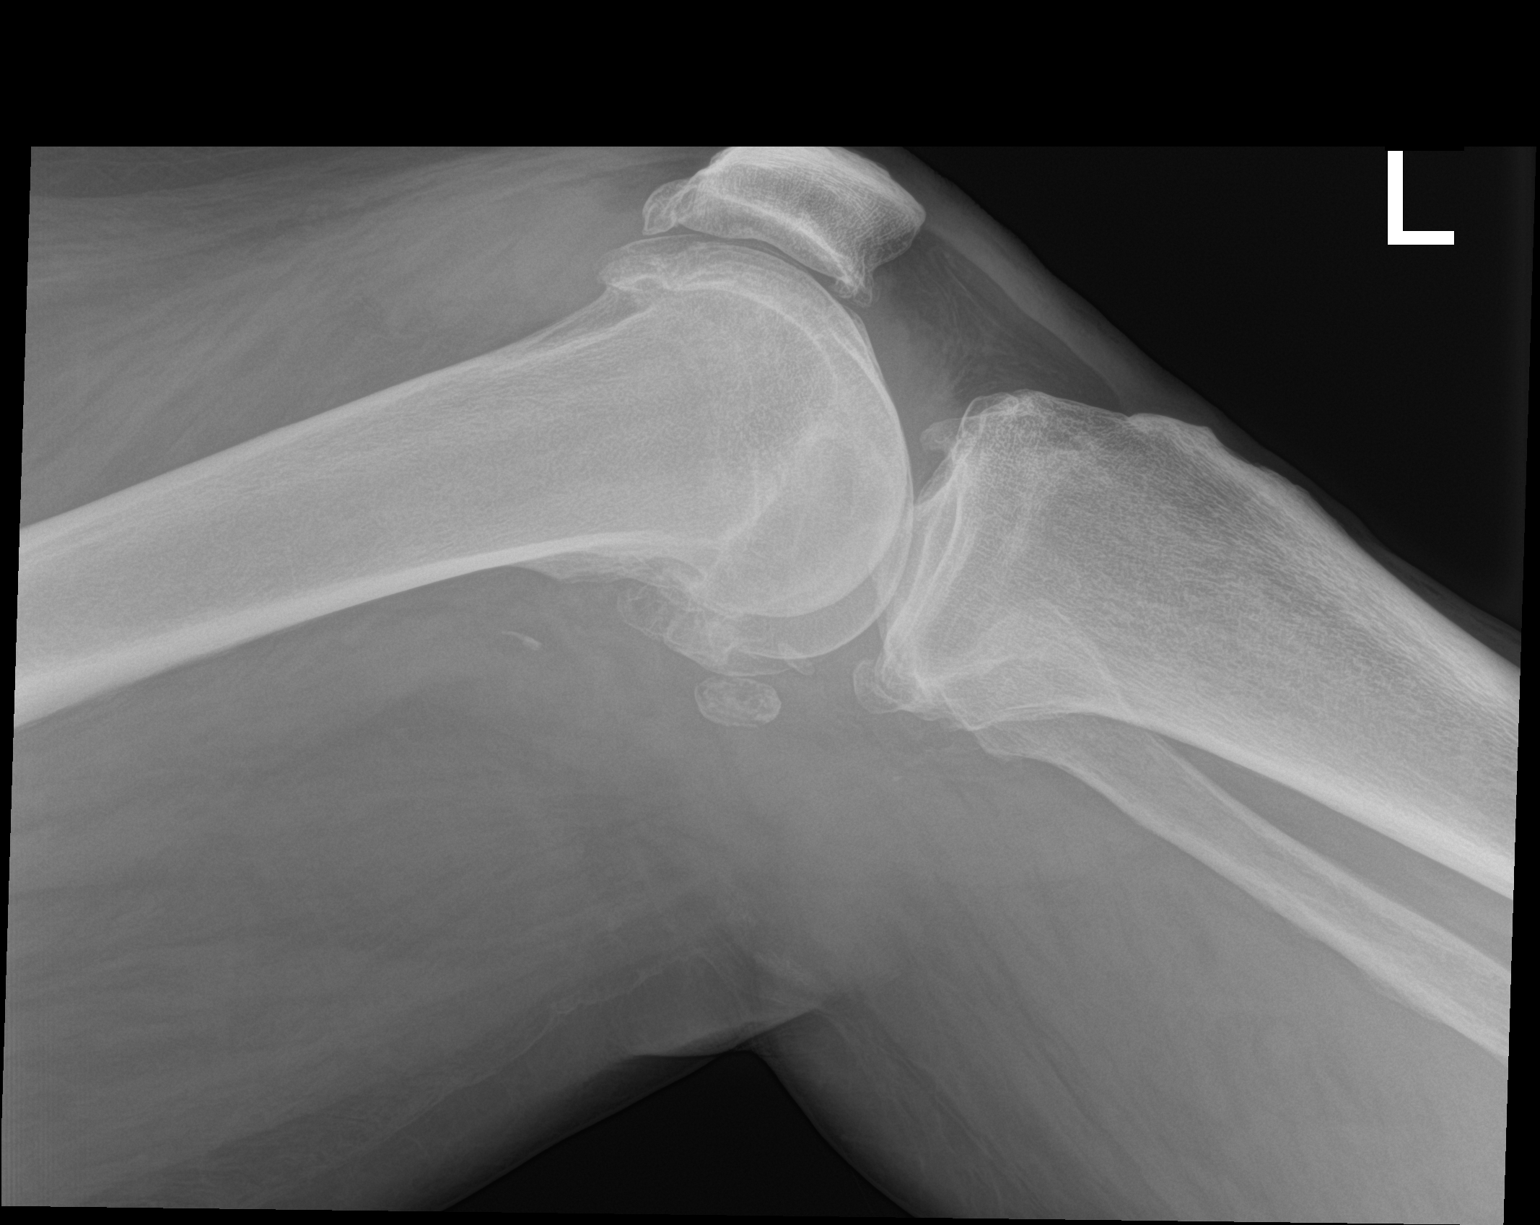

[2 of 2 positions shown; findings below may reference images not displayed]

FINDINGS: Osseous mineralization low normal.

Scattered joint space narrowing and spur formation greatest at
patellofemoral joint.

No acute fracture, dislocation, or bone destruction.

Trace joint effusion.
IMPRESSION: Tricompartmental degenerative changes of the LEFT knee greatest at
patellofemoral joint.

## 2021-04-19 MED ORDER — LIDOCAINE HCL 1 % IJ SOLN
INTRAMUSCULAR | Status: AC
  Filled 2021-04-19: qty 20

## 2021-04-19 MED ORDER — METHYLPREDNISOLONE ACETATE 40 MG/ML IJ SUSP
INTRAMUSCULAR | Status: AC
  Filled 2021-04-19: qty 1

## 2021-04-19 MED ORDER — BUPIVACAINE HCL (PF) 0.25 % IJ SOLN
INTRAMUSCULAR | Status: AC
  Filled 2021-04-19: qty 10

## 2021-04-19 NOTE — Consult Note (Signed)
ORTHOPAEDIC CONSULTATION  REQUESTING PHYSICIAN: Hughie Closs, MD  Chief Complaint: "My left knee hurts"  HPI: Tony Mcintosh is a 76 y.o. male who presents with left knee pain.  Patient has a history of bilateral knee pain with history of osteoarthritis.  However, over the last 5 days he has noticed progressive worsening left knee pain without any injury.  He has had a lot of swelling in the left knee as well.  Denies any fevers, chills, night sweats.  No history of prior infection in the joint or any history of gout.  He does have history of injections in his knees with most recent injection in the right knee somewhat recently but he has never had to have any fluid aspirated from either knee.  No other joints are causing him distress.  He has difficulty weightbearing due to the pain that this is causing.  Past Medical History:  Diagnosis Date   Back complaints    BPH (benign prostatic hyperplasia)    Chronic back pain    Lumbar stenosis    Osteoporosis    Vitamin D deficiency    Past Surgical History:  Procedure Laterality Date   BACK SURGERY  2011   CARPAL TUNNEL RELEASE     lumbar back surger     Social History   Socioeconomic History   Marital status: Married    Spouse name: Not on file   Number of children: Not on file   Years of education: Not on file   Highest education level: Not on file  Occupational History   Not on file  Tobacco Use   Smoking status: Never   Smokeless tobacco: Not on file  Substance and Sexual Activity   Alcohol use: Yes    Comment: 1 beer/wk   Drug use: Not on file   Sexual activity: Not on file  Other Topics Concern   Not on file  Social History Narrative   Not on file   Social Determinants of Health   Financial Resource Strain: Not on file  Food Insecurity: Not on file  Transportation Needs: Not on file  Physical Activity: Not on file  Stress: Not on file  Social Connections: Not on file   Family History  Problem Relation  Age of Onset   Stroke Father    - negative except otherwise stated in the family history section No Known Allergies Prior to Admission medications   Medication Sig Start Date End Date Taking? Authorizing Provider  b complex vitamins capsule Take 1 capsule by mouth 2 (two) times daily.   Yes [provider]  calcium carbonate (TUMS - DOSED IN MG ELEMENTAL CALCIUM) 500 MG chewable tablet Chew 2 tablets by mouth daily.   Yes [provider]  Cholecalciferol (VITAMIN D3) 1.25 MG (50000 UT) CAPS Take 1 capsule by mouth once a week. 08/06/19  Yes [provider]  folic acid (FOLVITE) 1 MG tablet Take 1 mg by mouth daily. 08/06/19  Yes [provider]  gabapentin (NEURONTIN) 300 MG capsule Take 300 mg by mouth at bedtime.   Yes [provider]  HYDROcodone-acetaminophen (NORCO/VICODIN) 5-325 MG tablet Take 1 tablet by mouth every 6 (six) hours as needed for moderate pain.   Yes [provider]  methotrexate (RHEUMATREX) 2.5 MG tablet Take 7 tablets by mouth once a week. 08/06/19  Yes [provider]  pramipexole (MIRAPEX) 1 MG tablet Take 1 tablet by mouth 2 (two) times daily. 07/27/19  Yes [provider]  predniSONE (DELTASONE) 5 MG tablet Take 5 mg by mouth daily. 08/06/19  Yes [provider]  tamsulosin (FLOMAX) 0.4 MG CAPS capsule Take 0.4 mg by mouth at bedtime. 07/26/19  Yes [provider]  Vibegron 75 MG TABS Take 75 mg by mouth daily. 03/02/21  Yes [provider]   DG Knee 1-2 Views Left  Result Date: 04/19/2021 CLINICAL DATA:  LEFT knee pain, limited range of motion, no known injury EXAM: LEFT KNEE - 1-2 VIEW COMPARISON:  None FINDINGS: Osseous mineralization low normal. Scattered joint space narrowing and spur formation greatest at patellofemoral joint. No acute fracture, dislocation, or bone destruction. Trace joint effusion. 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 Electronically Signed   By: Ulyses SouthwardMark  Boles M.D.   On: 04/19/2021 14:12   - pertinent xrays, CT, MRI studies were reviewed and independently interpreted  Positive ROS: All other systems have been reviewed and were otherwise negative with the exception of those mentioned in the HPI and as above.  Physical Exam: General: Alert, no acute distress Psychiatric: Patient is competent for consent with normal mood and affect Lymphatic: No axillary or cervical lymphadenopathy Cardiovascular: No pedal edema Respiratory: No cyanosis, no use of accessory musculature GI: No organomegaly, abdomen is soft and non-tender    Images:  @ENCIMAGES @  Labs:  Lab Results  Component Value Date   HGBA1C 6.3 (H) 04/12/2021   HGBA1C 6.3 (H) 04/11/2021   HGBA1C 5.9 03/02/2019    Lab Results  Component Value Date   ALBUMIN 4.1 04/12/2021   ALBUMIN 3.6 04/12/2021   ALBUMIN 4.2 04/11/2021    Neurologic: Patient does not have protective sensation bilateral lower extremities.   MUSCULOSKELETAL:   Ortho exam demonstrates left knee with large effusion.  Mildly increased warmth compared with contralateral knee.  Range of motion from about 0 degrees extension to 70 degrees of flexion somewhat comfortably but flexion past 70 degrees is quite painful.  Contralateral knee flexes to 90 degrees without difficulty.  No effusion in the contralateral knee.  No calf tenderness.  Negative Homans' sign.  Patient is able to perform straight leg raise with the right leg but cannot do this with the left leg due to pain in the left knee.  There is no pain with passive motion of the bilateral ankles, hips, shoulders, elbows, wrist.  No tenderness over the [facility] or [place] joints bilaterally.  Assessment: Left knee effusion  Plan: Left knee was aspirated of approximately 60 to 65 cc of cloudy blood-tinged synovial fluid.  Did not appear purulent.  Fluid will be sent off for synovial fluid analysis with crystal  analysis, anaerobic/aerobic culture, Gram stain.  Plan to follow-up on these results tomorrow morning and if they are negative for infection, consider cortisone injection into the joint for symptomatic relief.  Based on the appearance of the fluid, appears to be gout or pseudogout but will hold off on any injection until confirmation by analysis.  Order put in for n.p.o. after midnight in case he has septic arthritis though this is doubtful.  Thank you for the consult and the opportunity to see Mr. Roney Marionesselroade  Luke Rumor Sun, PA-C [facility] [phone] 9:07 PM

## 2021-04-19 NOTE — Progress Notes (Addendum)
IP rehab admissions - I met with patient in his room today with wife and son present.  Patient, wife and son are worried about patient's left knee.  Left knee with increased pain and swelling with a "fluid collection" area left lateral knee.  I did chat message Dr. Einar Grad about the above issue.  I did receive a call from [place] and I faxed all info to the [place].  Awaiting call back later today or in the am from the [place] regarding potential inpatient rehab admission.  I will follow up after I hear back from insurance case manager.  Call for questions.  [phone]  We have received authorization for acute inpatient rehab admission today.  Will follow up in am for bed availability and medical readiness.  Call for questions.  [phone]

## 2021-04-19 NOTE — Progress Notes (Addendum)
Pt had been complaining of increased pain in his right knee throughout the day.  A 2-view was obtained this afternoon, which the MD was aware of.  Orthopedic PA in and aspirated approximately 60 ml of clear yellow synovial fluid from the right knee joint capsule.  This was sent off for C&S and Gram stain.   Family remains very concerned re: potential effect of immobility,  Pt refusing to mobilize much because of increased pain in his left knee.

## 2021-04-19 NOTE — Progress Notes (Signed)
Pt. Has home cpap. Places on himself.  

## 2021-04-19 NOTE — Progress Notes (Addendum)
Progress Note    Tony Mcintosh  [id number] DOB: 1946-03-14  DOA: 04/11/2021 PCP: [facility]    Brief Narrative:     Medical records reviewed and are as summarized below:  Tony Mcintosh is an 76 y.o. male with medical history significant for lumbar spinal stenosis, peripheral polyneuropathy, chronic back pain, restless leg syndrome, BPH, who is admitted to [facility] on 04/11/2021 for further evaluation and management of presenting numbness/weakness involving the bilateral lower extremities. s/p LP.  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  S/p 5 days of IVIG.  CIR recommended by PT.  Await insurance authorization.     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 -IVIG x 5 days -completed -PT recommends CIR, awaiting insurance authorization and bed placement.  Restless leg syndrome, -on scheduled Mirapex as an outpatient- dose increased   Benign Prostatic Hyperplasia:   -on tamsulosin/vibegron as outpatient.    Obstructive sleep apnea:  -nocturnal CPAP.  Constipation: Resolved.  Norovirus infection/diarrhea: Patient had diarrhea a few days ago, symptom was collected on 04/16/2021 which is positive for norovirus.  He is on contact isolation.  His  diarrhea had resolved and instead he developed constipation which required Dulcolax suppository.  Left knee pain: Patient complains of left knee pain which is causing the weakness in the left lower extremity.  On examination there is no worsening edema, erythema or tenderness when compared to the right knee.  I did not appreciate any effusion either.  Patient is requesting further work-up.  We will proceed with x-ray first.  Addendum: X-ray shows arthritis with very minimal effusion.  Family concerned and demands Ortho consult.  Consulted orthopedic/discussed with Dr. Marlou Sa who will see patient.  obesity Body mass index is 43.75 kg/m.  Arthritis- -was started on prednisone and methotrexate -hold methotrexate for now -wean down prednisone to lowest dose where he still has relief-currently on 2.5 mg daily. -follows with Dr. Scarlette Shorts in [place]  Family Communication/Anticipated D/C date and plan/Code Status   DVT prophylaxis: Lovenox ordered. Code Status: Full Code.  Disposition Plan: Status is: inpt  Status is: Inpatient  Remains inpatient appropriate because: Awaiting CIR placement.   Medical Consultants:   NS neurology   Subjective:   Seen and examined.  Son at the bedside.  Patient complains of left knee pain. Objective:    Vitals:   04/18/21 1631 04/18/21 1949 04/19/21 0604 04/19/21 0758  BP: (!) 148/79 (!) 143/81 138/81 (!) 142/83  Pulse: 77 78 72 76  Resp: 16 17 17 18   Temp: 97.8 F (36.6 C) 97.8 F (36.6 C) 97.6 F (36.4 C) (!) 97.5 F (36.4 C)  TempSrc: Oral Oral Oral Oral  SpO2: 96% 95% 94% 99%  Weight:   (!) 138.3 kg   Height:        Intake/Output Summary (Last 24 hours) at  04/19/2021 1312 Last data filed at 04/19/2021 0730 Gross per 24 hour  Intake --  Output 600 ml  Net -600 ml    Filed Weights   04/14/21 0500 04/17/21 0320 04/19/21 0604  Weight: (!) 139 kg (!) 140.4 kg (!) 138.3 kg    Exam:  General exam: Appears calm and comfortable,  morbidly obese Respiratory system: Clear to auscultation. Respiratory effort normal. Cardiovascular system: S1 & S2 heard, RRR. No JVD, murmurs, rubs, gallops or clicks. No pedal edema. Gastrointestinal system: Abdomen is nondistended, soft and nontender. No organomegaly or masses felt. Normal bowel sounds heard. Central nervous system: Alert and oriented. No focal neurological deficits. Extremities: Symmetric 5 x 5 power. Skin: No rashes, lesions or ulcers.  Psychiatry: Judgement and insight appear normal. Mood & affect appropriate.    Data Reviewed:   I have personally reviewed following labs and imaging studies:  Labs: Labs show the following:   Basic Metabolic Panel: Recent Labs  Lab 04/17/21 0553  NA 132*  K 3.8  CL 100  CO2 26  GLUCOSE 104*  BUN 16  CREATININE 0.95  CALCIUM 8.8*    GFR Estimated Creatinine Clearance: 91.3 mL/min (by C-G formula based on SCr of 0.95 mg/dL). Liver Function Tests: No results for input(s): AST, ALT, ALKPHOS, BILITOT, PROT, ALBUMIN in the last 168 hours.  No results for input(s): LIPASE, AMYLASE in the last 168 hours. No results for input(s): AMMONIA in the last 168 hours. Coagulation profile No results for input(s): INR, PROTIME in the last 168 hours.   CBC: Recent Labs  Lab 04/17/21 0553  WBC 5.0  HGB 14.4  HCT 40.9  MCV 93.4  PLT 143*    Cardiac Enzymes: No results for input(s): CKTOTAL, CKMB, CKMBINDEX, TROPONINI in the last 168 hours. BNP (last 3 results) No results for input(s): PROBNP in the last 8760 hours. CBG: No results for input(s): GLUCAP in the last 168 hours.  D-Dimer: No results for input(s): DDIMER in the last 72 hours. Hgb A1c: No results for input(s): HGBA1C in the last 72 hours.  Lipid Profile: No results for input(s): CHOL, HDL, LDLCALC, TRIG, CHOLHDL, LDLDIRECT in the last 72 hours. Thyroid function studies: No results for input(s): TSH, T4TOTAL, T3FREE, THYROIDAB in the last 72  hours.  Invalid input(s): FREET3  Anemia work up: No results for input(s): VITAMINB12, FOLATE, FERRITIN, TIBC, IRON, RETICCTPCT in the last 72 hours.  Sepsis Labs: Recent Labs  Lab 04/17/21 0553  WBC 5.0     Microbiology Recent Results (from the past 240 hour(s))  Resp Panel by RT-PCR (Flu A&B, Covid) Nasopharyngeal Swab     Status: None   Collection Time: 04/11/21  8:04 PM   Specimen: Nasopharyngeal Swab; Nasopharyngeal(NP) swabs in vial transport medium  Result Value Ref Range Status   SARS Coronavirus 2 by RT PCR NEGATIVE NEGATIVE Final    Comment: (NOTE) SARS-CoV-2 target nucleic acids are NOT DETECTED.  The SARS-CoV-2 RNA is generally detectable in upper respiratory specimens during the acute phase of infection. The lowest concentration of SARS-CoV-2 viral copies this assay can detect is 138 copies/mL. A negative result does not preclude SARS-Cov-2 infection and should not be used as the sole basis for treatment or other patient management decisions. A negative result may occur with  improper specimen collection/handling, submission of specimen other than nasopharyngeal swab, presence of viral mutation(s) within the areas targeted by this assay, and inadequate number of viral copies(<138 copies/mL). A negative result must be combined with clinical observations,  patient history, and epidemiological information. The expected result is Negative.  Fact Sheet for Patients:  EntrepreneurPulse.com.au  Fact Sheet for Healthcare Providers:  IncredibleEmployment.be  This test is no t yet approved or cleared by the [place] FDA and  has been authorized for detection and/or diagnosis of SARS-CoV-2 by FDA under an Emergency Use Authorization (EUA). This EUA will remain  in effect (meaning this test can be used) for the duration of the COVID-19 declaration under Section 564(b)(1) of the Act, 21 U.S.C.section 360bbb-3(b)(1), unless the  authorization is terminated  or revoked sooner.       Influenza A by PCR NEGATIVE NEGATIVE Final   Influenza B by PCR NEGATIVE NEGATIVE Final    Comment: (NOTE) The Xpert Xpress SARS-CoV-2/FLU/RSV plus assay is intended as an aid in the diagnosis of influenza from Nasopharyngeal swab specimens and should not be used as a sole basis for treatment. Nasal washings and aspirates are unacceptable for Xpert Xpress SARS-CoV-2/FLU/RSV testing.  Fact Sheet for Patients: EntrepreneurPulse.com.au  Fact Sheet for Healthcare Providers: IncredibleEmployment.be  This test is not yet approved or cleared by the [place] FDA and has been authorized for detection and/or diagnosis of SARS-CoV-2 by FDA under an Emergency Use Authorization (EUA). This EUA will remain in effect (meaning this test can be used) for the duration of the COVID-19 declaration under Section 564(b)(1) of the Act, 21 U.S.C. section 360bbb-3(b)(1), unless the authorization is terminated or revoked.  Performed at [facility], [address], [place], [place] [address]   Gastrointestinal Panel by PCR , Stool     Status: Abnormal   Collection Time: 04/16/21 10:29 AM   Specimen: Stool  Result Value Ref Range Status   Campylobacter species NOT DETECTED NOT DETECTED Final   Plesimonas shigelloides NOT DETECTED NOT DETECTED Final   Salmonella species NOT DETECTED NOT DETECTED Final   Yersinia enterocolitica NOT DETECTED NOT DETECTED Final   Vibrio species NOT DETECTED NOT DETECTED Final   Vibrio cholerae NOT DETECTED NOT DETECTED Final   Enteroaggregative E coli (EAEC) NOT DETECTED NOT DETECTED Final   Enteropathogenic E coli (EPEC) NOT DETECTED NOT DETECTED Final   Enterotoxigenic E coli (ETEC) NOT DETECTED NOT DETECTED Final   Shiga like toxin producing E coli (STEC) NOT DETECTED NOT DETECTED Final   Shigella/Enteroinvasive E coli (EIEC) NOT DETECTED NOT DETECTED Final    Cryptosporidium NOT DETECTED NOT DETECTED Final   Cyclospora cayetanensis NOT DETECTED NOT DETECTED Final   Entamoeba histolytica NOT DETECTED NOT DETECTED Final   Giardia lamblia NOT DETECTED NOT DETECTED Final   Adenovirus F40/41 NOT DETECTED NOT DETECTED Final   Astrovirus NOT DETECTED NOT DETECTED Final   Norovirus GI/GII DETECTED (A) NOT DETECTED Final    Comment: RESULT CALLED TO, READ BACK BY AND VERIFIED WITH: Wonda Horner RN [id number] 04/18/21 HNM    Rotavirus A NOT DETECTED NOT DETECTED Final   Sapovirus (I, II, IV, and V) NOT DETECTED NOT DETECTED Final    Comment: Performed at [facility], [address] [place], [place] [address]    Procedures and diagnostic studies:  VAS [place] ABI WITH/WO TBI  Result Date: 04/17/2021  LOWER EXTREMITY DOPPLER STUDY Patient Name:  CHAMP GIALANELLA  Date of Exam:   04/17/2021 Medical Rec #: [id number]           Accession #:    [id number] Date of Birth: [birth year]           Patient Gender: M Patient Age:  76 years Exam Location:  [facility] Procedure:      VAS [place] ABI WITH/WO TBI Referring Phys: JESSICA VANN --------------------------------------------------------------------------------  Indications: Claudication.  Comparison Study: no prior Performing Technologist: Archie Patten RVS  Examination Guidelines: A complete evaluation includes at minimum, Doppler waveform signals and systolic blood pressure reading at the level of bilateral brachial, anterior tibial, and posterior tibial arteries, when vessel segments are accessible. Bilateral testing is considered an integral part of a complete examination. Photoelectric Plethysmograph (PPG) waveforms and toe systolic pressure readings are included as required and additional duplex testing as needed. Limited examinations for reoccurring indications may be performed as noted.  ABI Findings: +---------+------------------+-----+---------+--------+  Right     Rt Pressure (mmHg) Index Waveform   Comment   +---------+------------------+-----+---------+--------+  Brachial  160                      triphasic           +---------+------------------+-----+---------+--------+  PTA       183                1.14  triphasic           +---------+------------------+-----+---------+--------+  DP        207                1.29  triphasic           +---------+------------------+-----+---------+--------+  Great Toe 123                0.77  Normal              +---------+------------------+-----+---------+--------+ +---------+------------------+-----+---------+-------+  Left      Lt Pressure (mmHg) Index Waveform  Comment  +---------+------------------+-----+---------+-------+  Brachial  147                      triphasic          +---------+------------------+-----+---------+-------+  PTA       204                1.27  triphasic          +---------+------------------+-----+---------+-------+  DP        196                1.23  triphasic          +---------+------------------+-----+---------+-------+  Great Toe 123                0.77  Normal             +---------+------------------+-----+---------+-------+ +-------+-----------+-----------+------------+------------+  ABI/TBI Today's ABI Today's TBI Previous ABI Previous TBI  +-------+-----------+-----------+------------+------------+  Right   1.29        0.77                                   +-------+-----------+-----------+------------+------------+  Left    1.27        0.77                                   +-------+-----------+-----------+------------+------------+  Summary: Right: Resting right ankle-brachial index is within normal range. No evidence of significant right lower extremity arterial disease. The right toe-brachial index is normal. Left: Resting left ankle-brachial index is within normal range. No evidence of significant left lower extremity arterial disease. The  left toe-brachial index is normal.  *See table(s) above for measurements and observations.   Electronically signed by Deitra Mayo MD on 04/17/2021 at 3:23:07 PM.    Final     Medications:    acidophilus  1 capsule Oral Daily   B-complex with vitamin C  1 tablet Oral BID   folic acid  1 mg Oral Daily   gabapentin  300 mg Oral QHS   pramipexole  1.5 mg Oral Daily   pramipexole  1.5 mg Oral q1600   predniSONE  2.5 mg Oral Daily   tamsulosin  0.4 mg Oral QHS   Vibegron  75 mg Oral Daily   Continuous Infusions:     LOS: 7 days   Darliss Cheney  [facility]   How to contact the [facility] Attending or Consulting provider [place] or covering provider during after hours [place], for this patient?  Check the care team in [facility] and look for a) attending/consulting [facility] provider listed and b) the [facility] team listed Log into [email or web address] and use [place]'s universal password to access. If you do not have the password, please contact the hospital operator. Locate the [facility] provider you are looking for under [facility] and page to a number that you can be directly reached. If you still have difficulty reaching the provider, please page the [facility] (Director on Call) for the Hospitalists listed on amion for assistance.  04/19/2021, 1:12 PM

## 2021-04-20 ENCOUNTER — Inpatient Hospital Stay (HOSPITAL_COMMUNITY)
Admission: RE | Admit: 2021-04-20 | Discharge: 2021-05-09 | DRG: 945 | Disposition: A | Discharge status: Home Health | Payer: No Typology Code available for payment source | Source: Intra-hospital | Attending: Physical Medicine and Rehabilitation | Admitting: Physical Medicine and Rehabilitation

## 2021-04-20 ENCOUNTER — Other Ambulatory Visit: Payer: Self-pay

## 2021-04-20 ENCOUNTER — Other Ambulatory Visit: Payer: Self-pay | Admitting: Surgical

## 2021-04-20 ENCOUNTER — Encounter (HOSPITAL_COMMUNITY): Payer: Self-pay | Admitting: Physical Medicine and Rehabilitation

## 2021-04-20 DIAGNOSIS — N3281 Overactive bladder: Secondary | ICD-10-CM | POA: Diagnosis present

## 2021-04-20 DIAGNOSIS — M810 Age-related osteoporosis without current pathological fracture: Secondary | ICD-10-CM | POA: Diagnosis present

## 2021-04-20 DIAGNOSIS — M069 Rheumatoid arthritis, unspecified: Secondary | ICD-10-CM | POA: Diagnosis present

## 2021-04-20 DIAGNOSIS — G2581 Restless legs syndrome: Secondary | ICD-10-CM | POA: Diagnosis present

## 2021-04-20 DIAGNOSIS — G4733 Obstructive sleep apnea (adult) (pediatric): Secondary | ICD-10-CM | POA: Diagnosis present

## 2021-04-20 DIAGNOSIS — E559 Vitamin D deficiency, unspecified: Secondary | ICD-10-CM | POA: Diagnosis present

## 2021-04-20 DIAGNOSIS — G610 Guillain-Barre syndrome: Secondary | ICD-10-CM | POA: Diagnosis present

## 2021-04-20 DIAGNOSIS — N400 Enlarged prostate without lower urinary tract symptoms: Secondary | ICD-10-CM | POA: Diagnosis present

## 2021-04-20 DIAGNOSIS — K5900 Constipation, unspecified: Secondary | ICD-10-CM | POA: Diagnosis present

## 2021-04-20 DIAGNOSIS — M62838 Other muscle spasm: Secondary | ICD-10-CM | POA: Diagnosis present

## 2021-04-20 DIAGNOSIS — R5381 Other malaise: Secondary | ICD-10-CM | POA: Diagnosis present

## 2021-04-20 DIAGNOSIS — Z6841 Body Mass Index (BMI) 40.0 and over, adult: Secondary | ICD-10-CM

## 2021-04-20 DIAGNOSIS — M48061 Spinal stenosis, lumbar region without neurogenic claudication: Secondary | ICD-10-CM | POA: Diagnosis present

## 2021-04-20 DIAGNOSIS — M25511 Pain in right shoulder: Secondary | ICD-10-CM

## 2021-04-20 DIAGNOSIS — E538 Deficiency of other specified B group vitamins: Secondary | ICD-10-CM | POA: Diagnosis present

## 2021-04-20 DIAGNOSIS — D72818 Other decreased white blood cell count: Secondary | ICD-10-CM | POA: Diagnosis not present

## 2021-04-20 DIAGNOSIS — R29898 Other symptoms and signs involving the musculoskeletal system: Secondary | ICD-10-CM

## 2021-04-20 DIAGNOSIS — D696 Thrombocytopenia, unspecified: Secondary | ICD-10-CM | POA: Diagnosis present

## 2021-04-20 DIAGNOSIS — G629 Polyneuropathy, unspecified: Secondary | ICD-10-CM | POA: Diagnosis present

## 2021-04-20 DIAGNOSIS — G8929 Other chronic pain: Secondary | ICD-10-CM | POA: Diagnosis present

## 2021-04-20 LAB — VITAMIN E
Vitamin E (Alpha Tocopherol): 6.7 mg/L — ABNORMAL LOW (ref 9.0–29.0)
Vitamin E(Gamma Tocopherol): 1.5 mg/L (ref 0.5–4.9)

## 2021-04-20 LAB — SYNOVIAL CELL COUNT + DIFF, W/ CRYSTALS
Crystals, Fluid: NONE SEEN
Eosinophils-Synovial: 0 % (ref 0–1)
Lymphocytes-Synovial Fld: 2 % (ref 0–20)
Monocyte-Macrophage-Synovial Fluid: 17 % — ABNORMAL LOW (ref 50–90)
Neutrophil, Synovial: 81 % — ABNORMAL HIGH (ref 0–25)
WBC, Synovial: 12750 /mm3 — ABNORMAL HIGH (ref 0–200)

## 2021-04-20 LAB — VITAMIN D 25 HYDROXY (VIT D DEFICIENCY, FRACTURES): Vit D, 25-Hydroxy: 44.45 ng/mL (ref 30–100)

## 2021-04-20 LAB — CREATININE, SERUM
Creatinine, Ser: 0.96 mg/dL (ref 0.61–1.24)
GFR, Estimated: >60 mL/min (ref >60)

## 2021-04-20 MED ORDER — POLYETHYLENE GLYCOL 3350 17 G PO PACK
17.0000 g | PACK | Freq: Every day | ORAL | Status: DC | PRN
  Administered 2021-04-22 – 2021-04-23 (×2): 17 g via ORAL
  Filled 2021-04-20 (×2): qty 1

## 2021-04-20 MED ORDER — PROCHLORPERAZINE MALEATE 5 MG PO TABS: 5.0000 mg | ORAL_TABLET | Freq: Four times a day (QID) | ORAL | Status: DC | PRN

## 2021-04-20 MED ORDER — ALUM & MAG HYDROXIDE-SIMETH 200-200-20 MG/5ML PO SUSP: 30.0000 mL | ORAL | Status: DC | PRN

## 2021-04-20 MED ORDER — METHOTREXATE 2.5 MG PO TABS
17.5000 mg | ORAL_TABLET | ORAL | Status: DC
  Filled 2021-04-20: qty 7

## 2021-04-20 MED ORDER — VIBEGRON 75 MG PO TABS
75.0000 mg | ORAL_TABLET | Freq: Every day | ORAL | Status: DC
  Administered 2021-04-21 – 2021-05-09 (×19): 75 mg via ORAL
  Filled 2021-04-20 (×21): qty 1

## 2021-04-20 MED ORDER — PREDNISONE 2.5 MG PO TABS
2.5000 mg | ORAL_TABLET | Freq: Every day | ORAL | Status: DC
  Administered 2021-04-21 – 2021-04-22 (×2): 2.5 mg via ORAL
  Filled 2021-04-20 (×2): qty 1

## 2021-04-20 MED ORDER — PRAMIPEXOLE DIHYDROCHLORIDE 1 MG PO TABS
1.0000 mg | ORAL_TABLET | Freq: Two times a day (BID) | ORAL | Status: DC
  Administered 2021-04-20 – 2021-04-24 (×8): 1 mg via ORAL
  Filled 2021-04-20 (×8): qty 1

## 2021-04-20 MED ORDER — TAMSULOSIN HCL 0.4 MG PO CAPS
0.4000 mg | ORAL_CAPSULE | Freq: Every day | ORAL | Status: DC
  Administered 2021-04-20: 0.4 mg via ORAL
  Filled 2021-04-20: qty 1

## 2021-04-20 MED ORDER — LOPERAMIDE HCL 2 MG PO CAPS: 2.0000 mg | ORAL_CAPSULE | ORAL | Status: DC | PRN

## 2021-04-20 MED ORDER — VITAMIN D (ERGOCALCIFEROL) 1.25 MG (50000 UNIT) PO CAPS
50000.0000 [IU] | ORAL_CAPSULE | ORAL | Status: DC
  Administered 2021-04-20 – 2021-05-04 (×3): 50000 [IU] via ORAL
  Filled 2021-04-20 (×3): qty 1

## 2021-04-20 MED ORDER — ACETAMINOPHEN 325 MG PO TABS
325.0000 mg | ORAL_TABLET | ORAL | Status: DC | PRN
  Administered 2021-04-21 – 2021-05-07 (×20): 650 mg via ORAL
  Filled 2021-04-20 (×23): qty 2

## 2021-04-20 MED ORDER — TRAZODONE HCL 50 MG PO TABS
25.0000 mg | ORAL_TABLET | Freq: Every evening | ORAL | Status: DC | PRN
  Administered 2021-04-20: 25 mg via ORAL
  Administered 2021-04-21 – 2021-04-22 (×2): 50 mg via ORAL
  Filled 2021-04-20 (×3): qty 1

## 2021-04-20 MED ORDER — GUAIFENESIN-DM 100-10 MG/5ML PO SYRP: 5.0000 mL | ORAL_SOLUTION | Freq: Four times a day (QID) | ORAL | Status: DC | PRN

## 2021-04-20 MED ORDER — CALCIUM CARBONATE 1250 (500 CA) MG PO TABS
1.0000 | ORAL_TABLET | Freq: Two times a day (BID) | ORAL | Status: DC
  Administered 2021-04-20 – 2021-05-09 (×38): 500 mg via ORAL
  Filled 2021-04-20 (×38): qty 1

## 2021-04-20 MED ORDER — FLEET ENEMA 7-19 GM/118ML RE ENEM: 1.0000 | ENEMA | Freq: Once | RECTAL | Status: DC | PRN

## 2021-04-20 MED ORDER — METHOCARBAMOL 500 MG PO TABS
500.0000 mg | ORAL_TABLET | Freq: Four times a day (QID) | ORAL | Status: DC | PRN
  Administered 2021-04-20 – 2021-04-22 (×7): 500 mg via ORAL
  Filled 2021-04-20 (×7): qty 1

## 2021-04-20 MED ORDER — FOLIC ACID 1 MG PO TABS
1.0000 mg | ORAL_TABLET | Freq: Every day | ORAL | Status: DC
  Administered 2021-04-21 – 2021-05-09 (×19): 1 mg via ORAL
  Filled 2021-04-20 (×19): qty 1

## 2021-04-20 MED ORDER — ENOXAPARIN SODIUM 40 MG/0.4ML IJ SOSY
40.0000 mg | PREFILLED_SYRINGE | INTRAMUSCULAR | Status: DC
  Administered 2021-04-20 – 2021-04-22 (×3): 40 mg via SUBCUTANEOUS
  Filled 2021-04-20 (×3): qty 0.4

## 2021-04-20 MED ORDER — PROCHLORPERAZINE EDISYLATE 10 MG/2ML IJ SOLN: 5.0000 mg | Freq: Four times a day (QID) | INTRAMUSCULAR | Status: DC | PRN

## 2021-04-20 MED ORDER — RISAQUAD PO CAPS
1.0000 | ORAL_CAPSULE | Freq: Every day | ORAL | Status: DC
  Administered 2021-04-21 – 2021-05-09 (×19): 1 via ORAL
  Filled 2021-04-20 (×19): qty 1

## 2021-04-20 MED ORDER — SORBITOL 70 % SOLN
30.0000 mL | Freq: Every day | Status: DC | PRN
  Filled 2021-04-20: qty 30

## 2021-04-20 MED ORDER — PREDNISONE 2.5 MG PO TABS: 2.5000 mg | ORAL_TABLET | Freq: Every day | ORAL | 0 refills | Status: DC

## 2021-04-20 MED ORDER — PROCHLORPERAZINE 25 MG RE SUPP
12.5000 mg | Freq: Four times a day (QID) | RECTAL | Status: DC | PRN
  Filled 2021-04-20: qty 1

## 2021-04-20 MED ORDER — GABAPENTIN 300 MG PO CAPS
300.0000 mg | ORAL_CAPSULE | Freq: Every day | ORAL | Status: DC
  Administered 2021-04-20 – 2021-05-08 (×19): 300 mg via ORAL
  Filled 2021-04-20 (×19): qty 1

## 2021-04-20 MED ORDER — B COMPLEX-C PO TABS
1.0000 | ORAL_TABLET | Freq: Two times a day (BID) | ORAL | Status: DC
  Administered 2021-04-20 – 2021-05-09 (×38): 1 via ORAL
  Filled 2021-04-20 (×38): qty 1

## 2021-04-20 MED ORDER — PRAMIPEXOLE DIHYDROCHLORIDE 1.5 MG PO TABS
1.5000 mg | ORAL_TABLET | Freq: Every day | ORAL | Status: DC
  Administered 2021-04-20: 1.5 mg via ORAL
  Filled 2021-04-20: qty 1

## 2021-04-20 NOTE — Progress Notes (Signed)
Pt refused methotrexate. States was told to no longer take medication due to side effects.

## 2021-04-20 NOTE — Plan of Care (Signed)
°  Problem: Education: Goal: Knowledge of General Education information will improve Description: Including pain rating scale, medication(s)/side effects and non-pharmacologic comfort measures Outcome: Progressing   Problem: Health Behavior/Discharge Planning: Goal: Ability to manage health-related needs will improve Outcome: Not Progressing   Problem: Clinical Measurements: Goal: Ability to maintain clinical measurements within normal limits will improve Outcome: Not Progressing   Problem: Nutrition: Goal: Adequate nutrition will be maintained Outcome: Progressing

## 2021-04-20 NOTE — Progress Notes (Addendum)
Inpatient Rehabilitation Admission Medication Review by a Pharmacist  A complete drug regimen review was completed for this patient to identify any potential clinically significant medication issues.  High Risk Drug Classes Is patient taking? Indication by Medication  Antipsychotic Yes Compazine for N/V  Anticoagulant Yes Lovenox for DVT px  Antibiotic No   Opioid No   Antiplatelet No   Hypoglycemics/insulin No   Vasoactive Medication No   Chemotherapy No   Other Yes Vibegron/flomax - overactive bladder Gabapentin - neuropathy Robaxin - spasms Prednisone/methotrexate - RA Mirapex - parkinson's     Type of Medication Issue Identified Description of Issue Recommendation(s)  Drug Interaction(s) (clinically significant)     Duplicate Therapy     Allergy     No Medication Administration End Date     Incorrect Dose     Additional Drug Therapy Needed  Methotrexate, folic acid, vicodin, mirapex, weekly vit D, calcium carbonate Ok to resume all except Vicodin per Wendi Maya, PA  Significant med changes from prior encounter (inform family/care partners about these prior to discharge).    Other       Clinically significant medication issues were identified that warrant physician communication and completion of prescribed/recommended actions by midnight of the next day:  [facility]  Name of provider notified for urgent issues identified: Wendi Maya, PA  Provider Method of Notification: Secure chat    Pharmacist comments:   Time spent performing this drug regimen review (minutes):  [address], PharmD, [place], AAHIVP, CPP Infectious Disease Pharmacist 04/20/2021 3:30 PM

## 2021-04-20 NOTE — Discharge Summary (Signed)
PatientPhysician Discharge Summary  Tony Mcintosh [id number] DOB: [birth year] DOA: 04/11/2021  PCP: [facility]  Admit date: 04/11/2021 Discharge date: 04/20/2021 30 Day Unplanned Readmission Risk Score    Flowsheet Row ED to Hosp-Admission (Current) from 04/11/2021 in [place]  30 Day Unplanned Readmission Risk Score (%) 10.5 Filed at 04/20/2021 0801       This score is the patient's risk of an unplanned readmission within 30 days of being discharged (0 -100%). The score is based on dignosis, age, lab data, medications, orders, and past utilization.   Low:  0-14.9   Medium: 15-21.9   High: 22-29.9   Extreme: 30 and above          Admitted From: Home Disposition: CIR  Recommendations for Outpatient Follow-up:  Follow up with PCP in 1-2 weeks Please obtain BMP/CBC in one week Please follow up with your PCP on the following pending results: Unresulted Labs (From admission, onward)     Start     Ordered   04/13/21 0500  Vitamin E  Tomorrow morning,   R       Question:  Specimen collection method  Answer:  IV Team=IV Team collect   04/12/21 1211   04/12/21 1044  Draw extra clot tube  RELEASE UPON ORDERING,   STAT        04/12/21 1044   Signed and Held  Creatinine, serum  (enoxaparin (LOVENOX)    CrCl >/= 30 ml/min)  Once,   R       Comments: Baseline for enoxaparin therapy IF NOT ALREADY DRAWN.   Question:  Specimen collection method  Answer:  IV Team=IV Team collect   Signed and Held   Signed and Held  Comprehensive metabolic panel  Tomorrow morning,   R       Question:  Specimen collection method  Answer:  IV Team=IV Team collect   Signed and Held   Signed and Held  Basic metabolic panel  Weekly,   R     Question:  Specimen collection method  Answer:  IV Team=IV Team collect   Signed and Held   Signed and Held  CBC WITH DIFFERENTIAL  Tomorrow morning,   R       Question:  Specimen collection method  Answer:  IV  Team=IV Team collect   Signed and Held   Signed and Held  CBC  Weekly,   R     Question:  Specimen collection method  Answer:  IV Team=IV Team collect   Signed and Held   Signed and Held  VITAMIN D 25 Hydroxy (Vit-D Deficiency, Fractures)  Tomorrow morning,   R       Question:  Specimen collection method  Answer:  IV Team=IV Team collect   Signed and Held   Signed and Held  Hemoglobin A1c  Once,   R       Question:  Specimen collection method  Answer:  IV Team=IV Team collect   Signed and Held              Home Health: None Equipment/Devices: None  Discharge Condition: Stable CODE STATUS: Full code Diet recommendation: Cardiac  Subjective: Seen and examined.  Son at the bedside.  Patient states that his left knee pain is much improved since he had arthrocentesis yesterday but somehow he had small trauma in his left thumb last night and he has some pain.  He is happy to hear that he  does not have septic arthritis and neither gout or pseudogout based on the fluid analysis from arthrocentesis yesterday and he is happy to hear that insurance authorization has been obtained and he is being discharged to CIR today.  Brief/Interim Summary: Tony Mcintosh is an 76 y.o. male with medical history significant for lumbar spinal stenosis, peripheral polyneuropathy, chronic back pain, restless leg syndrome, BPH, who is admitted to [facility] on 04/11/2021 for further evaluation and management of presenting numbness/weakness involving the bilateral lower extremities. s/p LP.  S/p LP.  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  S/p 5 days of IVIG.  CIR recommended by PT. insurance authorization obtained, he is being discharged in stable condition today  Benign Prostatic Hyperplasia:   -on tamsulosin/vibegron as outpatient.    Obstructive sleep apnea:  -nocturnal CPAP.   Constipation: Resolved.   Norovirus infection/diarrhea: Patient had diarrhea a few days ago, symptom was collected on 04/16/2021 which  is positive for norovirus.  He is on contact isolation.  His diarrhea had resolved and instead he developed constipation which required Dulcolax suppository.   Left knee pain/joint effusion: Patient underwent arthrocentesis by Ortho on 04/19/2021 with removal of 65 cc fluid which is negative for any crystals but has 17,000 white cells but cultures so far negative.  Per Ortho note and my discussion with them, he does not have gout, pseudogout or septic arthritis but due to elevated white cells, Ortho plans to do another arthrocentesis in 1 week either in clinic or in CIR just to follow-up the white cells.  They will do pain injection today at lunch before patient discharges to CIR.  Left thumb pain: On examination, no swelling, edema but it is slightly tender on hyperextension.  Patient is able to flex left thumb.  Appears to have sprained his left thumb.  I do not think this is fracture however I offered x-ray to the patient since he remains concerned about everything however patient declined x-ray and he said he would like to monitor it for next 1 to 2 days.  Discharge plan was discussed with patient and his son at the bedside they verbalized understanding and agreed with it.  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   calcium carbonate 500 MG chewable tablet Commonly known as: TUMS - dosed in mg elemental calcium Chew 2 tablets by mouth daily.   folic acid 1 MG tablet Commonly known as: FOLVITE Take 1 mg by mouth daily.   gabapentin  300 MG capsule Commonly known as: NEURONTIN Take 300 mg by mouth at bedtime.   HYDROcodone-acetaminophen 5-325 MG tablet Commonly known as: NORCO/VICODIN Take 1 tablet by mouth every 6 (six) hours as needed for moderate pain.   methotrexate 2.5 MG tablet Commonly known as: RHEUMATREX Take 7 tablets by mouth once a week.   pramipexole 1 MG tablet Commonly known as: MIRAPEX Take 1 tablet by mouth 2 (two) times daily.   predniSONE 2.5 MG tablet Commonly known as: DELTASONE Take 1  tablet (2.5 mg total) by mouth daily for 15 days. What changed:  medication strength how much to take   tamsulosin 0.4 MG Caps capsule Commonly known as: FLOMAX Take 0.4 mg by mouth at bedtime.   Vibegron 75 MG Tabs Take 75 mg by mouth daily.   Vitamin D3 1.25 MG (50000 UT) Caps Take 1 capsule by mouth once a week.        [place] Follow up in 1 week(s).   Contact information: [place] [address] [phone]                No Known Allergies  Consultations: Neurology, orthopedics   Procedures/Studies: DG Knee 1-2 Views Left  Result Date: 04/19/2021 CLINICAL DATA:  LEFT knee pain, limited range of motion, no known injury EXAM: LEFT KNEE - 1-2 VIEW COMPARISON:  None FINDINGS: Osseous mineralization low normal. Scattered joint space narrowing and spur formation greatest at patellofemoral joint. No acute fracture, dislocation, or bone destruction. Trace joint effusion. 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 Electronically Signed   By: Lavonia Dana M.D.   On: 04/19/2021 14:12   CT HEAD WO CONTRAST  Result Date: 04/11/2021 CLINICAL DATA:  76 year old male with history of weakness and numbness. Paresthesia. EXAM: CT HEAD WITHOUT CONTRAST TECHNIQUE: Contiguous axial images were obtained from the base of the skull through the vertex without intravenous contrast. RADIATION  DOSE REDUCTION: This exam was performed according to the departmental dose-optimization program which includes automated exposure control, adjustment of the mA and/or kV according to patient size and/or use of iterative reconstruction technique. COMPARISON:  No priors. FINDINGS: Brain: Mild cerebral atrophy. 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 No evidence of acute infarction, hemorrhage, hydrocephalus, extra-axial collection or mass lesion/mass effect. Vascular: No hyperdense vessel or unexpected calcification. Skull: Normal. Negative for fracture or focal lesion. Sinuses/Orbits: No acute finding. Small mucosal retention cyst or polyp in the posterior aspect of the right maxillary sinus incidentally noted. Other: None. IMPRESSION: 1. No acute intracranial abnormalities. 2. Very mild cerebral atrophy and mild chronic microvascular ischemic changes are noted in the cerebral white matter, as above. Electronically Signed   By: Vinnie Langton M.D.   On: 04/11/2021 10:04   MR Cervical Spine Wo Contrast  Result Date: 04/11/2021 CLINICAL DATA:  Incontinence, numbness, weakness. Numbness in hands starting yesterday, no control of urine or bowel movements, unable to walk, no feeling in legs EXAM: MRI CERVICAL, THORACIC AND LUMBAR SPINE WITHOUT CONTRAST TECHNIQUE: Multiplanar and multiecho pulse sequences of the cervical spine, to include the craniocervical junction and cervicothoracic junction, and thoracic and lumbar spine, were obtained without intravenous contrast. COMPARISON:  None. FINDINGS: MRI CERVICAL SPINE FINDINGS Alignment: There is straightening of the normal cervical spine lordosis. There is no antero or retrolisthesis. Vertebrae: Vertebral body heights are preserved. There is no suspicious marrow signal abnormality. There is no marrow edema.  Cord: Normal in signal and morphology. Posterior Fossa, vertebral arteries, paraspinal tissues: The imaged posterior fossa is unremarkable. The vertebral artery flow voids are present. The paraspinal soft tissues are unremarkable. Disc levels: Evaluation of the neural foramina is suboptimal due to poor signal on the axial T2 images. There is multilevel disc desiccation and narrowing, most advanced at C4-C5 and C5-C6. There is multilevel facet arthropathy, overall left worse than  right. C2-C3: Mild uncovertebral and bilateral facet arthropathy result in mild left and no significant right neural foraminal stenosis without significant spinal canal stenosis C3-C4: There is a posterior disc osteophyte complex and uncovertebral and bilateral facet arthropathy resulting in severe left worse than right neural foraminal stenosis and mild spinal canal stenosis. C4-C5: There is a posterior disc osteophyte complex and uncovertebral and bilateral facet arthropathy resulting in severe left worse than right neural foraminal stenosis and mild to moderate spinal canal stenosis. C5-C6: There is a posterior disc osteophyte complex and uncovertebral and bilateral facet arthropathy resulting in severe bilateral neural foraminal stenosis and mild spinal canal stenosis. C6-C7: There is a posterior disc osteophyte complex and uncovertebral and bilateral facet arthropathy resulting in severe bilateral neural foraminal stenosis and mild-to-moderate spinal canal stenosis. C7-T1: There is a mild disc bulge resulting in mild right and no significant left neural foraminal stenosis without significant spinal canal stenosis. MRI THORACIC SPINE FINDINGS Alignment:  Normal. Vertebrae: Vertebral body heights are preserved. There is partial fusion of the T5 and T6 vertebral bodies. Cord:  Normal in signal and morphology. Paraspinal and other soft tissues: Unremarkable. Disc levels: There is multilevel disc desiccation and narrowing throughout the  thoracic spine. There are bulky flowing anterior endplate osteophytes throughout the mid and lower thoracic spine consistent with diffuse idiopathic skeletal hyperostosis. T2-T3: There is a mild disc protrusion without significant spinal canal or neural foraminal stenosis. T3-T4: There is a small right paracentral disc protrusion without significant spinal canal or neural foraminal stenosis. T4-T5: There is a small left paracentral disc protrusion without significant spinal canal or neural foraminal stenosis. T6-T7: There is a small central protrusion resulting in mild spinal canal narrowing without significant neural foraminal stenosis. T7-T8: There is a small left paracentral disc protrusion without significant spinal canal or neural foraminal stenosis. T8-T9: There is a prominent central protrusion resulting in mild spinal canal stenosis with effacement of the ventral thecal sac but no mass effect on the cord, and no significant neural foraminal stenosis. T9-T10: There is a small central protrusion without significant spinal canal or neural foraminal stenosis. T10-T11: There is a large disc protrusion/extrusion and bilateral facet arthropathy resulting in moderate spinal canal stenosis with effacement of the ventral thecal sac and mild indentation of the cord and mild bilateral neural foraminal stenosis. T11-T12: There is a prominent central protrusion resulting in mild spinal canal stenosis with effacement of the ventral thecal sac without significant neural foraminal stenosis. T12-L1: There is a prominent central protrusion/extrusion resulting in mild-to-moderate spinal canal stenosis without significant neural foraminal stenosis. MRI LUMBAR SPINE FINDINGS Segmentation:  Standard. Alignment: There is dextroscoliosis of the lumbar spine centered at L2-L3. There is grade 1 retrolisthesis of L1 on L2, L2 on L3, L3 on L4, and L4 on L5, all likely degenerative in nature. Vertebrae: There is fusion of the L1 through  L3 vertebral bodies. Marrow signal is mildly heterogeneous with degenerative signal abnormality at L1 through L3 and about the L4-L5 disc space. There is no suspicious marrow signal abnormality. Postsurgical changes are noted reflecting posterior decompression at L1-L2 through L3-L4. Conus medullaris and cauda equina: Conus extends to the mid L1 level. Conus and cauda equina appear normal. Paraspinal and other soft tissues: There is atrophy of the right psoas muscle. Disc levels: There is obliteration of the disc spaces at L1-L2 and L2-L3. There is advanced disc space narrowing and desiccation throughout the remainder of the lumbar spine. There is severe facet arthropathy at L4-L5 and L5-S1,  slightly worse on the right. There are associated trace bilateral facet joint effusions. L1-L2: Status post posterior decompression. There is a broad-based central disc protrusion, degenerative endplate change, and bilateral facet arthropathy resulting in mild spinal canal stenosis with effacement of the left subarticular zone and suspected impingement of the traversing left L2 nerve root without significant neural foraminal stenosis. L2-L3: Status post posterior decompression. There is degenerative endplate change and bilateral facet arthropathy resulting in mild left and no significant right neural foraminal stenosis and no significant spinal canal stenosis. L3-L4: Status post posterior decompression. There is a mild disc bulge, degenerative endplate change, and bilateral facet arthropathy resulting in mild narrowing of the subarticular zones, left worse than right, without evidence of frank nerve root impingement, and mild right worse than left neural foraminal stenosis. L4-L5: There is prominent endplate spurring and bulky bilateral facet arthropathy resulting in moderate to severe spinal canal stenosis with effacement of the subarticular zones and possible impingement of the traversing L5 nerve roots, and moderate left and  severe right neural foraminal stenosis with impingement of the exiting right L4 nerve root. L5-S1: There is a diffuse disc bulge, degenerative endplate change, and bilateral facet arthropathy resulting in severe left worse than right with impingement of the exiting L5 nerve roots. IMPRESSION: CERVICAL SPINE MRI: 1. Evaluation of the neural foramina is suboptimal due to poor signal on the axial T2 images. Within this confine: 2. Multilevel degenerative changes throughout the cervical spine as detailed above resulting in up to mild-to-moderate spinal canal stenosis at C4-C5 and C6-C7 and severe bilateral neural foraminal stenosis at C3-C4 through C6-C7. 3. No evidence of cord compression or cord signal abnormality. THORACIC SPINE MRI: 1. Large disc protrusion/extrusion at T10-T11 resulting in moderate spinal canal stenosis with mild indentation of the cord and mild bilateral neural foraminal stenosis. 2. Numerous additional disc protrusions throughout the remainder of the thoracic spine as detailed above without other high-grade spinal canal stenosis. No high-grade neural foraminal stenosis or evidence of nerve root impingement in the thoracic spine. 3. 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 4. No cord signal abnormality. LUMBAR SPINE MRI: 1. Postsurgical changes reflecting posterior decompression at L1-L2 through L3-L4. No high-grade spinal canal stenosis at the surgical levels, but there is effacement of the left subarticular zone at L1-L2 with possible impingement of the traversing left L2 nerve root. 2. 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 3. Severe left worse than right neural foraminal stenosis at L5-S1 with impingement of the exiting L5 nerve  roots. 4. Stepwise grade 1 retrolisthesis of L1 on L2 through L4 on L5, likely degenerative in nature. 5. Fusion of the L1 through L3 vertebral bodies. 6. Severe facet arthropathy at L4-L5 and L5-S1, slightly worse on the right, with associated trace facet joint effusions. Electronically Signed   By: Valetta Mole M.D.   On: 04/11/2021 17:37   MR THORACIC SPINE WO CONTRAST  Result Date: 04/11/2021 CLINICAL DATA:  Incontinence, numbness, weakness. Numbness in hands starting yesterday, no control of urine or bowel movements, unable to walk, no feeling in legs EXAM: MRI CERVICAL, THORACIC AND LUMBAR SPINE WITHOUT CONTRAST TECHNIQUE: Multiplanar and multiecho pulse sequences of the cervical spine, to include the craniocervical junction and cervicothoracic junction, and thoracic and lumbar spine, were obtained without intravenous  contrast. COMPARISON:  None. FINDINGS: MRI CERVICAL SPINE FINDINGS Alignment: There is straightening of the normal cervical spine lordosis. There is no antero or retrolisthesis. Vertebrae: Vertebral body heights are preserved. There is no suspicious marrow signal abnormality. There is no marrow edema. Cord: Normal in signal and morphology. Posterior Fossa, vertebral arteries, paraspinal tissues: The imaged posterior fossa is unremarkable. The vertebral artery flow voids are present. The paraspinal soft tissues are unremarkable. Disc levels: Evaluation of the neural foramina is suboptimal due to poor signal on the axial T2 images. There is multilevel disc desiccation and narrowing, most advanced at C4-C5 and C5-C6. There is multilevel facet arthropathy, overall left worse than right. C2-C3: Mild uncovertebral and bilateral facet arthropathy result in mild left and no significant right neural foraminal stenosis without significant spinal canal stenosis C3-C4: There is a posterior disc osteophyte complex and uncovertebral and bilateral facet arthropathy resulting in severe left worse than right  neural foraminal stenosis and mild spinal canal stenosis. C4-C5: There is a posterior disc osteophyte complex and uncovertebral and bilateral facet arthropathy resulting in severe left worse than right neural foraminal stenosis and mild to moderate spinal canal stenosis. C5-C6: There is a posterior disc osteophyte complex and uncovertebral and bilateral facet arthropathy resulting in severe bilateral neural foraminal stenosis and mild spinal canal stenosis. C6-C7: There is a posterior disc osteophyte complex and uncovertebral and bilateral facet arthropathy resulting in severe bilateral neural foraminal stenosis and mild-to-moderate spinal canal stenosis. C7-T1: There is a mild disc bulge resulting in mild right and no significant left neural foraminal stenosis without significant spinal canal stenosis. MRI THORACIC SPINE FINDINGS Alignment:  Normal. Vertebrae: Vertebral body heights are preserved. There is partial fusion of the T5 and T6 vertebral bodies. Cord:  Normal in signal and morphology. Paraspinal and other soft tissues: Unremarkable. Disc levels: There is multilevel disc desiccation and narrowing throughout the thoracic spine. There are bulky flowing anterior endplate osteophytes throughout the mid and lower thoracic spine consistent with diffuse idiopathic skeletal hyperostosis. T2-T3: There is a mild disc protrusion without significant spinal canal or neural foraminal stenosis. T3-T4: There is a small right paracentral disc protrusion without significant spinal canal or neural foraminal stenosis. T4-T5: There is a small left paracentral disc protrusion without significant spinal canal or neural foraminal stenosis. T6-T7: There is a small central protrusion resulting in mild spinal canal narrowing without significant neural foraminal stenosis. T7-T8: There is a small left paracentral disc protrusion without significant spinal canal or neural foraminal stenosis. T8-T9: There is a prominent central  protrusion resulting in mild spinal canal stenosis with effacement of the ventral thecal sac but no mass effect on the cord, and no significant neural foraminal stenosis. T9-T10: There is a small central protrusion without significant spinal canal or neural foraminal stenosis. T10-T11: There is a large disc protrusion/extrusion and bilateral facet arthropathy resulting in moderate spinal canal stenosis with effacement of the ventral thecal sac and mild indentation of the cord and mild bilateral neural foraminal stenosis. T11-T12: There is a prominent central protrusion resulting in mild spinal canal stenosis with effacement of the ventral thecal sac without significant neural foraminal stenosis. T12-L1: There is a prominent central protrusion/extrusion resulting in mild-to-moderate spinal canal stenosis without significant neural foraminal stenosis. MRI LUMBAR SPINE FINDINGS Segmentation:  Standard. Alignment: There is dextroscoliosis of the lumbar spine centered at L2-L3. There is grade 1 retrolisthesis of L1 on L2, L2 on L3, L3 on L4, and L4 on L5, all likely degenerative in nature. Vertebrae: There  is fusion of the L1 through L3 vertebral bodies. Marrow signal is mildly heterogeneous with degenerative signal abnormality at L1 through L3 and about the L4-L5 disc space. There is no suspicious marrow signal abnormality. Postsurgical changes are noted reflecting posterior decompression at L1-L2 through L3-L4. Conus medullaris and cauda equina: Conus extends to the mid L1 level. Conus and cauda equina appear normal. Paraspinal and other soft tissues: There is atrophy of the right psoas muscle. Disc levels: There is obliteration of the disc spaces at L1-L2 and L2-L3. There is advanced disc space narrowing and desiccation throughout the remainder of the lumbar spine. There is severe facet arthropathy at L4-L5 and L5-S1, slightly worse on the right. There are associated trace bilateral facet joint effusions. L1-L2:  Status post posterior decompression. There is a broad-based central disc protrusion, degenerative endplate change, and bilateral facet arthropathy resulting in mild spinal canal stenosis with effacement of the left subarticular zone and suspected impingement of the traversing left L2 nerve root without significant neural foraminal stenosis. L2-L3: Status post posterior decompression. There is degenerative endplate change and bilateral facet arthropathy resulting in mild left and no significant right neural foraminal stenosis and no significant spinal canal stenosis. L3-L4: Status post posterior decompression. There is a mild disc bulge, degenerative endplate change, and bilateral facet arthropathy resulting in mild narrowing of the subarticular zones, left worse than right, without evidence of frank nerve root impingement, and mild right worse than left neural foraminal stenosis. L4-L5: There is prominent endplate spurring and bulky bilateral facet arthropathy resulting in moderate to severe spinal canal stenosis with effacement of the subarticular zones and possible impingement of the traversing L5 nerve roots, and moderate left and severe right neural foraminal stenosis with impingement of the exiting right L4 nerve root. L5-S1: There is a diffuse disc bulge, degenerative endplate change, and bilateral facet arthropathy resulting in severe left worse than right with impingement of the exiting L5 nerve roots. IMPRESSION: CERVICAL SPINE MRI: 1. Evaluation of the neural foramina is suboptimal due to poor signal on the axial T2 images. Within this confine: 2. Multilevel degenerative changes throughout the cervical spine as detailed above resulting in up to mild-to-moderate spinal canal stenosis at C4-C5 and C6-C7 and severe bilateral neural foraminal stenosis at C3-C4 through C6-C7. 3. No evidence of cord compression or cord signal abnormality. THORACIC SPINE MRI: 1. Large disc protrusion/extrusion at T10-T11  resulting in moderate spinal canal stenosis with mild indentation of the cord and mild bilateral neural foraminal stenosis. 2. Numerous additional disc protrusions throughout the remainder of the thoracic spine as detailed above without other high-grade spinal canal stenosis. No high-grade neural foraminal stenosis or evidence of nerve root impingement in the thoracic spine. 3. 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 4. No cord signal abnormality. LUMBAR SPINE MRI: 1. Postsurgical changes reflecting posterior decompression at L1-L2 through L3-L4. No high-grade spinal canal stenosis at the surgical levels, but there is effacement of the left subarticular zone at L1-L2 with possible impingement of the traversing left L2 nerve root. 2. 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 3. Severe left worse than right neural foraminal stenosis at L5-S1 with impingement of the exiting L5 nerve roots. 4. Stepwise grade 1 retrolisthesis of L1 on L2 through L4 on L5, likely degenerative in nature.  5. Fusion of the L1 through L3 vertebral bodies. 6. Severe facet arthropathy at L4-L5 and L5-S1, slightly worse on the right, with associated trace facet joint effusions. Electronically Signed   By: Valetta Mole M.D.   On: 04/11/2021 17:37   MR LUMBAR SPINE WO CONTRAST  Result Date: 04/11/2021 CLINICAL DATA:  Incontinence, numbness, weakness. Numbness in hands starting yesterday, no control of urine or bowel movements, unable to walk, no feeling in legs EXAM: MRI CERVICAL, THORACIC AND LUMBAR SPINE WITHOUT CONTRAST TECHNIQUE: Multiplanar and multiecho pulse sequences of the cervical spine, to include the craniocervical junction and cervicothoracic junction, and thoracic and  lumbar spine, were obtained without intravenous contrast. COMPARISON:  None. FINDINGS: MRI CERVICAL SPINE FINDINGS Alignment: There is straightening of the normal cervical spine lordosis. There is no antero or retrolisthesis. Vertebrae: Vertebral body heights are preserved. There is no suspicious marrow signal abnormality. There is no marrow edema. Cord: Normal in signal and morphology. Posterior Fossa, vertebral arteries, paraspinal tissues: The imaged posterior fossa is unremarkable. The vertebral artery flow voids are present. The paraspinal soft tissues are unremarkable. Disc levels: Evaluation of the neural foramina is suboptimal due to poor signal on the axial T2 images. There is multilevel disc desiccation and narrowing, most advanced at C4-C5 and C5-C6. There is multilevel facet arthropathy, overall left worse than right. C2-C3: Mild uncovertebral and bilateral facet arthropathy result in mild left and no significant right neural foraminal stenosis without significant spinal canal stenosis C3-C4: There is a posterior disc osteophyte complex and uncovertebral and bilateral facet arthropathy resulting in severe left worse than right neural foraminal stenosis and mild spinal canal stenosis. C4-C5: There is a posterior disc osteophyte complex and uncovertebral and bilateral facet arthropathy resulting in severe left worse than right neural foraminal stenosis and mild to moderate spinal canal stenosis. C5-C6: There is a posterior disc osteophyte complex and uncovertebral and bilateral facet arthropathy resulting in severe bilateral neural foraminal stenosis and mild spinal canal stenosis. C6-C7: There is a posterior disc osteophyte complex and uncovertebral and bilateral facet arthropathy resulting in severe bilateral neural foraminal stenosis and mild-to-moderate spinal canal stenosis. C7-T1: There is a mild disc bulge resulting in mild right and no significant left neural foraminal stenosis without significant  spinal canal stenosis. MRI THORACIC SPINE FINDINGS Alignment:  Normal. Vertebrae: Vertebral body heights are preserved. There is partial fusion of the T5 and T6 vertebral bodies. Cord:  Normal in signal and morphology. Paraspinal and other soft tissues: Unremarkable. Disc levels: There is multilevel disc desiccation and narrowing throughout the thoracic spine. There are bulky flowing anterior endplate osteophytes throughout the mid and lower thoracic spine consistent with diffuse idiopathic skeletal hyperostosis. T2-T3: There is a mild disc protrusion without significant spinal canal or neural foraminal stenosis. T3-T4: There is a small right paracentral disc protrusion without significant spinal canal or neural foraminal stenosis. T4-T5: There is a small left paracentral disc protrusion without significant spinal canal or neural foraminal stenosis. T6-T7: There is a small central protrusion resulting in mild spinal canal narrowing without significant neural foraminal stenosis. T7-T8: There is a small left paracentral disc protrusion without significant spinal canal or neural foraminal stenosis. T8-T9: There is a prominent central protrusion resulting in mild spinal canal stenosis with effacement of the ventral thecal sac but no mass effect on the cord, and no significant neural foraminal stenosis. T9-T10: There is a small central protrusion without significant spinal canal or neural foraminal stenosis. T10-T11: There is a large disc protrusion/extrusion and bilateral  facet arthropathy resulting in moderate spinal canal stenosis with effacement of the ventral thecal sac and mild indentation of the cord and mild bilateral neural foraminal stenosis. T11-T12: There is a prominent central protrusion resulting in mild spinal canal stenosis with effacement of the ventral thecal sac without significant neural foraminal stenosis. T12-L1: There is a prominent central protrusion/extrusion resulting in mild-to-moderate spinal  canal stenosis without significant neural foraminal stenosis. MRI LUMBAR SPINE FINDINGS Segmentation:  Standard. Alignment: There is dextroscoliosis of the lumbar spine centered at L2-L3. There is grade 1 retrolisthesis of L1 on L2, L2 on L3, L3 on L4, and L4 on L5, all likely degenerative in nature. Vertebrae: There is fusion of the L1 through L3 vertebral bodies. Marrow signal is mildly heterogeneous with degenerative signal abnormality at L1 through L3 and about the L4-L5 disc space. There is no suspicious marrow signal abnormality. Postsurgical changes are noted reflecting posterior decompression at L1-L2 through L3-L4. Conus medullaris and cauda equina: Conus extends to the mid L1 level. Conus and cauda equina appear normal. Paraspinal and other soft tissues: There is atrophy of the right psoas muscle. Disc levels: There is obliteration of the disc spaces at L1-L2 and L2-L3. There is advanced disc space narrowing and desiccation throughout the remainder of the lumbar spine. There is severe facet arthropathy at L4-L5 and L5-S1, slightly worse on the right. There are associated trace bilateral facet joint effusions. L1-L2: Status post posterior decompression. There is a broad-based central disc protrusion, degenerative endplate change, and bilateral facet arthropathy resulting in mild spinal canal stenosis with effacement of the left subarticular zone and suspected impingement of the traversing left L2 nerve root without significant neural foraminal stenosis. L2-L3: Status post posterior decompression. There is degenerative endplate change and bilateral facet arthropathy resulting in mild left and no significant right neural foraminal stenosis and no significant spinal canal stenosis. L3-L4: Status post posterior decompression. There is a mild disc bulge, degenerative endplate change, and bilateral facet arthropathy resulting in mild narrowing of the subarticular zones, left worse than right, without evidence of  frank nerve root impingement, and mild right worse than left neural foraminal stenosis. L4-L5: There is prominent endplate spurring and bulky bilateral facet arthropathy resulting in moderate to severe spinal canal stenosis with effacement of the subarticular zones and possible impingement of the traversing L5 nerve roots, and moderate left and severe right neural foraminal stenosis with impingement of the exiting right L4 nerve root. L5-S1: There is a diffuse disc bulge, degenerative endplate change, and bilateral facet arthropathy resulting in severe left worse than right with impingement of the exiting L5 nerve roots. IMPRESSION: CERVICAL SPINE MRI: 1. Evaluation of the neural foramina is suboptimal due to poor signal on the axial T2 images. Within this confine: 2. Multilevel degenerative changes throughout the cervical spine as detailed above resulting in up to mild-to-moderate spinal canal stenosis at C4-C5 and C6-C7 and severe bilateral neural foraminal stenosis at C3-C4 through C6-C7. 3. No evidence of cord compression or cord signal abnormality. THORACIC SPINE MRI: 1. Large disc protrusion/extrusion at T10-T11 resulting in moderate spinal canal stenosis with mild indentation of the cord and mild bilateral neural foraminal stenosis. 2. Numerous additional disc protrusions throughout the remainder of the thoracic spine as detailed above without other high-grade spinal canal stenosis. No high-grade neural foraminal stenosis or evidence of nerve root impingement in the thoracic spine. 3. 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 4. No cord signal abnormality.  LUMBAR SPINE MRI: 1. Postsurgical changes reflecting posterior decompression at L1-L2 through L3-L4. No high-grade spinal canal stenosis at the surgical levels, but there is effacement of the left subarticular zone at L1-L2 with possible impingement of the traversing left L2  nerve root. 2. 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 3. Severe left worse than right neural foraminal stenosis at L5-S1 with impingement of the exiting L5 nerve roots. 4. Stepwise grade 1 retrolisthesis of L1 on L2 through L4 on L5, likely degenerative in nature. 5. Fusion of the L1 through L3 vertebral bodies. 6. Severe facet arthropathy at L4-L5 and L5-S1, slightly worse on the right, with associated trace facet joint effusions. Electronically Signed   By: Valetta Mole M.D.   On: 04/11/2021 17:37   VAS [place] ABI WITH/WO TBI  Result Date: 04/17/2021  LOWER EXTREMITY DOPPLER STUDY Patient Name:  Tony Mcintosh  Date of Exam:   04/17/2021 Medical Rec #: [id number]           Accession #:    [id number] Date of Birth: November 07, 1945           Patient Gender: M Patient Age:   28 years Exam Location:  [facility] Procedure:      VAS [place] ABI WITH/WO TBI Referring Phys: Eulogio Bear --------------------------------------------------------------------------------  Indications: Claudication.  Comparison Study: no prior Performing Technologist: Archie Patten RVS  Examination Guidelines: A complete evaluation includes at minimum, Doppler waveform signals and systolic blood pressure reading at the level of bilateral brachial, anterior tibial, and posterior tibial arteries, when vessel segments are accessible. Bilateral testing is considered an integral part of a complete examination. Photoelectric Plethysmograph (PPG) waveforms and toe systolic pressure readings are included as required and additional duplex testing as needed. Limited examinations for reoccurring indications may be performed as noted.  ABI Findings: +---------+------------------+-----+---------+--------+  Right     Rt Pressure (mmHg) Index Waveform  Comment    +---------+------------------+-----+---------+--------+  Brachial  160                      triphasic           +---------+------------------+-----+---------+--------+  PTA       183                1.14  triphasic           +---------+------------------+-----+---------+--------+  DP        207                1.29  triphasic           +---------+------------------+-----+---------+--------+  Great Toe 123                0.77  Normal              +---------+------------------+-----+---------+--------+ +---------+------------------+-----+---------+-------+  Left      Lt Pressure (mmHg) Index Waveform  Comment  +---------+------------------+-----+---------+-------+  Brachial  147                      triphasic          +---------+------------------+-----+---------+-------+  PTA       204                1.27  triphasic          +---------+------------------+-----+---------+-------+  DP        196                1.23  triphasic          +---------+------------------+-----+---------+-------+  Great Toe 123                0.77  Normal             +---------+------------------+-----+---------+-------+ +-------+-----------+-----------+------------+------------+  ABI/TBI Today's ABI Today's TBI Previous ABI Previous TBI  +-------+-----------+-----------+------------+------------+  Right   1.29        0.77                                   +-------+-----------+-----------+------------+------------+  Left    1.27        0.77                                   +-------+-----------+-----------+------------+------------+  Summary: Right: Resting right ankle-brachial index is within normal range. No evidence of significant right lower extremity arterial disease. The right toe-brachial index is normal. Left: Resting left ankle-brachial index is within normal range. No evidence of significant left lower extremity arterial disease. The left toe-brachial index is normal.  *See table(s) above for measurements and observations.   Electronically signed by Deitra Mayo MD on 04/17/2021 at 3:23:07 PM.    Final    DG FL GUIDED LUMBAR PUNCTURE  Result Date: 04/12/2021 CLINICAL DATA:  Numbness of both lower extremities. EXAM: DIAGNOSTIC LUMBAR PUNCTURE UNDER FLUOROSCOPIC GUIDANCE COMPARISON:  MRI of the lumbar spine 04/11/2021. FLUOROSCOPY TIME:  Fluoroscopy Time:  36 seconds Radiation Exposure Index (if provided by the fluoroscopic device): 18.40 mGy Number of Acquired Spot Images: 1 PROCEDURE: Informed consent was obtained from the patient prior to the procedure, which included a discussion of potential procedure risks including but not limited to headache, allergic reaction, pain, bleeding, infection and spinal cord/nerve root injury. With the patient prone, the lower back was prepped with Betadine. 1% Lidocaine was used for local anesthesia. Lumbar puncture was performed via the L3 laminectomy defect using a 20 gauge needle with return of clear CSF with an opening pressure of 12.5 cm water. 12 ml of CSF were obtained for laboratory studies. The patient tolerated the procedure well. No immediate post-procedure complication was apparent. IMPRESSION: Technically successful fluoroscopically-guided lumbar puncture via an L3 laminectomy defect. 12 mL CSF obtained for laboratory studies. No immediate post-procedure complication. Electronically Signed   By: Kellie Simmering D.O.   On: 04/12/2021 10:59     Discharge Exam: Vitals:   04/20/21 0406 04/20/21 0807  BP: (!) 145/97 (!) 149/77  Pulse: 73 67  Resp: 18 19  Temp: 99.5 F (37.5 C) 97.8 F (36.6 C)  SpO2: 97% 94%   Vitals:   04/19/21 1548 04/19/21 2032 04/20/21 0406 04/20/21 0807  BP: (!) 150/99 (!) 151/81 (!) 145/97 (!) 149/77  Pulse: 64 74 73 67  Resp: 20  18 19   Temp: 98.4 F (36.9 C) 97.8 F (36.6 C) 99.5 F (37.5 C) 97.8 F (36.6 C)  TempSrc: Oral Oral Oral   SpO2: 98% 96% 97% 94%  Weight:   (!) 136.5 kg   Height:        General: Pt is alert, awake, not in  acute distress Cardiovascular: RRR, S1/S2 +, no rubs, no gallops Respiratory: CTA bilaterally, no  wheezing, no rhonchi Abdominal: Soft, NT, ND, bowel sounds + Extremities: Trace pitting edema bilateral lower extremity, there is still some effusion on the left knee based on my examination but it is nontender.    The results of significant diagnostics from this hospitalization (including imaging, microbiology, ancillary and laboratory) are listed below for reference.     Microbiology: Recent Results (from the past 240 hour(s))  Resp Panel by RT-PCR (Flu A&B, Covid) Nasopharyngeal Swab     Status: None   Collection Time: 04/11/21  8:04 PM   Specimen: Nasopharyngeal Swab; Nasopharyngeal(NP) swabs in vial transport medium  Result Value Ref Range Status   SARS Coronavirus 2 by RT PCR NEGATIVE NEGATIVE Final    Comment: (NOTE) SARS-CoV-2 target nucleic acids are NOT DETECTED.  The SARS-CoV-2 RNA is generally detectable in upper respiratory specimens during the acute phase of infection. The lowest concentration of SARS-CoV-2 viral copies this assay can detect is 138 copies/mL. A negative result does not preclude SARS-Cov-2 infection and should not be used as the sole basis for treatment or other patient management decisions. A negative result may occur with  improper specimen collection/handling, submission of specimen other than nasopharyngeal swab, presence of viral mutation(s) within the areas targeted by this assay, and inadequate number of viral copies(<138 copies/mL). A negative result must be combined with clinical observations, patient history, and epidemiological information. The expected result is Negative.  Fact Sheet for Patients:  EntrepreneurPulse.com.au  Fact Sheet for Healthcare Providers:  IncredibleEmployment.be  This test is no t yet approved or cleared by the [place] FDA and  has been authorized for detection and/or  diagnosis of SARS-CoV-2 by FDA under an Emergency Use Authorization (EUA). This EUA will remain  in effect (meaning this test can be used) for the duration of the COVID-19 declaration under Section 564(b)(1) of the Act, 21 U.S.C.section 360bbb-3(b)(1), unless the authorization is terminated  or revoked sooner.       Influenza A by PCR NEGATIVE NEGATIVE Final   Influenza B by PCR NEGATIVE NEGATIVE Final    Comment: (NOTE) The Xpert Xpress SARS-CoV-2/FLU/RSV plus assay is intended as an aid in the diagnosis of influenza from Nasopharyngeal swab specimens and should not be used as a sole basis for treatment. Nasal washings and aspirates are unacceptable for Xpert Xpress SARS-CoV-2/FLU/RSV testing.  Fact Sheet for Patients: EntrepreneurPulse.com.au  Fact Sheet for Healthcare Providers: IncredibleEmployment.be  This test is not yet approved or cleared by the [place] FDA and has been authorized for detection and/or diagnosis of SARS-CoV-2 by FDA under an Emergency Use Authorization (EUA). This EUA will remain in effect (meaning this test can be used) for the duration of the COVID-19 declaration under Section 564(b)(1) of the Act, 21 U.S.C. section 360bbb-3(b)(1), unless the authorization is terminated or revoked.  Performed at [facility], [place] [address] [place], [place] [address]   Gastrointestinal Panel by PCR , Stool     Status: Abnormal   Collection Time: 04/16/21 10:29 AM   Specimen: Stool  Result Value Ref Range Status   Campylobacter species NOT DETECTED NOT DETECTED Final   Plesimonas shigelloides NOT DETECTED NOT DETECTED Final   Salmonella species NOT DETECTED NOT DETECTED Final   Yersinia enterocolitica NOT DETECTED NOT DETECTED Final   Vibrio species NOT DETECTED NOT DETECTED Final   Vibrio cholerae NOT DETECTED NOT DETECTED Final   Enteroaggregative E coli (EAEC) NOT DETECTED NOT DETECTED Final   Enteropathogenic E  coli (EPEC) NOT DETECTED NOT DETECTED  Final   Enterotoxigenic E coli (ETEC) NOT DETECTED NOT DETECTED Final   Shiga like toxin producing E coli (STEC) NOT DETECTED NOT DETECTED Final   Shigella/Enteroinvasive E coli (EIEC) NOT DETECTED NOT DETECTED Final   Cryptosporidium NOT DETECTED NOT DETECTED Final   Cyclospora cayetanensis NOT DETECTED NOT DETECTED Final   Entamoeba histolytica NOT DETECTED NOT DETECTED Final   Giardia lamblia NOT DETECTED NOT DETECTED Final   Adenovirus F40/41 NOT DETECTED NOT DETECTED Final   Astrovirus NOT DETECTED NOT DETECTED Final   Norovirus GI/GII DETECTED (A) NOT DETECTED Final    Comment: RESULT CALLED TO, READ BACK BY AND VERIFIED WITH: Wonda Horner RN [id number] 04/18/21 HNM    Rotavirus A NOT DETECTED NOT DETECTED Final   Sapovirus (I, II, IV, and V) NOT DETECTED NOT DETECTED Final    Comment: Performed at [facility], [place] [address] [place], [place] [address]  Body fluid culture w Gram Stain     Status: None (Preliminary result)   Collection Time: 04/19/21  7:52 PM   Specimen: Synovium; Synovial Fluid  Result Value Ref Range Status   Specimen Description SYNOVIAL KNEE JOINT FLUID  Final   Special Requests Immunocompromised  Final   Gram Stain   Final    MODERATE WBC PRESENT, PREDOMINANTLY PMN NO ORGANISMS SEEN    Culture   Final    NO GROWTH < 12 HOURS Performed at [facility], [address], [place], [place] [address]    Report Status PENDING  Incomplete  Anaerobic culture w Gram Stain     Status: None (Preliminary result)   Collection Time: 04/19/21  7:52 PM   Specimen: Synovium; Synovial Fluid  Result Value Ref Range Status   Specimen Description SYNOVIAL KNEE JOINT FLUID  Final   Special Requests Immunocompromised  Final   Gram Stain   Final    MODERATE WBC PRESENT, PREDOMINANTLY PMN NO ORGANISMS SEEN Performed at [facility], [address], [place], [place] [address]    Culture PENDING  Incomplete   Report  Status PENDING  Incomplete     Labs: BNP (last 3 results) No results for input(s): BNP in the last 8760 hours. Basic Metabolic Panel: Recent Labs  Lab 04/17/21 0553  NA 132*  K 3.8  CL 100  CO2 26  GLUCOSE 104*  BUN 16  CREATININE 0.95  CALCIUM 8.8*   Liver Function Tests: No results for input(s): AST, ALT, ALKPHOS, BILITOT, PROT, ALBUMIN in the last 168 hours. No results for input(s): LIPASE, AMYLASE in the last 168 hours. No results for input(s): AMMONIA in the last 168 hours. CBC: Recent Labs  Lab 04/17/21 0553  WBC 5.0  HGB 14.4  HCT 40.9  MCV 93.4  PLT 143*   Cardiac Enzymes: No results for input(s): CKTOTAL, CKMB, CKMBINDEX, TROPONINI in the last 168 hours. BNP: Invalid input(s): POCBNP CBG: No results for input(s): GLUCAP in the last 168 hours. D-Dimer No results for input(s): DDIMER in the last 72 hours. Hgb A1c No results for input(s): HGBA1C in the last 72 hours. Lipid Profile No results for input(s): CHOL, HDL, LDLCALC, TRIG, CHOLHDL, LDLDIRECT in the last 72 hours. Thyroid function studies No results for input(s): TSH, T4TOTAL, T3FREE, THYROIDAB in the last 72 hours.  Invalid input(s): FREET3 Anemia work up No results for input(s): VITAMINB12, FOLATE, FERRITIN, TIBC, IRON, RETICCTPCT in the last 72 hours. Urinalysis    Component Value Date/Time   COLORURINE YELLOW 04/11/2021 1422   APPEARANCEUR  CLEAR 04/11/2021 1422   LABSPEC 1.015 04/11/2021 1422   PHURINE 6.5 04/11/2021 1422   GLUCOSEU NEGATIVE 04/11/2021 1422   HGBUR NEGATIVE 04/11/2021 1422   BILIRUBINUR NEGATIVE 04/11/2021 1422   KETONESUR NEGATIVE 04/11/2021 1422   PROTEINUR NEGATIVE 04/11/2021 1422   NITRITE NEGATIVE 04/11/2021 1422   LEUKOCYTESUR NEGATIVE 04/11/2021 1422   [facility] Invalid input(s): PROCALCITONIN,  WBC,  LACTICIDVEN Microbiology Recent Results (from the past 240 hour(s))  Resp Panel by RT-PCR (Flu A&B, Covid) Nasopharyngeal Swab     Status: None    Collection Time: 04/11/21  8:04 PM   Specimen: Nasopharyngeal Swab; Nasopharyngeal(NP) swabs in vial transport medium  Result Value Ref Range Status   SARS Coronavirus 2 by RT PCR NEGATIVE NEGATIVE Final    Comment: (NOTE) SARS-CoV-2 target nucleic acids are NOT DETECTED.  The SARS-CoV-2 RNA is generally detectable in upper respiratory specimens during the acute phase of infection. The lowest concentration of SARS-CoV-2 viral copies this assay can detect is 138 copies/mL. A negative result does not preclude SARS-Cov-2 infection and should not be used as the sole basis for treatment or other patient management decisions. A negative result may occur with  improper specimen collection/handling, submission of specimen other than nasopharyngeal swab, presence of viral mutation(s) within the areas targeted by this assay, and inadequate number of viral copies(<138 copies/mL). A negative result must be combined with clinical observations, patient history, and epidemiological information. The expected result is Negative.  Fact Sheet for Patients:  BloggerCourse.comhttps://www.fda.gov/media/152166/download  Fact Sheet for Healthcare Providers:  SeriousBroker.ithttps://www.fda.gov/media/152162/download  This test is no t yet approved or cleared by the [place] FDA and  has been authorized for detection and/or diagnosis of SARS-CoV-2 by FDA under an Emergency Use Authorization (EUA). This EUA will remain  in effect (meaning this test can be used) for the duration of the COVID-19 declaration under Section 564(b)(1) of the Act, 21 U.S.C.section 360bbb-3(b)(1), unless the authorization is terminated  or revoked sooner.       Influenza A by PCR NEGATIVE NEGATIVE Final   Influenza B by PCR NEGATIVE NEGATIVE Final    Comment: (NOTE) The Xpert Xpress SARS-CoV-2/FLU/RSV plus assay is intended as an aid in the diagnosis of influenza from Nasopharyngeal swab specimens and should not be used as a sole basis for treatment.  Nasal washings and aspirates are unacceptable for Xpert Xpress SARS-CoV-2/FLU/RSV testing.  Fact Sheet for Patients: BloggerCourse.comhttps://www.fda.gov/media/152166/download  Fact Sheet for Healthcare Providers: SeriousBroker.ithttps://www.fda.gov/media/152162/download  This test is not yet approved or cleared by the [place] FDA and has been authorized for detection and/or diagnosis of SARS-CoV-2 by FDA under an Emergency Use Authorization (EUA). This EUA will remain in effect (meaning this test can be used) for the duration of the COVID-19 declaration under Section 564(b)(1) of the Act, 21 U.S.C. section 360bbb-3(b)(1), unless the authorization is terminated or revoked.  Performed at [facility], [address] [place], [place] [address]   Gastrointestinal Panel by PCR , Stool     Status: Abnormal   Collection Time: 04/16/21 10:29 AM   Specimen: Stool  Result Value Ref Range Status   Campylobacter species NOT DETECTED NOT DETECTED Final   Plesimonas shigelloides NOT DETECTED NOT DETECTED Final   Salmonella species NOT DETECTED NOT DETECTED Final   Yersinia enterocolitica NOT DETECTED NOT DETECTED Final   Vibrio species NOT DETECTED NOT DETECTED Final   Vibrio cholerae NOT DETECTED NOT DETECTED Final   Enteroaggregative E coli (EAEC) NOT DETECTED NOT DETECTED Final  Enteropathogenic E coli (EPEC) NOT DETECTED NOT DETECTED Final   Enterotoxigenic E coli (ETEC) NOT DETECTED NOT DETECTED Final   Shiga like toxin producing E coli (STEC) NOT DETECTED NOT DETECTED Final   Shigella/Enteroinvasive E coli (EIEC) NOT DETECTED NOT DETECTED Final   Cryptosporidium NOT DETECTED NOT DETECTED Final   Cyclospora cayetanensis NOT DETECTED NOT DETECTED Final   Entamoeba histolytica NOT DETECTED NOT DETECTED Final   Giardia lamblia NOT DETECTED NOT DETECTED Final   Adenovirus F40/41 NOT DETECTED NOT DETECTED Final   Astrovirus NOT DETECTED NOT DETECTED Final   Norovirus GI/GII DETECTED (A) NOT DETECTED Final     Comment: RESULT CALLED TO, READ BACK BY AND VERIFIED WITH: Wonda Horner RN 1118 04/18/21 HNM    Rotavirus A NOT DETECTED NOT DETECTED Final   Sapovirus (I, II, IV, and V) NOT DETECTED NOT DETECTED Final    Comment: Performed at [facility], [address] [place], [place] [address]  Body fluid culture w Gram Stain     Status: None (Preliminary result)   Collection Time: 04/19/21  7:52 PM   Specimen: Synovium; Synovial Fluid  Result Value Ref Range Status   Specimen Description SYNOVIAL KNEE JOINT FLUID  Final   Special Requests Immunocompromised  Final   Gram Stain   Final    MODERATE WBC PRESENT, PREDOMINANTLY PMN NO ORGANISMS SEEN    Culture   Final    NO GROWTH < 12 HOURS Performed at [facility], [address] [place], [place] [address]    Report Status PENDING  Incomplete  Anaerobic culture w Gram Stain     Status: None (Preliminary result)   Collection Time: 04/19/21  7:52 PM   Specimen: Synovium; Synovial Fluid  Result Value Ref Range Status   Specimen Description SYNOVIAL KNEE JOINT FLUID  Final   Special Requests Immunocompromised  Final   Gram Stain   Final    MODERATE WBC PRESENT, PREDOMINANTLY PMN NO ORGANISMS SEEN Performed at [facility], [address] [place],  [address]    Culture PENDING  Incomplete   Report Status PENDING  Incomplete     Time coordinating discharge: Over 30 minutes  SIGNED:   Darliss Cheney, MD  [facility] 04/20/2021, 10:31 AM  If 7PM-7AM, please contact night-coverage [email or web address]

## 2021-04-20 NOTE — Progress Notes (Signed)
Synovial cell count of around 17,000 with 77% neutrophils.  No crystals seen on fluid analysis.  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  Do not anticipate surgical intervention at this time.  Plan for repeat aspiration in about a week either in our clinic or I can aspirate his knee again in CIR if necessary.  Looking for a trend to see if the cell count is diminishing or increasing compared with yesterday's aspiration results.  We will hold off on any cortisone injection but will consider Toradol injection into the joint around noon today..Marland Kitchen

## 2021-04-20 NOTE — Progress Notes (Signed)
Occupational Therapy Treatment Patient Details Name: Tony Mcintosh MRN: [id number] DOB: 06/07/1945 Today's Date: 04/20/2021   History of present illness Tony Mcintosh is a 76 y.o. male with medical history significant for lumbar spinal stenosis, peripheral polyneuropathy, chronic back pain, restless leg syndrome, BPH, who is admitted to [facility] on 04/11/2021 for further evaluation and management of presenting numbness/weakness involving the bilateral lower extremities. 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 L knee aspiration 02/17/22.   OT comments  Pt reports improved L knee pain vs last visit, but it remains weak with minimal ability to withstand weight when attempting to stand with stedy. Pt requires 2 person assist for bed level and sit to stand. Flexed posture in stedy. Pt reports L thumb soreness. Pt left on bed pan with call button, NT aware.    Recommendations for follow up therapy are one component of a multi-disciplinary discharge planning process, led by the attending physician.  Recommendations may be updated based on patient status, additional functional criteria and insurance authorization.    Follow Up Recommendations  Acute inpatient rehab (3hours/day)    Assistance Recommended at Discharge Frequent or constant Supervision/Assistance  Patient can return home with the following  Two people to help with walking and/or transfers;Two people to help with bathing/dressing/bathroom;Assistance with cooking/housework;Assist for transportation;Help with stairs or ramp for entrance   Equipment Recommendations  Other (comment) (defer to next venue)    Recommendations for Other Services      Precautions / Restrictions Precautions Precautions: Fall Restrictions Weight Bearing Restrictions: No       Mobility Bed Mobility Overal bed mobility: Needs Assistance Bed Mobility: Supine to Sit, Sit to Supine, Rolling Rolling: Mod assist, +2 for physical assistance    Supine to sit: +2 for physical assistance, Mod assist Sit to supine: Mod assist, +2 for physical assistance   General bed mobility comments: assist for R LE over EOB, hips to EOB and to raise trunk, returned to sidelying with assist to guide trunk onto his side and raise B LEs into bed for placement of bed pan, assist from sidelying to supine to position on bed pan    Transfers Overall transfer level: Needs assistance Equipment used: Ambulation equipment used Transfers: Sit to/from Stand Sit to Stand: +2 physical assistance, Min assist, From elevated surface           General transfer comment: from heightened bed x 1, pt with flexed trunk, L weakness and pain and L thumb weakness impeding ability to stand fully upright Transfer via Lift Equipment: Stedy   Balance Overall balance assessment: Needs assistance Sitting-balance support: Feet supported Sitting balance-Leahy Scale: Fair     Standing balance support: During functional activity Standing balance-Leahy Scale: Zero                             ADL either performed or assessed with clinical judgement   ADL Overall ADL's : Needs assistance/impaired                     Lower Body Dressing: Total assistance;Bed level;+2 for physical assistance     Toilet Transfer Details (indicate cue type and reason): total assist, bed pan Toileting- Clothing Manipulation and Hygiene: Total assistance;Bed level              Extremity/Trunk Assessment Upper Extremity Assessment RUE Deficits / Details: reports sorness in L thumb  Vision       Perception     Praxis      Cognition Arousal/Alertness: Awake/alert Behavior During Therapy: WFL for tasks assessed/performed Overall Cognitive Status: Within Functional Limits for tasks assessed                                          Exercises      Shoulder Instructions       General Comments      Pertinent Vitals/  Pain       Pain Assessment Pain Assessment: Faces Faces Pain Scale: Hurts even more Pain Location: L knee, L thumb Pain Descriptors / Indicators: Spasm, Grimacing, Guarding, Discomfort, Sore Pain Intervention(s): Monitored during session, Premedicated before session, Repositioned  Home Living                                          Prior Functioning/Environment              Frequency  Min 2X/week        Progress Toward Goals  OT Goals(current goals can now be found in the care plan section)  Progress towards OT goals: Not progressing toward goals - comment  Acute Rehab OT Goals OT Goal Formulation: With patient Time For Goal Achievement: 04/30/21 Potential to Achieve Goals: Good  Plan Discharge plan remains appropriate    Co-evaluation    PT/OT/SLP Co-Evaluation/Treatment: Yes Reason for Co-Treatment: For patient/therapist safety;Complexity of the patient's impairments (multi-system involvement)   OT goals addressed during session: Strengthening/ROM      AM-PAC OT "6 Clicks" Daily Activity     Outcome Measure   Help from another person eating meals?: None Help from another person taking care of personal grooming?: A Little Help from another person toileting, which includes using toliet, bedpan, or urinal?: Total Help from another person bathing (including washing, rinsing, drying)?: A Lot Help from another person to put on and taking off regular upper body clothing?: A Little Help from another person to put on and taking off regular lower body clothing?: Total 6 Click Score: 14    End of Session    OT Visit Diagnosis: Unsteadiness on feet (R26.81);Other abnormalities of gait and mobility (R26.89);Muscle weakness (generalized) (M62.81)   Activity Tolerance Patient limited by pain   Patient Left in bed;with call bell/phone within reach (on bed pan)   Nurse Communication Other (comment) (pt on bed pan)        Time: [id number] OT  Time Calculation (min): 34 min  Charges: OT General Charges $OT Visit: 1 Visit OT Treatments $Therapeutic Activity: 8-22 mins  Martie RoundJulie Malakai Schoenherr, OTR/L Acute Rehabilitation Services Pager: [phone] Office: [phone]  Evern BioMayberry, [place] Mul[place] Lynn 04/20/2021, 12:03 PM

## 2021-04-20 NOTE — Progress Notes (Signed)
Inpatient Rehab Admissions Coordinator:  ° °I have a bed for this Pt. On CIR today and will arrange transport over. RN may call [phone] for report. ° °Kaleab Frasier, MS, CCC-SLP °Rehab Admissions Coordinator  °[phone] (celll) °[phone] (office) ° °

## 2021-04-20 NOTE — Progress Notes (Signed)
Reviewed discharge summary and discussed other home meds with pharmacy. Restart methotrexate,  folic acid, calcium carbonate, Mirapex and weekly vitamin D.

## 2021-04-20 NOTE — Progress Notes (Addendum)
PMR Admission Coordinator Pre-Admission Assessment   Patient: Tony Mcintosh is an 76 y.o., male MRN: [id number] DOB: 1945/09/01 Height: _0  (177.8 cm) Weight: (!) 138.3 kg   Insurance Information HMO:     PPO:      PCP:      IPA:      80/20:      OTHER:  PRIMARY: [place]    Policy#: [id number]      Subscriber: patient CM Name: Osker Mason      Phone#: [phone] X [place]     Fax#: [phone] Pre-Cert#: [id number] Pt. Was approved by Osker Mason on 04/20/20 for 100 days      Employer: Retired Benefits:  Phone #: [phone]     Name:  Eff. Date: Verified eligibility 04/19/21     Deduct:     Out of Pocket Max:       Life Max:  CIR:       SNF:  Outpatient:      Co-Pay:  Home Health:       Co-Pay:  DME:      Co-Pay:  Providers: in-network   SECONDARY: BCBS/Federal Employee       Policy#: [id number]     Phone#: [phone]   Financial Counselor:       Phone#:    The Data Collection Information Summary for patients in Inpatient Rehabilitation Facilities with attached Privacy Act [place] Records was provided and verbally reviewed with: N/A   Emergency Contact Information Contact Information       Name Relation Home Work Mobile    Betke,Cathy Spouse     [phone]    Menelik, Mcfarren     [phone]           Current Medical History  Patient Admitting Diagnosis: Guillain-Barre syndrome   History of Present Illness: Pt is a 76 year old male with medical hx significant for: back surgery (2011), peripheral neuropathy, chronic back pain, restless leg syndrome, BPH, lumbar spinal stenosis. Pt presented to hospital on 04/11/21 d/t numbness/weakness progressing down his body. Pt reports it started in his neck and has progressed down to his bilateral lower extremities with an element of ascension of symptoms. CT head negative for acute intracranial abnormalities. MRI of the cervical, thoracic, and lumbar spine revealed no acute findings.  Neurosurgery consulted and felt images and clinical exam were suggestive of chronic processes; no emergent or urgent operative intervention recommended. Neurology recommended IR consultation for fluoroscopic guided lumbar puncture. LP results came back with high protein and no WBCs which is consistent with diagnosis of Guillain-Barre syndrome. Pt given IVIG x5 days. Therapy evaluations completed and CIR recommended d/t pt's deficits in functional mobility and ability complete ADLs independently.  Complete NIHSS TOTAL: 0   Patient's medical record from [facility] has been reviewed by the rehabilitation admission coordinator and physician.   Past Medical History      Past Medical History:  Diagnosis Date   Back complaints     BPH (benign prostatic hyperplasia)     Chronic back pain     Lumbar stenosis     Osteoporosis     Vitamin D deficiency        Has the patient had major surgery during 100 days prior to admission? No   Family History   family history includes Stroke in his father.   Current Medications   Current Facility-Administered Medications:    acetaminophen (TYLENOL) tablet 650 mg, 650 mg, Oral, Q6H PRN, 650  mg at 04/19/21 0515 **OR** acetaminophen (TYLENOL) suppository 650 mg, 650 mg, Rectal, Q6H PRN, Howerter, Justin B, DO   acidophilus (RISAQUAD) capsule 1 capsule, 1 capsule, Oral, Daily, Vann, Jessica U, DO, 1 capsule at 04/19/21 1116   B-complex with vitamin C tablet 1 tablet, 1 tablet, Oral, BID, Vann, Jessica U, DO, 1 tablet at 04/19/21 1114   bisacodyl (DULCOLAX) suppository 10 mg, 10 mg, Rectal, Daily PRN, Darliss Cheney, MD, 10 mg at 04/18/21 2059   diazepam (VALIUM) injection 5-10 mg, 5-10 mg, Intravenous, Q6H PRN, Vann, Jessica U, DO, 10 mg at 04/18/21 2223   docusate sodium (COLACE) capsule 100 mg, 100 mg, Oral, Daily PRN, Vann, Jessica U, DO, 100 mg at 04/18/21 1015   fentaNYL (SUBLIMAZE) injection 25 mcg, 25 mcg, Intravenous, Q4H PRN, Kirby-Graham, Karsten Fells, NP, 25 mcg at 40/98/11 9147   folic acid (FOLVITE) tablet 1 mg, 1 mg, Oral, Daily, Vann, Jessica U, DO, 1 mg at 04/19/21 1115   gabapentin (NEURONTIN) capsule 300 mg, 300 mg, Oral, QHS, Howerter, Justin B, DO, 300 mg at 04/18/21 2221   loperamide (IMODIUM) capsule 2 mg, 2 mg, Oral, PRN, Vann, Jessica U, DO, 2 mg at 04/16/21 1042   naloxone (NARCAN) injection 0.4 mg, 0.4 mg, Intravenous, PRN, Howerter, Justin B, DO   pramipexole (MIRAPEX) tablet 1.5 mg, 1.5 mg, Oral, Daily, Kirby-Graham, Karsten Fells, NP, 1.5 mg at 04/19/21 1115   pramipexole (MIRAPEX) tablet 1.5 mg, 1.5 mg, Oral, q1600, Vann, Jessica U, DO, 1.5 mg at 04/18/21 1648   predniSONE (DELTASONE) tablet 2.5 mg, 2.5 mg, Oral, Daily, Vann, Jessica U, DO, 2.5 mg at 04/19/21 1115   tamsulosin (FLOMAX) capsule 0.4 mg, 0.4 mg, Oral, QHS, Howerter, Justin B, DO, 0.4 mg at 04/18/21 2221   Vibegron TABS 75 mg, 75 mg, Oral, Daily, Vann, Jessica U, DO, 75 mg at 04/19/21 1116   Patients Current Diet:  Diet Order                  Diet regular Room service appropriate? Yes; Fluid consistency: Thin  Diet effective now                         Precautions / Restrictions Precautions Precautions: Fall Precaution Comments: L knee buckles Restrictions Weight Bearing Restrictions: No    Has the patient had 2 or more falls or a fall with injury in the past year? Yes   Prior Activity Level Community (5-7x/wk): drives, gets out of house daily   Prior Functional Level Self Care: Did the patient need help bathing, dressing, using the toilet or eating? Needed some help   Indoor Mobility: Did the patient need assistance with walking from room to room (with or without device)? Needed some help (pt reported can walk for 5 minutes at a time)   Stairs: Did the patient need assistance with internal or external stairs (with or without device)? Dependent (has a lift at home)   Functional Cognition: Did the patient need help planning regular tasks such  as shopping or remembering to take medications? Independent   Patient Information Are you of Hispanic, Latino/a,or Spanish origin?: A. No, not of Hispanic, Latino/a, or Spanish origin What is your race?: A. White Do you need or want an interpreter to communicate with a doctor or health care staff?: 0. No   Patient's Response To:  Health Literacy and Transportation Is the patient able to respond to health literacy and transportation needs?: Yes Health  Literacy - How often do you need to have someone help you when you read instructions, pamphlets, or other written material from your doctor or pharmacy?: Never In the past 12 months, has lack of transportation kept you from medical appointments or from getting medications?: Yes (due to pt's limited ability to ambulate, parking at some medical appointments and inability to take power wheelchair has caused him to miss a few appointments in the past year) In the past 12 months, has lack of transportation kept you from meetings, work, or from getting things needed for daily living?: No   Home Assistive Devices / [place] Devices/Equipment: Environmental consultant (specify type) Home Equipment: Transport planner, Conservation officer, nature (2 wheels), [employer] - single point   Prior Device Use: Indicate devices/aids used by the patient prior to current illness, exacerbation or injury? Motorized wheelchair or scooter, Mechanical lift, and cane   Current Functional Level Cognition   Overall Cognitive Status: Within Functional Limits for tasks assessed Orientation Level: Oriented X4 General Comments: pt well aware of his limits    Extremity Assessment (includes Sensation/Coordination)   Upper Extremity Assessment: RUE deficits/detail, LUE deficits/detail RUE Deficits / Details: 5/5 all areas LUE Deficits / Details: shoulder 4/5, elbow 4/5, hand 5/5  Lower Extremity Assessment: Generalized weakness     ADLs   Overall ADL's : Needs  assistance/impaired Eating/Feeding: Set up, Sitting Grooming: Set up, Sitting Upper Body Bathing: Minimal assistance, Sitting Lower Body Bathing: Total assistance, +2 for safety/equipment, Sit to/from stand Upper Body Dressing : Set up, Sitting Lower Body Dressing: Total assistance, Sit to/from stand Toilet Transfer: +2 for safety/equipment, Moderate assistance, Ambulation, Rolling walker (2 wheels) Toileting- Clothing Manipulation and Hygiene: Total assistance, Sit to/from stand Functional mobility during ADLs: +2 for safety/equipment, Minimal assistance     Mobility   General bed mobility comments: patient received sitting on side of bed     Transfers   Overall transfer level: Needs assistance Equipment used: Rolling walker (2 wheels), Ambulation equipment used Transfers: Sit to/from Stand Sit to Stand: +2 physical assistance, Mod assist, Min assist Bed to/from chair/wheelchair/BSC transfer type:: Via Lift equipment Step pivot transfers: Min assist, +2 safety/equipment Transfer via Lift Equipment: Stedy General transfer comment: from elevated surface x 5, cues for hand placement, use of momentum, progressed from max to mod +2 for safety. Pt could not take steps today due to left LE weakness and pain.  He tried multiple attempts with RW and then obtained Stedy and pt worked on sit to stand with Stedy and at least shifting weight side to side and attemptimg to raise the feet off the [place]. Pt very weak today and could not safely transfer. Placed heat to hamstring to see if that helps.     Ambulation / Gait / Stairs / Wheelchair Mobility   Ambulation/Gait Ambulation/Gait assistance: Min assist, +2 safety/equipment Gait Distance (Feet): 15 Feet Assistive device: Rolling walker (2 wheels) Gait Pattern/deviations: Step-through pattern, Knee hyperextension - left, Wide base of support, Trunk flexed General Gait Details: Slow, unsteady gait with bil knee instability (left>right) with left  knee hyperextensiond uring stance phase, heavy reliance on UEs, looking down due to no sensation in LEs/feet. Gait velocity: decreased     Posture / Balance Balance Overall balance assessment: Needs assistance Sitting-balance support: Feet supported Sitting balance-Leahy Scale: Good Standing balance support: During functional activity Standing balance-Leahy Scale: Poor Standing balance comment: unable to release walker or stedy in static standing     Special needs/care consideration none  Previous Home Environment (from acute therapy documentation) Living Arrangements: Spouse/significant other  Lives With: Spouse Available Help at Discharge: Family, Available 24 hours/day Type of Home: House Home Layout: Two level Alternate Level Stairs-Rails:  (has an outdoor lift to get to second level of house) Alternate Level Stairs-Number of Steps: uses outdoor elevator to get to second level of house Home Access: Level entry, Stairs to enter (ground level at back) Entrance Stairs-Rails:  (useds outdoor lift to get to second level of house) Bathroom Shower/Tub: Public librarian, Multimedia programmer: Standard Bathroom Accessibility: Yes How Accessible: Accessible via walker [place]: No   Discharge Living Setting Plans for Discharge Living Setting: Patient's home Type of Home at Discharge: House Discharge Home Layout: Two level Alternate Level Stairs-Rails:  (uses outdoor lift to get to second level) Discharge Home Access: Stairs to enter, Level entry (5 steps in front; ground level in back of house) Entrance Stairs-Rails:  (uses outdoor lift to get to second level of house) Discharge Bathroom Shower/Tub: Tub/shower unit, Horticulturist, commercial: Standard Discharge Bathroom Accessibility: Yes How Accessible: Accessible via walker (pt uses cane inside house for short distances) Does the patient have any problems obtaining your medications?: No    Social/Family/Support Systems Patient Roles: Spouse Anticipated Caregiver: Alder Murri, wife; 2 sons Anticipated Caregiver's Contact Information: [place]: [phone] Caregiver Availability: 24/7 Discharge Plan Discussed with Primary Caregiver: Yes Is Caregiver In Agreement with Plan?: Yes Does Caregiver/Family have Issues with Lodging/Transportation while Pt is in Rehab?: No   Goals Patient/Family Goal for Rehab: Supervision: PT/OT Expected length of stay: 18-21 days Pt/Family Agrees to Admission and willing to participate: Yes Program Orientation Provided & Reviewed with Pt/Caregiver Including Roles  & Responsibilities: Yes   Decrease burden of Care through IP rehab admission: NA   Possible need for SNF placement upon discharge: Not anticipated   Patient Condition: I have reviewed medical records from [facility], spoken with CM, and patient, spouse, and son. I met with patient at the bedside for inpatient rehabilitation assessment.  Patient will benefit from ongoing PT and OT, can actively participate in 3 hours of therapy a day 5 days of the week, and can make measurable gains during the admission.  Patient will also benefit from the coordinated team approach during an Inpatient Acute Rehabilitation admission.  The patient will receive intensive therapy as well as Rehabilitation physician, nursing, social worker, and care management interventions.  Due to bladder management, safety, disease management, medication administration, pain management, and patient education the patient requires 24 hour a day rehabilitation nursing.  The patient is currently mod-max with mobility and basic ADLs.  Discharge setting and therapy post discharge at home with home health is anticipated.  Patient has agreed to participate in the Acute Inpatient Rehabilitation Program and will admit today.   Preadmission Screen Completed By:  Bethel Born, 04/19/2021 1:43 PM, with day of updates by  Clemens Catholic, MS, CCC-SLP     ______________________________________________________________________   Discussed status with Dr. Ranell Patrick  on 04/20/21 at 69 and received approval for admission today.   Admission Coordinator:  Bethel Born, CCC-SLP, time 930/Date 04/20/21    Assessment/Plan: Diagnosis: GBS Does the need for close, 24 hr/day Medical supervision in concert with the patient's rehab needs make it unreasonable for this patient to be served in a less intensive setting? Yes Co-Morbidities requiring supervision/potential complications: morbid obesity, lower extremity weakness, left knee pain and swelling, left thumb pain and swelling, BPH  Due to bladder management, bowel management, safety, skin/wound care, disease management, medication administration, pain management, and patient education, does the patient require 24 hr/day rehab nursing? Yes Does the patient require coordinated care of a physician, rehab nurse, PT, OT to address physical and functional deficits in the context of the above medical diagnosis(es)? Yes Addressing deficits in the following areas: balance, endurance, locomotion, strength, transferring, bowel/bladder control, bathing, dressing, feeding, grooming, toileting, and psychosocial support Can the patient actively participate in an intensive therapy program of at least 3 hrs of therapy 5 days a week? Yes The potential for patient to make measurable gains while on inpatient rehab is excellent Anticipated functional outcomes upon discharge from inpatient rehab: min assist PT, min assist OT, independent SLP Estimated rehab length of stay to reach the above functional goals is: 14-18 days Anticipated discharge destination: Home 10. Overall Rehab/Functional Prognosis: excellent     MD Signature: Leeroy Cha, MD

## 2021-04-20 NOTE — Discharge Summary (Signed)
Physician Discharge Summary  Patient ID: Tony Mcintosh MRN: [id number] DOB/AGE: January 11, 1946 76 y.o.  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  No known injury. EXAM: RIGHT SHOULDER - 2+ VIEW COMPARISON:  None. FINDINGS: No acute fracture or dislocation. No aggressive osseous lesion. Normal alignment. Generalized osteopenia. Mild arthropathy of the acromioclavicular joint. No significant glenohumeral arthropathy. Soft tissue are unremarkable. No radiopaque foreign body or soft tissue emphysema. IMPRESSION: 1. No acute osseous injury of the right shoulder. Electronically Signed   By: Kathreen Devoid M.D.   On: 05/02/2021 14:48   DG Knee 1-2 Views Left  Result Date: 04/19/2021 CLINICAL DATA:  LEFT knee pain, limited range of motion, no known injury EXAM: LEFT KNEE - 1-2 VIEW COMPARISON:  None FINDINGS: Osseous mineralization low normal. Scattered joint space narrowing and spur formation greatest at patellofemoral joint. No acute fracture, dislocation, or bone destruction. Trace joint effusion. 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 Electronically Signed   By: Lavonia Dana M.D.   On: 04/19/2021 14:12   CT HEAD WO CONTRAST  Result Date: 04/11/2021 CLINICAL DATA:  76 year old male with history of weakness and numbness. Paresthesia. EXAM: CT HEAD WITHOUT CONTRAST TECHNIQUE: Contiguous axial images were obtained from the base of the skull through the vertex without intravenous contrast. RADIATION DOSE REDUCTION: This  exam was performed according to the departmental dose-optimization program which includes automated exposure control, adjustment of the mA and/or kV according to patient size and/or use of iterative reconstruction technique. COMPARISON:  No priors. FINDINGS: Brain: Mild cerebral atrophy. 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 No evidence of acute infarction, hemorrhage, hydrocephalus, extra-axial collection or mass lesion/mass effect. Vascular: No hyperdense vessel or unexpected calcification. Skull: Normal. Negative for fracture or focal lesion. Sinuses/Orbits: No acute finding. Small mucosal retention cyst or polyp in the posterior aspect of the right maxillary sinus incidentally noted. Other: None. IMPRESSION: 1. No acute intracranial abnormalities. 2. Very mild cerebral atrophy and mild chronic microvascular ischemic changes are noted in the cerebral white matter, as above. Electronically Signed   By: Vinnie Langton M.D.   On: 04/11/2021 10:04   MR Cervical Spine Wo Contrast  Result Date: 04/11/2021 CLINICAL DATA:  Incontinence, numbness, weakness. Numbness in hands starting yesterday, no control of urine or bowel movements, unable to walk, no feeling in legs EXAM: MRI CERVICAL, THORACIC AND LUMBAR SPINE WITHOUT CONTRAST TECHNIQUE: Multiplanar and multiecho pulse sequences of the cervical spine, to include the craniocervical junction and cervicothoracic junction, and thoracic and lumbar spine, were obtained without intravenous contrast. COMPARISON:  None. FINDINGS: MRI CERVICAL SPINE FINDINGS Alignment: There is straightening of the normal cervical spine lordosis. There is no antero or retrolisthesis. Vertebrae: Vertebral body heights are preserved. There is no suspicious marrow signal abnormality. There is no marrow edema. Cord: Normal in  signal and morphology. Posterior Fossa, vertebral arteries, paraspinal tissues: The imaged posterior fossa is unremarkable. The vertebral artery flow voids are present. The paraspinal soft tissues are unremarkable. Disc levels: Evaluation of the neural foramina  is suboptimal due to poor signal on the axial T2 images. There is multilevel disc desiccation and narrowing, most advanced at C4-C5 and C5-C6. There is multilevel facet arthropathy, overall left worse than right. C2-C3: Mild uncovertebral and bilateral facet arthropathy result in mild left and no significant right neural foraminal stenosis without significant spinal canal stenosis C3-C4: There is a posterior disc osteophyte complex and uncovertebral and bilateral facet arthropathy resulting in severe left worse than right neural foraminal stenosis and mild spinal canal stenosis. C4-C5: There is a posterior disc osteophyte complex and uncovertebral and bilateral facet arthropathy resulting in severe left worse than right neural foraminal stenosis and mild to moderate spinal canal stenosis. C5-C6: There is a posterior disc osteophyte complex and uncovertebral and bilateral facet arthropathy resulting in severe bilateral neural foraminal stenosis and mild spinal canal stenosis. C6-C7: There is a posterior disc osteophyte complex and uncovertebral and bilateral facet arthropathy resulting in severe bilateral neural foraminal stenosis and mild-to-moderate spinal canal stenosis. C7-T1: There is a mild disc bulge resulting in mild right and no significant left neural foraminal stenosis without significant spinal canal stenosis. MRI THORACIC SPINE FINDINGS Alignment:  Normal. Vertebrae: Vertebral body heights are preserved. There is partial fusion of the T5 and T6 vertebral bodies. Cord:  Normal in signal and morphology. Paraspinal and other soft tissues: Unremarkable. Disc levels: There is multilevel disc desiccation and narrowing throughout the thoracic spine.  There are bulky flowing anterior endplate osteophytes throughout the mid and lower thoracic spine consistent with diffuse idiopathic skeletal hyperostosis. T2-T3: There is a mild disc protrusion without significant spinal canal or neural foraminal stenosis. T3-T4: There is a small right paracentral disc protrusion without significant spinal canal or neural foraminal stenosis. T4-T5: There is a small left paracentral disc protrusion without significant spinal canal or neural foraminal stenosis. T6-T7: There is a small central protrusion resulting in mild spinal canal narrowing without significant neural foraminal stenosis. T7-T8: There is a small left paracentral disc protrusion without significant spinal canal or neural foraminal stenosis. T8-T9: There is a prominent central protrusion resulting in mild spinal canal stenosis with effacement of the ventral thecal sac but no mass effect on the cord, and no significant neural foraminal stenosis. T9-T10: There is a small central protrusion without significant spinal canal or neural foraminal stenosis. T10-T11: There is a large disc protrusion/extrusion and bilateral facet arthropathy resulting in moderate spinal canal stenosis with effacement of the ventral thecal sac and mild indentation of the cord and mild bilateral neural foraminal stenosis. T11-T12: There is a prominent central protrusion resulting in mild spinal canal stenosis with effacement of the ventral thecal sac without significant neural foraminal stenosis. T12-L1: There is a prominent central protrusion/extrusion resulting in mild-to-moderate spinal canal stenosis without significant neural foraminal stenosis. MRI LUMBAR SPINE FINDINGS Segmentation:  Standard. Alignment: There is dextroscoliosis of the lumbar spine centered at L2-L3. There is grade 1 retrolisthesis of L1 on L2, L2 on L3, L3 on L4, and L4 on L5, all likely degenerative in nature. Vertebrae: There is fusion of the L1 through L3 vertebral  bodies. Marrow signal is mildly heterogeneous with degenerative signal abnormality at L1 through L3 and about the L4-L5 disc space. There is no suspicious marrow signal abnormality. Postsurgical changes are noted reflecting posterior decompression at L1-L2 through L3-L4. Conus medullaris and cauda equina: Conus extends to the mid L1 level. Conus and cauda equina appear normal. Paraspinal and other soft tissues: There is atrophy of the right psoas muscle. Disc levels: There is  obliteration of the disc spaces at L1-L2 and L2-L3. There is advanced disc space narrowing and desiccation throughout the remainder of the lumbar spine. There is severe facet arthropathy at L4-L5 and L5-S1, slightly worse on the right. There are associated trace bilateral facet joint effusions. L1-L2: Status post posterior decompression. There is a broad-based central disc protrusion, degenerative endplate change, and bilateral facet arthropathy resulting in mild spinal canal stenosis with effacement of the left subarticular zone and suspected impingement of the traversing left L2 nerve root without significant neural foraminal stenosis. L2-L3: Status post posterior decompression. There is degenerative endplate change and bilateral facet arthropathy resulting in mild left and no significant right neural foraminal stenosis and no significant spinal canal stenosis. L3-L4: Status post posterior decompression. There is a mild disc bulge, degenerative endplate change, and bilateral facet arthropathy resulting in mild narrowing of the subarticular zones, left worse than right, without evidence of frank nerve root impingement, and mild right worse than left neural foraminal stenosis. L4-L5: There is prominent endplate spurring and bulky bilateral facet arthropathy resulting in moderate to severe spinal canal stenosis with effacement of the subarticular zones and possible impingement of the traversing L5 nerve roots, and moderate left and severe right  neural foraminal stenosis with impingement of the exiting right L4 nerve root. L5-S1: There is a diffuse disc bulge, degenerative endplate change, and bilateral facet arthropathy resulting in severe left worse than right with impingement of the exiting L5 nerve roots. IMPRESSION: CERVICAL SPINE MRI: 1. Evaluation of the neural foramina is suboptimal due to poor signal on the axial T2 images. Within this confine: 2. Multilevel degenerative changes throughout the cervical spine as detailed above resulting in up to mild-to-moderate spinal canal stenosis at C4-C5 and C6-C7 and severe bilateral neural foraminal stenosis at C3-C4 through C6-C7. 3. No evidence of cord compression or cord signal abnormality. THORACIC SPINE MRI: 1. Large disc protrusion/extrusion at T10-T11 resulting in moderate spinal canal stenosis with mild indentation of the cord and mild bilateral neural foraminal stenosis. 2. Numerous additional disc protrusions throughout the remainder of the thoracic spine as detailed above without other high-grade spinal canal stenosis. No high-grade neural foraminal stenosis or evidence of nerve root impingement in the thoracic spine. 3. 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 4. No cord signal abnormality. LUMBAR SPINE MRI: 1. Postsurgical changes reflecting posterior decompression at L1-L2 through L3-L4. No high-grade spinal canal stenosis at the surgical levels, but there is effacement of the left subarticular zone at L1-L2 with possible impingement of the traversing left L2 nerve root. 2. 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 3. Severe left worse than right neural foraminal stenosis at L5-S1 with impingement of the exiting L5 nerve roots. 4. Stepwise  grade 1 retrolisthesis of L1 on L2 through L4 on L5, likely degenerative in nature. 5. Fusion of the L1 through L3 vertebral bodies. 6. Severe facet arthropathy at L4-L5 and L5-S1, slightly worse on the right, with associated trace facet joint effusions. Electronically Signed   By: Lesia Hausen M.D.   On: 04/11/2021 17:37   MR THORACIC SPINE WO CONTRAST  Result Date: 04/11/2021 CLINICAL DATA:  Incontinence, numbness, weakness. Numbness in hands starting yesterday, no control of urine or bowel movements, unable to walk, no feeling in legs EXAM: MRI CERVICAL,  THORACIC AND LUMBAR SPINE WITHOUT CONTRAST TECHNIQUE: Multiplanar and multiecho pulse sequences of the cervical spine, to include the craniocervical junction and cervicothoracic junction, and thoracic and lumbar spine, were obtained without intravenous contrast. COMPARISON:  None. FINDINGS: MRI CERVICAL SPINE FINDINGS Alignment: There is straightening of the normal cervical spine lordosis. There is no antero or retrolisthesis. Vertebrae: Vertebral body heights are preserved. There is no suspicious marrow signal abnormality. There is no marrow edema. Cord: Normal in signal and morphology. Posterior Fossa, vertebral arteries, paraspinal tissues: The imaged posterior fossa is unremarkable. The vertebral artery flow voids are present. The paraspinal soft tissues are unremarkable. Disc levels: Evaluation of the neural foramina is suboptimal due to poor signal on the axial T2 images. There is multilevel disc desiccation and narrowing, most advanced at C4-C5 and C5-C6. There is multilevel facet arthropathy, overall left worse than right. C2-C3: Mild uncovertebral and bilateral facet arthropathy result in mild left and no significant right neural foraminal stenosis without significant spinal canal stenosis C3-C4: There is a posterior disc osteophyte complex and uncovertebral and bilateral facet arthropathy resulting in severe left worse than right neural foraminal  stenosis and mild spinal canal stenosis. C4-C5: There is a posterior disc osteophyte complex and uncovertebral and bilateral facet arthropathy resulting in severe left worse than right neural foraminal stenosis and mild to moderate spinal canal stenosis. C5-C6: There is a posterior disc osteophyte complex and uncovertebral and bilateral facet arthropathy resulting in severe bilateral neural foraminal stenosis and mild spinal canal stenosis. C6-C7: There is a posterior disc osteophyte complex and uncovertebral and bilateral facet arthropathy resulting in severe bilateral neural foraminal stenosis and mild-to-moderate spinal canal stenosis. C7-T1: There is a mild disc bulge resulting in mild right and no significant left neural foraminal stenosis without significant spinal canal stenosis. MRI THORACIC SPINE FINDINGS Alignment:  Normal. Vertebrae: Vertebral body heights are preserved. There is partial fusion of the T5 and T6 vertebral bodies. Cord:  Normal in signal and morphology. Paraspinal and other soft tissues: Unremarkable. Disc levels: There is multilevel disc desiccation and narrowing throughout the thoracic spine. There are bulky flowing anterior endplate osteophytes throughout the mid and lower thoracic spine consistent with diffuse idiopathic skeletal hyperostosis. T2-T3: There is a mild disc protrusion without significant spinal canal or neural foraminal stenosis. T3-T4: There is a small right paracentral disc protrusion without significant spinal canal or neural foraminal stenosis. T4-T5: There is a small left paracentral disc protrusion without significant spinal canal or neural foraminal stenosis. T6-T7: There is a small central protrusion resulting in mild spinal canal narrowing without significant neural foraminal stenosis. T7-T8: There is a small left paracentral disc protrusion without significant spinal canal or neural foraminal stenosis. T8-T9: There is a prominent central protrusion resulting in  mild spinal canal stenosis with effacement of the ventral thecal sac but no mass effect on the cord, and no significant neural foraminal stenosis. T9-T10: There is a small central protrusion without significant spinal canal or neural foraminal stenosis. T10-T11: There is a large disc protrusion/extrusion and bilateral facet arthropathy resulting in moderate spinal canal stenosis with effacement of the ventral thecal sac and mild indentation of the cord and mild bilateral neural foraminal stenosis. T11-T12: There is a prominent central protrusion resulting in mild spinal canal stenosis with effacement of the ventral thecal sac without significant neural foraminal stenosis. T12-L1: There is a prominent central protrusion/extrusion resulting in mild-to-moderate spinal canal stenosis without significant neural foraminal stenosis. MRI LUMBAR SPINE FINDINGS Segmentation:  Standard. Alignment: There is dextroscoliosis  of the lumbar spine centered at L2-L3. There is grade 1 retrolisthesis of L1 on L2, L2 on L3, L3 on L4, and L4 on L5, all likely degenerative in nature. Vertebrae: There is fusion of the L1 through L3 vertebral bodies. Marrow signal is mildly heterogeneous with degenerative signal abnormality at L1 through L3 and about the L4-L5 disc space. There is no suspicious marrow signal abnormality. Postsurgical changes are noted reflecting posterior decompression at L1-L2 through L3-L4. Conus medullaris and cauda equina: Conus extends to the mid L1 level. Conus and cauda equina appear normal. Paraspinal and other soft tissues: There is atrophy of the right psoas muscle. Disc levels: There is obliteration of the disc spaces at L1-L2 and L2-L3. There is advanced disc space narrowing and desiccation throughout the remainder of the lumbar spine. There is severe facet arthropathy at L4-L5 and L5-S1, slightly worse on the right. There are associated trace bilateral facet joint effusions. L1-L2: Status post posterior  decompression. There is a broad-based central disc protrusion, degenerative endplate change, and bilateral facet arthropathy resulting in mild spinal canal stenosis with effacement of the left subarticular zone and suspected impingement of the traversing left L2 nerve root without significant neural foraminal stenosis. L2-L3: Status post posterior decompression. There is degenerative endplate change and bilateral facet arthropathy resulting in mild left and no significant right neural foraminal stenosis and no significant spinal canal stenosis. L3-L4: Status post posterior decompression. There is a mild disc bulge, degenerative endplate change, and bilateral facet arthropathy resulting in mild narrowing of the subarticular zones, left worse than right, without evidence of frank nerve root impingement, and mild right worse than left neural foraminal stenosis. L4-L5: There is prominent endplate spurring and bulky bilateral facet arthropathy resulting in moderate to severe spinal canal stenosis with effacement of the subarticular zones and possible impingement of the traversing L5 nerve roots, and moderate left and severe right neural foraminal stenosis with impingement of the exiting right L4 nerve root. L5-S1: There is a diffuse disc bulge, degenerative endplate change, and bilateral facet arthropathy resulting in severe left worse than right with impingement of the exiting L5 nerve roots. IMPRESSION: CERVICAL SPINE MRI: 1. Evaluation of the neural foramina is suboptimal due to poor signal on the axial T2 images. Within this confine: 2. Multilevel degenerative changes throughout the cervical spine as detailed above resulting in up to mild-to-moderate spinal canal stenosis at C4-C5 and C6-C7 and severe bilateral neural foraminal stenosis at C3-C4 through C6-C7. 3. No evidence of cord compression or cord signal abnormality. THORACIC SPINE MRI: 1. Large disc protrusion/extrusion at T10-T11 resulting in moderate spinal  canal stenosis with mild indentation of the cord and mild bilateral neural foraminal stenosis. 2. Numerous additional disc protrusions throughout the remainder of the thoracic spine as detailed above without other high-grade spinal canal stenosis. No high-grade neural foraminal stenosis or evidence of nerve root impingement in the thoracic spine. 3. 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 4. No cord signal abnormality. LUMBAR SPINE MRI: 1. Postsurgical changes reflecting posterior decompression at L1-L2 through L3-L4. No high-grade spinal canal stenosis at the surgical levels, but there is effacement of the left subarticular zone at L1-L2 with possible impingement of the traversing left L2 nerve root. 2. 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 3. Severe left  worse than right neural foraminal stenosis at L5-S1 with impingement of the exiting L5 nerve roots. 4. Stepwise grade 1 retrolisthesis of L1 on L2 through L4 on L5, likely degenerative in nature. 5. Fusion of the L1 through L3 vertebral bodies. 6. Severe facet arthropathy at L4-L5 and L5-S1, slightly worse on the right, with associated trace facet joint effusions. Electronically Signed   By: Valetta Mole M.D.   On: 04/11/2021 17:37   MR LUMBAR SPINE WO CONTRAST  Result Date: 04/11/2021 CLINICAL DATA:  Incontinence, numbness, weakness. Numbness in hands starting yesterday, no control of urine or bowel movements, unable to walk, no feeling in legs EXAM: MRI CERVICAL, THORACIC AND LUMBAR SPINE WITHOUT CONTRAST TECHNIQUE: Multiplanar and multiecho pulse sequences of the cervical spine, to include the craniocervical junction and cervicothoracic junction, and thoracic and lumbar spine, were obtained  without intravenous contrast. COMPARISON:  None. FINDINGS: MRI CERVICAL SPINE FINDINGS Alignment: There is straightening of the normal cervical spine lordosis. There is no antero or retrolisthesis. Vertebrae: Vertebral body heights are preserved. There is no suspicious marrow signal abnormality. There is no marrow edema. Cord: Normal in signal and morphology. Posterior Fossa, vertebral arteries, paraspinal tissues: The imaged posterior fossa is unremarkable. The vertebral artery flow voids are present. The paraspinal soft tissues are unremarkable. Disc levels: Evaluation of the neural foramina is suboptimal due to poor signal on the axial T2 images. There is multilevel disc desiccation and narrowing, most advanced at C4-C5 and C5-C6. There is multilevel facet arthropathy, overall left worse than right. C2-C3: Mild uncovertebral and bilateral facet arthropathy result in mild left and no significant right neural foraminal stenosis without significant spinal canal stenosis C3-C4: There is a posterior disc osteophyte complex and uncovertebral and bilateral facet arthropathy resulting in severe left worse than right neural foraminal stenosis and mild spinal canal stenosis. C4-C5: There is a posterior disc osteophyte complex and uncovertebral and bilateral facet arthropathy resulting in severe left worse than right neural foraminal stenosis and mild to moderate spinal canal stenosis. C5-C6: There is a posterior disc osteophyte complex and uncovertebral and bilateral facet arthropathy resulting in severe bilateral neural foraminal stenosis and mild spinal canal stenosis. C6-C7: There is a posterior disc osteophyte complex and uncovertebral and bilateral facet arthropathy resulting in severe bilateral neural foraminal stenosis and mild-to-moderate spinal canal stenosis. C7-T1: There is a mild disc bulge resulting in mild right and no significant left neural foraminal stenosis without significant spinal canal stenosis. MRI  THORACIC SPINE FINDINGS Alignment:  Normal. Vertebrae: Vertebral body heights are preserved. There is partial fusion of the T5 and T6 vertebral bodies. Cord:  Normal in signal and morphology. Paraspinal and other soft tissues: Unremarkable. Disc levels: There is multilevel disc desiccation and narrowing throughout the thoracic spine. There are bulky flowing anterior endplate osteophytes throughout the mid and lower thoracic spine consistent with diffuse idiopathic skeletal hyperostosis. T2-T3: There is a mild disc protrusion without significant spinal canal or neural foraminal stenosis. T3-T4: There is a small right paracentral disc protrusion without significant spinal canal or neural foraminal stenosis. T4-T5: There is a small left paracentral disc protrusion without significant spinal canal or neural foraminal stenosis. T6-T7: There is a small central protrusion resulting in mild spinal canal narrowing without significant neural foraminal stenosis. T7-T8: There is a small left paracentral disc protrusion without significant spinal canal or neural foraminal stenosis. T8-T9: There is a prominent central protrusion resulting in mild spinal canal stenosis with effacement of the ventral thecal sac but no mass effect  on the cord, and no significant neural foraminal stenosis. T9-T10: There is a small central protrusion without significant spinal canal or neural foraminal stenosis. T10-T11: There is a large disc protrusion/extrusion and bilateral facet arthropathy resulting in moderate spinal canal stenosis with effacement of the ventral thecal sac and mild indentation of the cord and mild bilateral neural foraminal stenosis. T11-T12: There is a prominent central protrusion resulting in mild spinal canal stenosis with effacement of the ventral thecal sac without significant neural foraminal stenosis. T12-L1: There is a prominent central protrusion/extrusion resulting in mild-to-moderate spinal canal stenosis without  significant neural foraminal stenosis. MRI LUMBAR SPINE FINDINGS Segmentation:  Standard. Alignment: There is dextroscoliosis of the lumbar spine centered at L2-L3. There is grade 1 retrolisthesis of L1 on L2, L2 on L3, L3 on L4, and L4 on L5, all likely degenerative in nature. Vertebrae: There is fusion of the L1 through L3 vertebral bodies. Marrow signal is mildly heterogeneous with degenerative signal abnormality at L1 through L3 and about the L4-L5 disc space. There is no suspicious marrow signal abnormality. Postsurgical changes are noted reflecting posterior decompression at L1-L2 through L3-L4. Conus medullaris and cauda equina: Conus extends to the mid L1 level. Conus and cauda equina appear normal. Paraspinal and other soft tissues: There is atrophy of the right psoas muscle. Disc levels: There is obliteration of the disc spaces at L1-L2 and L2-L3. There is advanced disc space narrowing and desiccation throughout the remainder of the lumbar spine. There is severe facet arthropathy at L4-L5 and L5-S1, slightly worse on the right. There are associated trace bilateral facet joint effusions. L1-L2: Status post posterior decompression. There is a broad-based central disc protrusion, degenerative endplate change, and bilateral facet arthropathy resulting in mild spinal canal stenosis with effacement of the left subarticular zone and suspected impingement of the traversing left L2 nerve root without significant neural foraminal stenosis. L2-L3: Status post posterior decompression. There is degenerative endplate change and bilateral facet arthropathy resulting in mild left and no significant right neural foraminal stenosis and no significant spinal canal stenosis. L3-L4: Status post posterior decompression. There is a mild disc bulge, degenerative endplate change, and bilateral facet arthropathy resulting in mild narrowing of the subarticular zones, left worse than right, without evidence of frank nerve root  impingement, and mild right worse than left neural foraminal stenosis. L4-L5: There is prominent endplate spurring and bulky bilateral facet arthropathy resulting in moderate to severe spinal canal stenosis with effacement of the subarticular zones and possible impingement of the traversing L5 nerve roots, and moderate left and severe right neural foraminal stenosis with impingement of the exiting right L4 nerve root. L5-S1: There is a diffuse disc bulge, degenerative endplate change, and bilateral facet arthropathy resulting in severe left worse than right with impingement of the exiting L5 nerve roots. IMPRESSION: CERVICAL SPINE MRI: 1. Evaluation of the neural foramina is suboptimal due to poor signal on the axial T2 images. Within this confine: 2. Multilevel degenerative changes throughout the cervical spine as detailed above resulting in up to mild-to-moderate spinal canal stenosis at C4-C5 and C6-C7 and severe bilateral neural foraminal stenosis at C3-C4 through C6-C7. 3. No evidence of cord compression or cord signal abnormality. THORACIC SPINE MRI: 1. Large disc protrusion/extrusion at T10-T11 resulting in moderate spinal canal stenosis with mild indentation of the cord and mild bilateral neural foraminal stenosis. 2. Numerous additional disc protrusions throughout the remainder of the thoracic spine as detailed above without other high-grade spinal canal stenosis. No high-grade neural foraminal  stenosis or evidence of nerve root impingement in the thoracic spine. 3. 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 4. No cord signal abnormality. LUMBAR SPINE MRI: 1. Postsurgical changes reflecting posterior decompression at L1-L2 through L3-L4. No high-grade spinal canal stenosis at the surgical levels, but there is effacement of the left subarticular zone at L1-L2 with possible impingement of the traversing left L2 nerve root. 2.  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 3. Severe left worse than right neural foraminal stenosis at L5-S1 with impingement of the exiting L5 nerve roots. 4. Stepwise grade 1 retrolisthesis of L1 on L2 through L4 on L5, likely degenerative in nature. 5. Fusion of the L1 through L3 vertebral bodies. 6. Severe facet arthropathy at L4-L5 and L5-S1, slightly worse on the right, with associated trace facet joint effusions. Electronically Signed   By: Valetta Mole M.D.   On: 04/11/2021 17:37   VAS [place] ABI WITH/WO TBI  Result Date: 04/17/2021  LOWER EXTREMITY DOPPLER STUDY Patient Name:  Tony Mcintosh  Date of Exam:   04/17/2021 Medical Rec #: [id number]           Accession #:    [id number] Date of Birth: [birth year]           Patient Gender: M Patient Age:   28 years Exam Location:  [facility] Procedure:      VAS [place] ABI WITH/WO TBI Referring Phys: Eulogio Bear --------------------------------------------------------------------------------  Indications: Claudication.  Comparison Study: no prior Performing Technologist: Archie Patten RVS  Examination Guidelines: A complete evaluation includes at minimum, Doppler waveform signals and systolic blood pressure reading at the level of bilateral brachial, anterior tibial, and posterior tibial arteries, when vessel segments are accessible. Bilateral testing is considered an integral part of a complete examination. Photoelectric Plethysmograph (PPG) waveforms and toe systolic pressure readings are included as required and additional duplex testing as needed. Limited examinations for reoccurring indications may be performed as noted.  ABI Findings: +---------+------------------+-----+---------+--------+  Right     Rt Pressure (mmHg) Index Waveform  Comment    +---------+------------------+-----+---------+--------+  Brachial  160                      triphasic           +---------+------------------+-----+---------+--------+  PTA       183                1.14  triphasic           +---------+------------------+-----+---------+--------+  DP        207                1.29  triphasic           +---------+------------------+-----+---------+--------+  Great Toe 123                0.77  Normal              +---------+------------------+-----+---------+--------+ +---------+------------------+-----+---------+-------+  Left      Lt Pressure (mmHg) Index Waveform  Comment  +---------+------------------+-----+---------+-------+  Brachial  147                      triphasic          +---------+------------------+-----+---------+-------+  PTA  204                1.27  triphasic          +---------+------------------+-----+---------+-------+  DP        196                1.23  triphasic          +---------+------------------+-----+---------+-------+  Great Toe 123                0.77  Normal             +---------+------------------+-----+---------+-------+ +-------+-----------+-----------+------------+------------+  ABI/TBI Today's ABI Today's TBI Previous ABI Previous TBI  +-------+-----------+-----------+------------+------------+  Right   1.29        0.77                                   +-------+-----------+-----------+------------+------------+  Left    1.27        0.77                                   +-------+-----------+-----------+------------+------------+  Summary: Right: Resting right ankle-brachial index is within normal range. No evidence of significant right lower extremity arterial disease. The right toe-brachial index is normal. Left: Resting left ankle-brachial index is within normal range. No evidence of significant left lower extremity arterial disease. The left toe-brachial index is normal.  *See table(s) above for measurements and observations.   Electronically signed by Deitra Mayo MD on 04/17/2021 at 3:23:07 PM.    Final    DG FL GUIDED LUMBAR PUNCTURE  Result Date: 04/12/2021 CLINICAL DATA:  Numbness of both lower extremities. EXAM: DIAGNOSTIC LUMBAR PUNCTURE UNDER FLUOROSCOPIC GUIDANCE COMPARISON:  MRI of the lumbar spine 04/11/2021. FLUOROSCOPY TIME:  Fluoroscopy Time:  36 seconds Radiation Exposure Index (if provided by the fluoroscopic device): 18.40 mGy Number of Acquired Spot Images: 1 PROCEDURE: Informed consent was obtained from the patient prior to the procedure, which included a discussion of potential procedure risks including but not limited to headache, allergic reaction, pain, bleeding, infection and spinal cord/nerve root injury. With the patient prone, the lower back was prepped with Betadine. 1% Lidocaine was used for local anesthesia. Lumbar puncture was performed via the L3 laminectomy defect using a 20 gauge needle with return of clear CSF with an opening pressure of 12.5 cm water. 12 ml of CSF were obtained for laboratory studies. The patient tolerated the procedure well. No immediate post-procedure complication was apparent. IMPRESSION: Technically successful fluoroscopically-guided lumbar puncture via an L3 laminectomy defect. 12 mL CSF obtained for laboratory studies. No immediate post-procedure complication. Electronically Signed   By: Kellie Simmering D.O.   On: 04/12/2021 10:59    Labs:  Basic Metabolic Panel: Recent Labs  Lab 05/03/21 0522 05/07/21 0637  NA 132* 135  K 3.9 4.0  CL 97* 101  CO2 26 26  GLUCOSE 107* 106*  BUN 13 15  CREATININE 0.90 0.99  CALCIUM 9.1 8.8*    CBC: Recent Labs  Lab 05/02/21 1314 05/07/21 0637  WBC 7.8 6.1  NEUTROABS 6.4 4.1  HGB 13.7 13.0  HCT 41.1 38.2*  MCV 94.7 93.9  PLT 266 284    CBG: No results for input(s): GLUCAP in the last 168 hours.  Brief HPI:   Tony Mcintosh is a 76 y.o. male who presented to the  emergency department on 04/16/2021 with sudden  onset of extremity numbness and loss of bowel and bladder control.  His past medical history was significant for chronic back pain and back surgery in 2011.  MRI of the spine was performed and neurosurgery consulted.  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  There were no indications for urgent/emergent operative decompression of the spine.  Neurology consultation was obtained.  Fluoroscopic guided lumbar puncture performed on 04/12/2021.  Were compatible with Guillain-Barr syndrome.  The patient received IVIG for 5 days.   Hospital Course: Tony Mcintosh was admitted to rehab 04/20/2021 for inpatient therapies to consist of PT, ST and OT at least three hours five days a week. Past admission physiatrist, therapy team and rehab RN have worked together to provide customized collaborative inpatient rehab.He complained of left knee pain and ice was applied. Left thumb and right hand pain noted on 1/23 and prednisone increased to 5 mg. Kpad for back pain.  Orthopedic surgery consulted for knee pain, however pain improved without intervention. Ice and Voltaren gel applied. Left knee xrays c/w degenerative changes.   Elevated LFTs noted and followed. Normalized on 1/21, but elevated again on 1/30, trending downward on 2/2.  Messaged patient's rheumatologist regarding resumption of methotrexate which patient usually takes every Friday. OK to resume, patient will have to follow-up with Dr. Krystal Clark for refill. Prednisone dose increased to baseline dose of 10 mg daily.Thrombocytopenia noted 1/23 without evidence of bleeding and stable H and H.   Remained continent of bowels and bladder. No further loose stools for greater than 48 hours and was taken of enteric precautions on 1/24. Reported improvement in overall sleep and in hand pain on 1/26.   Regimen for upper extremity pain also included tramadol which was not effective and changed to Norco. Right  shoulder pain noted 2/1. Discussed with Dr. Lajoyce Corners. Prednisone increased to 20 mg daily without improvement. Ice therapy and lidocaine/steroid injection on 2/2 with much improvement on 2/3, therefore Prednisone reduced back to 10 mg daily.   Spasmodic pain controlled on Robaxin 1000 mg given at 3 pm and 9 pm.   Blood pressures were monitored on TID basis and remained stable.  Rehab course: During patient's stay in rehab weekly team conferences were held to monitor patient's progress, set goals and discuss barriers to discharge. At admission, patient required min to mod assist with transfers, standing, side-stepping.  He has had improvement in activity tolerance, balance, postural control as well as ability to compensate for deficits. He has had improvement in functional use RUE/LUE  and RLE/LLE as well as improvement in awareness       Disposition:  Discharge disposition: 01-Home or Self Care      Diet: Heart healthy  Special Instructions:  No driving, alcohol consumption or tobacco use.    30-35 minutes were spent on discharge planning and discharge summary.  Discharge Instructions     Ambulatory referral to Physical Medicine Rehab   Complete by: As directed    Follow-up as per Dr.Lovorn asap. Hospital follow-up for GBS   Discharge patient   Complete by: As directed    Discharge disposition: 01-Home or Self Care   Discharge patient date: 05/09/2021      Allergies as of 05/09/2021   No Known Allergies      Medication List     STOP taking these medications    calcium carbonate 500 MG chewable tablet Commonly known as: TUMS -  dosed in mg elemental calcium   Vitamin D3 1.25 MG (50000 UT) Caps       TAKE these medications    acetaminophen 325 MG tablet Commonly known as: TYLENOL Take 1-2 tablets (325-650 mg total) by mouth every 4 (four) hours as needed for mild pain.   acidophilus Caps capsule Take 1 capsule by mouth daily.   b complex vitamins  capsule Take 1 capsule by mouth 2 (two) times daily.   bisacodyl 5 MG EC tablet Commonly known as: DULCOLAX Take 1 tablet (5 mg total) by mouth daily as needed for moderate constipation.   calcium carbonate 1250 (500 Ca) MG tablet Commonly known as: OS-CAL - dosed in mg of elemental calcium Take 1 tablet (500 mg of elemental calcium total) by mouth 2 (two) times daily with a meal.   diclofenac Sodium 1 % Gel Commonly known as: VOLTAREN Apply 2 g topically 4 (four) times daily.   folic acid 1 MG tablet Commonly known as: FOLVITE Take 1 tablet (1 mg total) by mouth daily.   gabapentin 300 MG capsule Commonly known as: NEURONTIN Take 300 mg by mouth at bedtime.   HYDROcodone-acetaminophen 5-325 MG tablet Commonly known as: NORCO/VICODIN Take 1 tablet by mouth every 6 (six) hours as needed for moderate pain.   magnesium gluconate 500 MG tablet Commonly known as: MAGONATE Take 0.5 tablets (250 mg total) by mouth at bedtime.   Methocarbamol 1000 MG Tabs Take 1,000 mg by mouth 4 (four) times daily as needed for muscle spasms. Take one tablet in the morning and one in the evening as in the hospital. Additional doses twice a day as needed. Wean as tolerated.   methotrexate 2.5 MG tablet Commonly known as: RHEUMATREX Take 7 tablets by mouth once a week.   pramipexole 1 MG tablet Commonly known as: MIRAPEX Take 1 tablet by mouth 2 (two) times daily.   predniSONE 10 MG tablet Commonly known as: DELTASONE Take 1 tablet (10 mg total) by mouth daily. Wean back to your 5 mg dose as able What changed:  medication strength how much to take additional instructions   tamsulosin 0.4 MG Caps capsule Commonly known as: FLOMAX Take 2 capsules (0.8 mg total) by mouth daily after supper. May substitute two of your previously prescribed 0.4 mg tablets once daily What changed:  how much to take when to take this additional instructions   traZODone 50 MG tablet Commonly known as:  DESYREL Take 1 tablet (50 mg total) by mouth at bedtime.   Vibegron 75 MG Tabs Take 75 mg by mouth daily.   Vitamin D (Ergocalciferol) 1.25 MG (50000 UNIT) Caps capsule Commonly known as: DRISDOL Take 1 capsule (50,000 Units total) by mouth every 7 (seven) days. Start taking on: May 11, 2021        Follow-up Information     Dr. Cristal Deer. Go to.   Why: your previously scheduled appointment on April 21st, 2023 Contact information: Rheumotologist        Dr. Edwin Dada. Call.   Why: Call on Thursday for hospital follow-up Contact information: Urology        Dr.Ivaylo [facility]. Call.   Why: Call on Thursday to arrange hospital follow-up appointment Contact information: neurologist        Lovorn, Jinny Blossom, MD Follow up.   Specialty: Physical Medicine and Rehabilitation Why: office will call you to arrange your appt (sent) Hospital follow-up for GBS Contact information: [id number] [address] [place] [address] [phone]  [facility], [employer]. Call.   Why: Call on thursday to arrange hospital follow-up. Take discharge packet with you. Contact information: [place] [address] [phone]                 Signed: Barbie Banner 05/09/2021, 9:54 AM

## 2021-04-20 NOTE — Progress Notes (Addendum)
Patient evaluated.  Left knee pain improved since aspiration last night but not resolved.  Still has not been able to put weight on it.  Denies any fevers, chills, night sweats.  Effusion has reaccumulated.  Effusion aspirated again of about 40 cc.  This will be sent off for synovial fluid analysis with crystal analysis, gram stain, culture again.  Toradol injection (1 cc Toradol with 4 cc 0.25% Marcaine without epi) administered for symptomatic relief.  Holding off on cortisone injection.  Fluid did not appear purulent.  Continue with discharge to [facility].  Will monitor lab results but no indication for surgery at this time.  Plan on reevaluation at CIR in a week if he still having knee pain and effusion.

## 2021-04-20 NOTE — Progress Notes (Signed)
Physical Therapy Treatment Patient Details Name: Tony Mcintosh MRN: [id number] DOB: [birth year] Today's Date: 04/20/2021   History of Present Illness Tony Mcintosh is a 76 y.o. male with medical history significant for lumbar spinal stenosis, peripheral polyneuropathy, chronic back pain, restless leg syndrome, BPH, who is admitted to [facility] on 04/11/2021 for further evaluation and management of presenting numbness/weakness involving the bilateral lower extremities. 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 L knee aspiration 02/17/22.    PT Comments    Patient reports continued pain and discomfort/swelling in left knee after aspiration. Requires assist for bed mobility and needs more assist today due to inability to pull with left hand secondary to hurting thumb. Pt unable to stand fully upright using stedy due to left knee pain and left thumb pain. Worked on AROM of left knee however noted to have limited knee flexion/extension and muscle spasms preventing further range as well as pain. Pt continues to be motivated to work with therapies. Will follow.   Recommendations for follow up therapy are one component of a multi-disciplinary discharge planning process, led by the attending physician.  Recommendations may be updated based on patient status, additional functional criteria and insurance authorization.  Follow Up Recommendations  Acute inpatient rehab (3hours/day)     Assistance Recommended at Discharge Frequent or constant Supervision/Assistance  Patient can return home with the following Help with stairs or ramp for entrance;A lot of help with walking and/or transfers;Two people to help with walking and/or transfers;Assistance with cooking/housework;Assist for transportation   Equipment Recommendations  Rolling walker (2 wheels)    Recommendations for Other Services       Precautions / Restrictions Precautions Precautions: Fall Restrictions Weight Bearing  Restrictions: No     Mobility  Bed Mobility Overal bed mobility: Needs Assistance Bed Mobility: Supine to Sit, Sit to Supine, Rolling Rolling: Mod assist, +2 for physical assistance   Supine to sit: +2 for physical assistance, Mod assist Sit to supine: Mod assist, +2 for physical assistance   General bed mobility comments: assist for L LE over EOB, hips to EOB and to raise trunk, returned to sidelying with assist to guide trunk onto his side and raise B LEs into bed for placement of bed pan, assist from sidelying to supine to position on bed pan    Transfers Overall transfer level: Needs assistance Equipment used: Ambulation equipment used Transfers: Sit to/from Stand Sit to Stand: +2 physical assistance, Min assist, From elevated surface           General transfer comment: from heightened bed x 1, pt with flexed trunk- unable to stand fully upright or extend hips due to leftt knee weakness/pain and left thumb pain Transfer via Lift Equipment: Stedy  Ambulation/Gait               General Gait Details: Unable today   Stairs             Wheelchair Mobility    Modified Rankin (Stroke Patients Only)       Balance Overall balance assessment: Needs assistance Sitting-balance support: Feet supported, No upper extremity supported Sitting balance-Leahy Scale: Fair Sitting balance - Comments: Does better with UE support when feet off floor   Standing balance support: During functional activity Standing balance-Leahy Scale: Zero Standing balance comment: unable to release walker or stedy in static standing  Cognition Arousal/Alertness: Awake/alert Behavior During Therapy: WFL for tasks assessed/performed Overall Cognitive Status: Within Functional Limits for tasks assessed                                 General Comments: pt well aware of his limits        Exercises General Exercises - Lower  Extremity Ankle Circles/Pumps: AROM, Both, Supine, 10 reps Long Arc Quad: AAROM, Left, 5 reps, Seated Other Exercises Other Exercises: worked on LLe AROM at knee joint- spasms before getting to full extension and flexion- painful    General Comments General comments (skin integrity, edema, etc.): Son present but stepped out during session.      Pertinent Vitals/Pain Pain Assessment Pain Assessment: Faces Faces Pain Scale: Hurts even more Pain Location: L knee, L thumb Pain Descriptors / Indicators: Spasm, Grimacing, Guarding, Discomfort, Sore Pain Intervention(s): Monitored during session, Premedicated before session, Repositioned    Home Living                          Prior Function            PT Goals (current goals can now be found in the care plan section) Progress towards PT goals: Not progressing toward goals - comment (pain and weakness)    Frequency    Min 3X/week      PT Plan Current plan remains appropriate    Co-evaluation PT/OT/SLP Co-Evaluation/Treatment: Yes Reason for Co-Treatment: For patient/therapist safety;Complexity of the patient's impairments (multi-system involvement) PT goals addressed during session: Mobility/safety with mobility OT goals addressed during session: Strengthening/ROM      AM-PAC PT "6 Clicks" Mobility   Outcome Measure  Help needed turning from your back to your side while in a flat bed without using bedrails?: A Lot Help needed moving from lying on your back to sitting on the side of a flat bed without using bedrails?: A Lot Help needed moving to and from a bed to a chair (including a wheelchair)?: Total Help needed standing up from a chair using your arms (e.g., wheelchair or bedside chair)?: Total Help needed to walk in hospital room?: Total Help needed climbing 3-5 steps with a railing? : Total 6 Click Score: 8    End of Session   Activity Tolerance: Patient limited by pain Patient left: in bed;with  call bell/phone within reach;Other (comment) (bedpan) Nurse Communication: Mobility status;Other (comment) (pt on bedpan) PT Visit Diagnosis: Muscle weakness (generalized) (M62.81);Other abnormalities of gait and mobility (R26.89);Unsteadiness on feet (R26.81);Difficulty in walking, not elsewhere classified (R26.2)     Time: 8177-1165 PT Time Calculation (min) (ACUTE ONLY): 33 min  Charges:  $Therapeutic Activity: 8-22 mins                     Vale Haven, PT, DPT Acute Rehabilitation Services Pager [phone] Office [phone]      Blake Divine A Lanier Ensign 04/20/2021, 12:49 PM
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
## 2021-04-21 DIAGNOSIS — G610 Guillain-Barre syndrome: Secondary | ICD-10-CM

## 2021-04-21 LAB — CBC WITH DIFFERENTIAL/PLATELET
Abs Immature Granulocytes: 0.02 K/uL (ref 0.00–0.07)
Basophils Absolute: 0 K/uL (ref 0.0–0.1)
Basophils Relative: 1 %
Eosinophils Absolute: 0.1 K/uL (ref 0.0–0.5)
Eosinophils Relative: 2 %
HCT: 38.5 % — ABNORMAL LOW (ref 39.0–52.0)
Hemoglobin: 13.5 g/dL (ref 13.0–17.0)
Immature Granulocytes: 0 %
Lymphocytes Relative: 22 %
Lymphs Abs: 1.1 K/uL (ref 0.7–4.0)
MCH: 32.5 pg (ref 26.0–34.0)
MCHC: 35.1 g/dL (ref 30.0–36.0)
MCV: 92.5 fL (ref 80.0–100.0)
Monocytes Absolute: 1 K/uL (ref 0.1–1.0)
Monocytes Relative: 21 %
Neutro Abs: 2.7 K/uL (ref 1.7–7.7)
Neutrophils Relative %: 54 %
Platelets: 136 K/uL — ABNORMAL LOW (ref 150–400)
RBC: 4.16 MIL/uL — ABNORMAL LOW (ref 4.22–5.81)
RDW: 13.5 % (ref 11.5–15.5)
WBC: 4.9 K/uL (ref 4.0–10.5)
nRBC: 0 % (ref 0.0–0.2)

## 2021-04-21 LAB — HEMOGLOBIN A1C
Hgb A1c MFr Bld: 6.5 % — ABNORMAL HIGH (ref 4.8–5.6)
Mean Plasma Glucose: 140 mg/dL

## 2021-04-21 LAB — COMPREHENSIVE METABOLIC PANEL WITH GFR
ALT: 25 U/L (ref 0–44)
AST: 22 U/L (ref 15–41)
Albumin: 2.7 g/dL — ABNORMAL LOW (ref 3.5–5.0)
Alkaline Phosphatase: 48 U/L (ref 38–126)
Anion gap: 8 (ref 5–15)
BUN: 20 mg/dL (ref 8–23)
CO2: 23 mmol/L (ref 22–32)
Calcium: 8.4 mg/dL — ABNORMAL LOW (ref 8.9–10.3)
Chloride: 101 mmol/L (ref 98–111)
Creatinine, Ser: 0.92 mg/dL (ref 0.61–1.24)
GFR, Estimated: >60 mL/min
Glucose, Bld: 105 mg/dL — ABNORMAL HIGH (ref 70–99)
Potassium: 3.9 mmol/L (ref 3.5–5.1)
Sodium: 132 mmol/L — ABNORMAL LOW (ref 135–145)
Total Bilirubin: 0.7 mg/dL (ref 0.3–1.2)
Total Protein: 8.4 g/dL — ABNORMAL HIGH (ref 6.5–8.1)

## 2021-04-21 MED ORDER — DICLOFENAC SODIUM 1 % EX GEL
2.0000 g | Freq: Four times a day (QID) | CUTANEOUS | Status: DC
  Administered 2021-04-21 – 2021-05-08 (×41): 2 g via TOPICAL
  Filled 2021-04-21 (×2): qty 100

## 2021-04-21 MED ORDER — TAMSULOSIN HCL 0.4 MG PO CAPS
0.8000 mg | ORAL_CAPSULE | Freq: Every day | ORAL | Status: DC
  Administered 2021-04-21 – 2021-05-08 (×18): 0.8 mg via ORAL
  Filled 2021-04-21 (×17): qty 2

## 2021-04-21 NOTE — Progress Notes (Signed)
Pt has own CPAP at bedside. Pt on RA no resp distress noted at this time. PT states that he does not need help with home cpap, but will call if he needs to.
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
## 2021-04-21 NOTE — Progress Notes (Signed)
Patient right forearm IV removed and ordered by MD. No signs or symptoms of bleeding or trauma noted. Patient tolerated well. Patient in bed. Bed in lowest position with bed alarm activated. Wife at bedside. Cletis Media, LPN

## 2021-04-21 NOTE — Progress Notes (Signed)
PROGRESS NOTE   Subjective/Complaints: Continues to have left knee pain though mobility has greatly improved. Fluid has reaccummulated- messaged orthopedic to PA to see if he can reaspirate this weekend His hand joint pain is worsening.   ROS: +left knee pain  Objective:   DG Knee 1-2 Views Left  Result Date: 04/19/2021 CLINICAL DATA:  LEFT knee pain, limited range of motion, no known injury EXAM: LEFT KNEE - 1-2 VIEW COMPARISON:  None FINDINGS: Osseous mineralization low normal. Scattered joint space narrowing and spur formation greatest at patellofemoral joint. No acute fracture, dislocation, or bone destruction. Trace joint effusion. 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 Electronically Signed   By: Lavonia Dana M.D.   On: 04/19/2021 14:12   Recent Labs    04/21/21 0538  WBC 4.9  HGB 13.5  HCT 38.5*  PLT 136*   Recent Labs    04/20/21 1639 04/21/21 0538  NA  --  132*  K  --  3.9  CL  --  101  CO2  --  23  GLUCOSE  --  105*  BUN  --  20  CREATININE 0.96 0.92  CALCIUM  --  8.4*    Intake/Output Summary (Last 24 hours) at 04/21/2021 1350 Last data filed at 04/21/2021 0942 Gross per 24 hour  Intake 100 ml  Output 750 ml  Net -650 ml        Physical Exam: Vital Signs Blood pressure 136/82, pulse 70, temperature 98.2 F (36.8 C), temperature source Oral, resp. rate 20, height 5\' 11"  (1.803 m), weight 136 kg, SpO2 92 %. Gen: no distress, normal appearing HEENT: oral mucosa pink and moist, NCAT Cardio: Reg rate Chest: normal effort, normal rate of breathing Abdominal:     Comments: Obese, non-distended  Musculoskeletal:     Cervical back: Rigidity present. Right knee swollen, erythematous, and tender to palpation 5/5 strength with the exception of LLE 4/5- greatly improved as knee swelling has decreased. Left thumb also tender to palpation.   Neurological:     Mental Status: He is alert and oriented to person, place, and time.  Psychiatric:        Mood and Affect: Mood normal.        Thought Content: Thought content normal.      Assessment/Plan: 1. Functional deficits which require 3+ hours per day of interdisciplinary therapy in a comprehensive inpatient rehab setting. Physiatrist is providing close team supervision and 24 hour management of active medical problems listed below. Physiatrist and rehab team continue to assess barriers to discharge/monitor patient progress toward functional and medical goals  Care Tool:  Bathing  Bathing activity did not occur: Refused           Bathing assist       Upper Body Dressing/Undressing Upper body dressing Upper body dressing/undressing activity did not occur (including orthotics): Refused (pt reported he had just changed shirt prior to evaluation)      Upper body assist      Lower Body Dressing/Undressing Lower body dressing    Lower body dressing activity did not occur: N/A What is the patient wearing?: Pants  Lower body assist Assist for lower body dressing: Total Assistance - Patient < 25%     Toileting Toileting Toileting Activity did not occur Landscape architect and hygiene only): Refused  Toileting assist       Transfers Chair/bed transfer  Transfers assist  Chair/bed transfer activity did not occur: Refused  Chair/bed transfer assist level: Dependent - mechanical lift (Stedy)     Locomotion Ambulation   Ambulation assist   Ambulation activity did not occur: Safety/medical concerns (Fatigue, pain)          Walk 10 feet activity   Assist  Walk 10 feet activity did not occur: Safety/medical concerns (Fatigue, pain)        Walk 50 feet activity   Assist Walk 50 feet with 2 turns activity did not occur: Safety/medical concerns (Fatigue, pain)         Walk 150 feet activity   Assist Walk 150 feet activity did not  occur: Safety/medical concerns (Fatigue, pain)         Walk 10 feet on uneven surface  activity   Assist Walk 10 feet on uneven surfaces activity did not occur: Safety/medical concerns (Fatigue, pain)         Wheelchair     Assist Is the patient using a wheelchair?: Yes Type of Wheelchair: Manual    Wheelchair assist level: Dependent - Patient 0%      Wheelchair 50 feet with 2 turns activity    Assist        Assist Level: Dependent - Patient 0%   Wheelchair 150 feet activity     Assist      Assist Level: Dependent - Patient 0%   Blood pressure 136/82, pulse 70, temperature 98.2 F (36.8 C), temperature source Oral, resp. rate 20, height 5\' 11"  (1.803 m), weight 136 kg, SpO2 92 %.    Medical Problem List and Plan: 1. 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 IVIG x 5 days completed.             -patient may shower             -ELOS/Goals: minA 14-18 days             Admit to CIR 2.  Antithrombotics: -DVT/anticoagulation:  Pharmaceutical: Lovenox             -antiplatelet therapy: none 3. Left thumb pain: Ice 15 minutes TID. Tylenol. Spreading to right hand as well- appears to be his rheumatoid arthritis. Added voltaren gel.  4. Mood: LCSW to evlauate and provide emotional support             -antipsychotic agents: n/a 5. Neuropsych: This patient is capable of making decisions on his own behalf. 6. Skin/Wound Care: Routine skin checks 7. Fluids/Electrolytes/Nutrition: Routine ins and outs and follow-up chemistries 8.  Peripheral polyneuropathy: Continue gabapentin 9.  Obstructive sleep apnea: Continue CPAP at night time and while napping 10: Morbid obesity: Discussed benefits of Mediterranean diet  11: Restless leg syndrome: Continue Mirapex 12: Benign prostatic hypertrophy: Continue Flomax 13: Muscle spasm: Robaxin prn  (was getting Valium on acute side) 14: Vitamin B12 deficiency: continue oral supplement  15: Arthritis flare both hands: started on prednisone then weaned off; methotrexate home med held 16: Overactive bladder: continue Vibegron 17: Loose stools versus constipation: continue to monitor>>Imodium PRN/anti-constipation measures 18: History of Vitamin D deficiency: check serum level 19: Mild thrombocytopenia: follow-up  CBC 20. Left knee pain: ice 15 minutes TID. Messaged ortho PA to see if he can get knee aspiration again this weekend.  21. Low back pain, chronic: add kpad.  LOS: 1 days A FACE TO FACE EVALUATION WAS PERFORMED  Ernesta Trabert P Jaria Conway 04/21/2021, 1:50 PM

## 2021-04-21 NOTE — Plan of Care (Signed)
°  Problem: RH Balance Goal: LTG Patient will maintain dynamic sitting balance (PT) Description: LTG:  Patient will maintain dynamic sitting balance with assistance during mobility activities (PT) Flowsheets (Taken 04/21/2021 1218) LTG: Pt will maintain dynamic sitting balance during mobility activities with:: Independent Goal: LTG Patient will maintain dynamic standing balance (PT) Description: LTG:  Patient will maintain dynamic standing balance with assistance during mobility activities (PT) Flowsheets (Taken 04/21/2021 1218) LTG: Pt will maintain dynamic standing balance during mobility activities with:: (LRAD) Minimal Assistance - Patient > 75%   Problem: Sit to Stand Goal: LTG:  Patient will perform sit to stand with assistance level (PT) Description: LTG:  Patient will perform sit to stand with assistance level (PT) Flowsheets (Taken 04/21/2021 1218) LTG: PT will perform sit to stand in preparation for functional mobility with assistance level: (LRAD) Minimal Assistance - Patient > 75%   Problem: RH Bed to Chair Transfers Goal: LTG Patient will perform bed/chair transfers w/assist (PT) Description: LTG: Patient will perform bed to chair transfers with assistance (PT). Flowsheets (Taken 04/21/2021 1218) LTG: Pt will perform Bed to Chair Transfers with assistance level: (LRAD) Minimal Assistance - Patient > 75%   Problem: RH Car Transfers Goal: LTG Patient will perform car transfers with assist (PT) Description: LTG: Patient will perform car transfers with assistance (PT). Flowsheets (Taken 04/21/2021 1218) LTG: Pt will perform car transfers with assist:: (LRAD) Minimal Assistance - Patient > 75%   Problem: RH Ambulation Goal: LTG Patient will ambulate in home environment (PT) Description: LTG: Patient will ambulate in home environment, # of feet with assistance (PT). Flowsheets (Taken 04/21/2021 1218) LTG: Pt will ambulate in home environ  assist needed:: (LRAD) Supervision/Verbal  cueing LTG: Ambulation distance in home environment: 50'   Problem: RH Wheelchair Mobility Goal: LTG Patient will propel w/c in controlled environment (PT) Description: LTG: Patient will propel wheelchair in controlled environment, # of feet with assist (PT) Flowsheets (Taken 04/21/2021 1218) LTG: Pt will propel w/c in controlled environ  assist needed:: Set up assist LTG: Propel w/c distance in controlled environment: 150'
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
## 2021-04-21 NOTE — Plan of Care (Signed)
°  Problem: Consults °Goal: RH SPINAL CORD INJURY PATIENT EDUCATION °Description:  See Patient Education module for education specifics.  °Outcome: Progressing °  °Problem: SCI BOWEL ELIMINATION °Goal: RH STG MANAGE BOWEL WITH ASSISTANCE °Description: STG Manage Bowel with mod I Assistance. °Outcome: Progressing °Goal: RH STG SCI MANAGE BOWEL WITH MEDICATION WITH ASSISTANCE °Description: STG SCI Manage bowel with medication with mod I assistance. °Outcome: Progressing °  °Problem: SCI BLADDER ELIMINATION °Goal: RH STG MANAGE BLADDER WITH ASSISTANCE °Description: STG Manage Bladder With min Assistance °Outcome: Progressing °Goal: RH STG MANAGE BLADDER WITH MEDICATION WITH ASSISTANCE °Description: STG Manage Bladder With Medication With mod I Assistance. °Outcome: Progressing °  °Problem: RH SAFETY °Goal: RH STG ADHERE TO SAFETY PRECAUTIONS W/ASSISTANCE/DEVICE °Description: STG Adhere to Safety Precautions With cues Assistance/Device. °Outcome: Progressing °  °Problem: RH PAIN MANAGEMENT °Goal: RH STG PAIN MANAGED AT OR BELOW PT'S PAIN GOAL °Description: At or below level 4 with prns °Outcome: Progressing °  °Problem: RH KNOWLEDGE DEFICIT SCI °Goal: RH STG INCREASE KNOWLEDGE OF SELF CARE AFTER SCI °Description: Patient and wife will be able to manage care at discharge using handouts and educational materials independently °Outcome: Progressing °  °

## 2021-04-21 NOTE — Progress Notes (Signed)
Physical Therapy Session Note  Patient Details  Name: Tony Mcintosh MRN: [id number] Date of Birth: 1945-12-26  Today's Date: 04/21/2021 PT Individual Time: 1305-1400 PT Individual Time Calculation (min): 55 min   Short Term Goals: Week 1:  PT Short Term Goal 1 (Week 1): Pt will initiate gait training w/LRAD and max A PT Short Term Goal 2 (Week 1): Pt will perform sit <>stands w/LRAD and mod A PT Short Term Goal 3 (Week 1): Pt will perform bed <>chair transfers w/LRAD and max A  Skilled Therapeutic Interventions/Progress Updates:  Pt received supine in bed, reported 8/10 in bilateral hands and had recently been medicated. Offered heat and distraction throughout session for pain modulation. Emphasis of session on sit <>stand transfers using NDT principles. Pt performed supine <>sit EOB slowly w/CGA and required mod encouragement to try to stand w/RW. Elevated bed slightly until only pt's toes were on ground (per his request) and attempted 3 sit <>stands w/max A to RW. Pt demonstrated hyperextension of B knees and climbing up on RW to achieve trunk extension. Educated pt on biomechanics of sit <>stand transfer using visual demonstration and NDT principles. Regressed to blocked part-practice of step 1 of sit <>stand to facilitate anterior weight shift and shifting COG from buttocks to BLEs. Pt practiced 10 times w/min A and achieved increased hip clearance w/each attempt. Pt insistent upon hyperextending knees prior to extending trunk, which therapist explained to pt will add excess strain to low back and B hands. Educated pt on proper biomechanics and rationale behind them, pt verbalized understanding. Pt was left seated EOB in room, wife present, all needs in reach.   Therapy Documentation Precautions:  Restrictions Weight Bearing Restrictions: No   Therapy/Group: Individual Therapy Cruzita Lederer Tyeshia Cornforth, PT, DPT  04/21/2021, 7:57 AM

## 2021-04-22 MED ORDER — PREDNISONE 10 MG PO TABS
5.0000 mg | ORAL_TABLET | Freq: Every day | ORAL | Status: DC
  Administered 2021-04-23 – 2021-04-24 (×2): 5 mg via ORAL
  Filled 2021-04-22 (×2): qty 1

## 2021-04-22 MED ORDER — MAGNESIUM GLUCONATE 500 MG PO TABS
250.0000 mg | ORAL_TABLET | Freq: Every day | ORAL | Status: DC
  Administered 2021-04-22 – 2021-05-08 (×17): 250 mg via ORAL
  Filled 2021-04-22 (×17): qty 1

## 2021-04-22 MED ORDER — BISACODYL 5 MG PO TBEC
5.0000 mg | DELAYED_RELEASE_TABLET | Freq: Every day | ORAL | Status: DC | PRN
  Administered 2021-05-06: 5 mg via ORAL
  Filled 2021-04-22: qty 1

## 2021-04-22 NOTE — Progress Notes (Signed)
Pt son angry that nurse had not responded to pt uncontrollable shaking. This nurse interrupted care with other pt to assess this pt Son stated " He wouldn't be shaking like this if his muscle relaxer was given on time, it was due at 9 o clock, its 9:45 now!" Educated pt and family about PRN meds and reminded pt that he said he wanted his medication closer to 10 p.m. Pt seemed anxious due to son being upset. Pt began stuttering, was shaking and slightly labored breathing. Pt was able to state where he was, month, city, and hospital. Pt arms were symmetrical, face symmetrical. 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 Pt also stated he was too weak to take medications, that this nurse needed to hand feed to pt. Pt was able to take tylenol and drink from water without assistance from nurse but required nurse to put them in mouth one at a time and bring water to mouth for other medications. Pt has history of anxiety and spoke to pt about breathing exercises to calm patient.   Checked on pt at 22:30 and pt was calm, no tremors, no labored breathing, no stuttering. Bed alarm on and in lowest position, Pt needs have been met, call light within reach. Will continue to monitor.

## 2021-04-22 NOTE — Progress Notes (Signed)
PROGRESS NOTE   Subjective/Complaints: Left knee pain resolved, pain is currently worse in joints of both hands. Asks if prednisone can be increased further and have increased to 5mg . Can consult rheumatology tomorrow regarding potential treatment options  ROS: +left knee pain- resolved  Objective:   No results found. Recent Labs    04/21/21 0538  WBC 4.9  HGB 13.5  HCT 38.5*  PLT 136*   Recent Labs    04/20/21 1639 04/21/21 0538  NA  --  132*  K  --  3.9  CL  --  101  CO2  --  23  GLUCOSE  --  105*  BUN  --  20  CREATININE 0.96 0.92  CALCIUM  --  8.4*    Intake/Output Summary (Last 24 hours) at 04/22/2021 1438 Last data filed at 04/22/2021 1351 Gross per 24 hour  Intake 474 ml  Output 1300 ml  Net -826 ml        Physical Exam: Vital Signs Blood pressure (!) 144/95, pulse 81, temperature 98.2 F (36.8 C), resp. rate 15, height 5\' 11"  (1.803 m), weight 136 kg, SpO2 97 %. Gen: no distress, normal appearing HEENT: oral mucosa pink and moist, NCAT Cardio: Reg rate Chest: normal effort, normal rate of breathing  Abdominal:     Comments: Obese, non-distended  Musculoskeletal:     Cervical back: Rigidity present. Right knee swollen, erythematous, and tender to palpation 5/5 strength with the exception of LLE 4/5- greatly improved as knee swelling has decreased. Left thumb also tender to palpation.  Neurological:     Mental Status: He is alert and oriented to person, place, and time.  Psychiatric:        Mood and Affect: Mood normal.        Thought Content: Thought content normal.      Assessment/Plan: 1. Functional deficits which require 3+ hours per day of interdisciplinary therapy in a comprehensive inpatient rehab setting. Physiatrist is providing close team supervision and 24 hour management of active medical problems listed below. Physiatrist and rehab team continue to assess barriers to  discharge/monitor patient progress toward functional and medical goals  Care Tool:  Bathing  Bathing activity did not occur: Refused           Bathing assist       Upper Body Dressing/Undressing Upper body dressing Upper body dressing/undressing activity did not occur (including orthotics): Refused (pt reported he had just changed shirt prior to evaluation)      Upper body assist      Lower Body Dressing/Undressing Lower body dressing    Lower body dressing activity did not occur: N/A What is the patient wearing?: Underwear/pull up     Lower body assist Assist for lower body dressing: Total Assistance - Patient < 25%     Toileting Toileting Toileting Activity did not occur Press photographer(Clothing management and hygiene only): Refused  Toileting assist Assist for toileting: 2 Helpers     Transfers Chair/bed transfer  Transfers assist  Chair/bed transfer activity did not occur: Refused  Chair/bed transfer assist level: Dependent - mechanical lift (Stedy)     Locomotion Ambulation   Ambulation assist   Ambulation  activity did not occur: Safety/medical concerns (Fatigue, pain)          Walk 10 feet activity   Assist  Walk 10 feet activity did not occur: Safety/medical concerns (Fatigue, pain)        Walk 50 feet activity   Assist Walk 50 feet with 2 turns activity did not occur: Safety/medical concerns (Fatigue, pain)         Walk 150 feet activity   Assist Walk 150 feet activity did not occur: Safety/medical concerns (Fatigue, pain)         Walk 10 feet on uneven surface  activity   Assist Walk 10 feet on uneven surfaces activity did not occur: Safety/medical concerns (Fatigue, pain)         Wheelchair     Assist Is the patient using a wheelchair?: Yes Type of Wheelchair: Manual    Wheelchair assist level: Dependent - Patient 0%      Wheelchair 50 feet with 2 turns activity    Assist        Assist Level: Dependent -  Patient 0%   Wheelchair 150 feet activity     Assist      Assist Level: Dependent - Patient 0%   Blood pressure (!) 144/95, pulse 81, temperature 98.2 F (36.8 C), resp. rate 15, height  (1.803 m), weight 136 kg, SpO2 97 %.    Medical Problem List and Plan: 1. 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 IVIG x 5 days completed.             -patient may shower             -ELOS/Goals: minA 14-18 days             Continue CIR 2.  Antithrombotics: -DVT/anticoagulation:  Pharmaceutical: Lovenox             -antiplatelet therapy: none 3. Left thumb pain: Ice 15 minutes TID. Tylenol. Spreading to right hand as well- appears to be his rheumatoid arthritis. Added voltaren gel. Increase prednisone to . Consider consulting his rheumatologist regarding potential treatment options 4. Mood: LCSW to evlauate and provide emotional support             -antipsychotic agents: n/a 5. Neuropsych: This patient is capable of making decisions on his own behalf. 6. Skin/Wound Care: Routine skin checks 7. Fluids/Electrolytes/Nutrition: Routine ins and outs and follow-up chemistries 8.  Peripheral polyneuropathy: Continue gabapentin 9.  Obstructive sleep apnea: Continue CPAP at night time and while napping 10: Morbid obesity: Discussed benefits of Mediterranean diet  11: Restless leg syndrome: Continue Mirapex 12: Benign prostatic hypertrophy: Continue Flomax 13: Muscle spasm: Robaxin prn (was getting Valium on acute side) 14: Vitamin B12 deficiency: continue oral supplement  15: Arthritis flare both hands: started on prednisone then weaned off; methotrexate home med held 16: Overactive bladder: continue Vibegron 17: Loose stools versus constipation: continue to monitor>>Imodium PRN/anti-constipation measures. Added prn Dulxolax.  18: History of Vitamin D deficiency: check  serum level 19: Mild thrombocytopenia: follow-up CBC 20. Left knee pain: ice 15 minutes TID. Messaged ortho PA to see if he can get knee aspiration again but pain has improved- can likely defer to later in the week, discussed with ortho PA.  21. Low back pain, chronic: add kpad.  LOS: 2 days A FACE TO FACE EVALUATION WAS PERFORMED  Horton Chin 04/22/2021, 2:38 PM

## 2021-04-22 NOTE — Progress Notes (Signed)
Patient has home CPAP at bedside. No assistance needed from RT.  °

## 2021-04-22 NOTE — Progress Notes (Signed)
Physical Therapy Session Note  Patient Details  Name: Tony Mcintosh MRN: [id number] Date of Birth: September 13, 1945  Today's Date: 04/22/2021 PT Individual Time: 1400-1500 PT Individual Time Calculation (min): 60 min   Short Term Goals: Week 1:  PT Short Term Goal 1 (Week 1): Pt will initiate gait training w/LRAD and max A PT Short Term Goal 2 (Week 1): Pt will perform sit <>stands w/LRAD and mod A PT Short Term Goal 3 (Week 1): Pt will perform bed <>chair transfers w/LRAD and max A  Skilled Therapeutic Interventions/Progress Updates:    Pt received seated in bed, agreeable to start therapy session early. Pt reports 5/10 pain in his low back and his hands due to arthritis. Pt able to receive Tylenol at beginning of session and requests his muscle relaxer at end of session. Pt utilizing pain relief cream on hands and L knee for pain management as well. Supine to sit with Supervision with HOB elevated and use of bedrail. Session focus on standing to RW and performing standing activities with RW. Sit to stand with min A to RW from significantly elevated bed. Attempt standing mini-squats, pt unable to tolerate due to L knee pain. Standing LLE marches 2 x 5 reps to fatigue. Pt unable to tolerate stance on LLE long enough to perform marches with RLE. Sidesteps L/R 2 x 5 ft with RW and mod A for balance with L knee blocked in stance due to instability. Pt fatigues very quickly with sidesteps with heavy reliance on BUE and flexed trunk noted. Demonstrated how to perform L knee stretch with use of gait belt while seated EOB. Pt returned to supine at Supervision level. Pt left seated in bed with bed in recliner position, needs in reach, son present. Education with patient and son that pt needs to try and do as much for himself as possible, understanding of education.  Therapy Documentation Precautions:  Precautions Precautions: Fall Precaution Comments: L knee buckles Restrictions Weight Bearing  Restrictions: No     Therapy/Group: Individual Therapy   Peter Congoaylor Adley Castello, PT, DPT, CSRS  04/22/2021, 5:16 PM

## 2021-04-22 NOTE — Plan of Care (Signed)
°  Problem: Consults Goal: RH SPINAL CORD INJURY PATIENT EDUCATION Description:  See Patient Education module for education specifics.  Outcome: Progressing   Problem: SCI BOWEL ELIMINATION Goal: RH STG MANAGE BOWEL WITH ASSISTANCE Description: STG Manage Bowel with mod I Assistance. Outcome: Progressing Goal: RH STG SCI MANAGE BOWEL WITH MEDICATION WITH ASSISTANCE Description: STG SCI Manage bowel with medication with mod I assistance. Outcome: Progressing   Problem: SCI BLADDER ELIMINATION Goal: RH STG MANAGE BLADDER WITH ASSISTANCE Description: STG Manage Bladder With min Assistance Outcome: Progressing Goal: RH STG MANAGE BLADDER WITH MEDICATION WITH ASSISTANCE Description: STG Manage Bladder With Medication With mod I Assistance. Outcome: Progressing   Problem: RH SAFETY Goal: RH STG ADHERE TO SAFETY PRECAUTIONS W/ASSISTANCE/DEVICE Description: STG Adhere to Safety Precautions With cues Assistance/Device. Outcome: Progressing   Problem: RH PAIN MANAGEMENT Goal: RH STG PAIN MANAGED AT OR BELOW PT'S PAIN GOAL Description: At or below level 4 with prns Outcome: Progressing   Problem: RH KNOWLEDGE DEFICIT SCI Goal: RH STG INCREASE KNOWLEDGE OF SELF CARE AFTER SCI Description: Patient and wife will be able to manage care at discharge using handouts and educational materials independently Outcome: Progressing

## 2021-04-22 NOTE — Progress Notes (Signed)
Occupational Therapy Session Note  Patient Details  Name: Tony Mcintosh MRN: [id number] Date of Birth: 02-02-1946  Today's Date: 04/23/2021 OT Individual Time: 0750-0905 OT Individual Time Calculation (min): 75 min   Short Term Goals: Week 1:  OT Short Term Goal 1 (Week 1): Pt will complete sit<>stand during functional task with mod A OT Short Term Goal 2 (Week 1): Pt will complete functional transfers with max A OT Short Term Goal 3 (Week 1): Pt will tolerate standing for 2 min to increase endurance for self-care tasks.  Skilled Therapeutic Interventions/Progress Updates:    Pt greeted in bed, son [name] at bedside. Pt was requesting to start session by using the restroom and motivated to shower. Supine<sit completed unassisted, HOB elevated pt using the bedrail. We had to adjust baseboard of bed to allow Stedy transfer to elevated toilet, pt completing this transfer with CGA of 1 sit<stand from elevated bed. Pt with B+B void, assistance required for pericare due to pt c/o functional back limitations. Transitioned to the TTB, which OT elevated beforehand to increase ease of sit<stand. Pt then bathed at seated level using LH sponge to reach feet and back. CGA for transfer out of shower using Stedy with 1 assist. Pt then dressed EOB at sit<stand level using Stedy as needed, pt able to elevate LB clothing in standing with Min A, noted he did not need the knee support of the Stedy during dynamic balance challenges. Pt able to thread LEs into underwear + pants without AE or assistance. Total A for donning footwear and he would benefit from sock aide training during a future OT session. Oral care/grooming tasks completed while sitting EOB with setup assistance. Pt very pleased that today he was able to hold his toothbrush, stated that this was the first day that he was able to do so unassisted. Pt returned to bed at close of session, all needs within reach and bed alarm set.Tx focus placed on ADL  retraining, functional transfers, activity tolerance, and balance.   Therapy Documentation Precautions:  Precautions Precautions: Fall Precaution Comments: L knee buckles Restrictions Weight Bearing Restrictions: No  Pain: in Lt knee, voltaren gel applied at end of session for relief   ADL: ADL Eating: Not assessed Grooming: Not assessed Upper Body Bathing: Not assessed Lower Body Bathing: Not assessed Upper Body Dressing: Not assessed Lower Body Dressing: Maximal assistance Where Assessed-Lower Body Dressing: Edge of bed, Other (Comment) [name]) Toileting: Not assessed Toilet Transfer Method: Not assessed Tub/Shower Transfer: Not assessed Social research officer, government: Not assessed  Therapy/Group: Individual Therapy  [name] A [name] 04/23/2021, 12:21 PM

## 2021-04-23 ENCOUNTER — Encounter (HOSPITAL_COMMUNITY): Payer: No Typology Code available for payment source

## 2021-04-23 LAB — BODY FLUID CULTURE W GRAM STAIN: Culture: NO GROWTH

## 2021-04-23 MED ORDER — METHOCARBAMOL 500 MG PO TABS
1000.0000 mg | ORAL_TABLET | Freq: Two times a day (BID) | ORAL | Status: DC | PRN
  Administered 2021-04-23 – 2021-05-06 (×14): 1000 mg via ORAL
  Filled 2021-04-23 (×15): qty 2

## 2021-04-23 MED ORDER — ENOXAPARIN SODIUM 60 MG/0.6ML IJ SOSY
60.0000 mg | PREFILLED_SYRINGE | INTRAMUSCULAR | Status: DC
  Administered 2021-04-23 – 2021-05-06 (×14): 60 mg via SUBCUTANEOUS
  Filled 2021-04-23 (×15): qty 0.6

## 2021-04-23 MED ORDER — METHOCARBAMOL 500 MG PO TABS
1000.0000 mg | ORAL_TABLET | Freq: Two times a day (BID) | ORAL | Status: DC
  Administered 2021-04-23 – 2021-05-08 (×30): 1000 mg via ORAL
  Filled 2021-04-23 (×32): qty 2

## 2021-04-23 MED ORDER — TRAZODONE HCL 50 MG PO TABS
50.0000 mg | ORAL_TABLET | Freq: Every day | ORAL | Status: DC
  Administered 2021-04-23 – 2021-05-08 (×16): 50 mg via ORAL
  Filled 2021-04-23 (×16): qty 1

## 2021-04-23 NOTE — Progress Notes (Signed)
Inpatient Rehabilitation  Patient information reviewed and entered into eRehab system by Eulice Rutledge M. Helder Crisafulli, M.A., CCC/SLP, PPS Coordinator.  Information including medical coding, functional ability and quality indicators will be reviewed and updated through discharge.    

## 2021-04-23 NOTE — Progress Notes (Addendum)
PROGRESS NOTE   Subjective/Complaints: Left knee pain resolved, pain is currently  Pt had order for MTX as of Friday but refused it; however wants [place] to call his rheum about restarting MTX- d/w PA and she l/m for rheum in [place].   Can now use R thumb which is new.  Muscle spasms are the worst part- non in AM; but starts around /after therapy- and into night time. Sleeping pill works, but gets too late- wants earlier.  On muscle relaxant, helpful, but "not enough".  On contact precautions for norovirus.  Wants fluids taken off L knee again- by Ortho.    ROS:  Pt denies SOB, abd pain, CP, N/V/C/D, and vision changes   Objective:   No results found. Recent Labs    04/21/21 0538  WBC 4.9  HGB 13.5  HCT 38.5*  PLT 136*   Recent Labs    04/21/21 0538  NA 132*  K 3.9  CL 101  CO2 23  GLUCOSE 105*  BUN 20  CREATININE 0.92  CALCIUM 8.4*    Intake/Output Summary (Last 24 hours) at 04/23/2021 1800 Last data filed at 04/23/2021 1525 Gross per 24 hour  Intake 480 ml  Output 1375 ml  Net -895 ml        Physical Exam: Vital Signs Blood pressure (!) 162/87, pulse 75, temperature 97.7 F (36.5 C), resp. rate 16, height  (1.803 m), weight 136 kg, SpO2 97 %.    General: awake, alert, appropriate, sitting up in bed ;getting pills from nursing; NAD HENT: conjugate gaze; oropharynx moist CV: regular rate; no JVD Pulmonary: CTA B/L; no W/R/R- good air movement GI: soft, NT, ND, (+)BS- protuberant; normoactive Psychiatric: appropriate; irritable Neurological: alert; using R hand better; more dextrous Musculoskeletal: saw muscle spasms on exam and on video last night.     Cervical back: Rigidity present. Right knee swollen, erythematous, and tender to palpation 5/5 strength with the exception of LLE 4/5- greatly improved as knee swelling is notable- moderate esp suprapatellar, however nothing to compare it to.    Neurological:     Mental Status: He is alert and oriented to person, place, and time.     Assessment/Plan: 1. Functional deficits which require 3+ hours per day of interdisciplinary therapy in a comprehensive inpatient rehab setting. Physiatrist is providing close team supervision and 24 hour management of active medical problems listed below. Physiatrist and rehab team continue to assess barriers to discharge/monitor patient progress toward functional and medical goals  Care Tool:  Bathing  Bathing activity did not occur: Refused Body parts bathed by patient: Right arm, Left arm, Chest, Abdomen, Front perineal area, Right upper leg, Left upper leg, Right lower leg, Face, Left lower leg   Body parts bathed by helper: Buttocks     Bathing assist Assist Level: Minimal Assistance - Patient > 75%     Upper Body Dressing/Undressing Upper body dressing Upper body dressing/undressing activity did not occur (including orthotics): Refused (pt reported he had just changed shirt prior to evaluation) What is the patient wearing?: Pull over shirt    Upper body assist Assist Level: Set up assist    Lower Body Dressing/Undressing Lower  body dressing      What is the patient wearing?: Underwear/pull up, Pants     Lower body assist Assist for lower body dressing: Maximal Assistance - Patient 25 - 49% (using Stedy)     Toileting Toileting Toileting Activity did not occur Press photographer and hygiene only): Refused  Toileting assist Assist for toileting: Dependent - Patient 0% (1 assist using Stedy)     Transfers Chair/bed transfer  Transfers assist  Chair/bed transfer activity did not occur: Refused  Chair/bed transfer assist level: Dependent - mechanical lift (stedy)     Locomotion Ambulation   Ambulation assist   Ambulation activity did not occur: Safety/medical concerns (Fatigue, pain)  Assist level: 2 helpers Assistive device: Parallel bars Max distance: 10'    Walk 10 feet activity   Assist  Walk 10 feet activity did not occur: Safety/medical concerns (Fatigue, pain)  Assist level: 2 helpers Assistive device: Walker-rolling   Walk 50 feet activity   Assist Walk 50 feet with 2 turns activity did not occur: Safety/medical concerns (Fatigue, pain)         Walk 150 feet activity   Assist Walk 150 feet activity did not occur: Safety/medical concerns (Fatigue, pain)         Walk 10 feet on uneven surface  activity   Assist Walk 10 feet on uneven surfaces activity did not occur: Safety/medical concerns (Fatigue, pain)         Wheelchair     Assist Is the patient using a wheelchair?: Yes Type of Wheelchair: Manual    Wheelchair assist level: Dependent - Patient 0%      Wheelchair 50 feet with 2 turns activity    Assist        Assist Level: Dependent - Patient 0%   Wheelchair 150 feet activity     Assist      Assist Level: Dependent - Patient 0%   Blood pressure (!) 162/87, pulse 75, temperature 97.7 F (36.5 C), resp. rate 16, height  (1.803 m), weight 136 kg, SpO2 97 %.    Medical Problem List and Plan: 1. 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 IVIG x 5 days completed.             -patient may shower             -ELOS/Goals: minA 14-18 days            1/23- team conference tomorrow to determine length of stay- IPOC today.  2.  Antithrombotics: -DVT/anticoagulation:  Pharmaceutical: Lovenox             -antiplatelet therapy: none 3. Left thumb pain: Ice 15 minutes TID. Tylenol. Spreading to right hand as well- appears to be his rheumatoid arthritis. Added voltaren gel. Increase prednisone to . Consider consulting his rheumatologist regarding potential treatment options  1/23- L/m for pt's Rheum about his prednisone/MTX- refused MTX last week when ordered, of  note.  4. Mood: LCSW to evlauate and provide emotional support             -antipsychotic agents: n/a 5. Neuropsych: This patient is capable of making decisions on his own behalf. 6. Skin/Wound Care: Routine skin checks 7. Fluids/Electrolytes/Nutrition: Routine ins and outs and follow-up chemistries 8.  Peripheral polyneuropathy: Continue gabapentin 9.  Obstructive sleep apnea: Continue CPAP at night time and while napping 10: Morbid obesity: Discussed benefits of Mediterranean diet  11: Restless leg syndrome: Continue Mirapex 12: Benign prostatic hypertrophy: Continue Flomax 13: Muscle spasm: Robaxin prn (was getting Valium on acute side)  1/23- increased robaxin to 2x/day at 3pm and 9pm per pt request and BID prn increased to 1000 mg- max 4x/day. 14: Vitamin B12 deficiency: continue oral supplement  15: Arthritis flare both hands: started on prednisone then weaned off; methotrexate home med held 16: Overactive bladder: continue Vibegron 17: Loose stools versus constipation: continue to monitor>>Imodium PRN/anti-constipation measures. Added prn Dulxolax.  18: History of Vitamin D deficiency: check serum level 19: Mild thrombocytopenia: follow-up CBC  1/23- plts down slightly to 136k- will recheck in AM 20. Left knee pain: ice 15 minutes TID. Messaged ortho PA to see if he can get knee aspiration again but pain has improved- can likely defer to later in the week, discussed with ortho PA.   1/23- has already d/w ortho- they don't want to take more fluid off L knee right now-  21. Low back pain, chronic: add kpad.   I spent a total of 52 minutes on total care today- saw pt x2; spoke with pt's son; Pt's PA and other physician regarding ortho and doing IPOC. >50% coordination of care.   LOS: 3 days A FACE TO FACE EVALUATION WAS PERFORMED  Lenox Ladouceur 04/23/2021, 6:00 PM

## 2021-04-23 NOTE — Progress Notes (Signed)
Messaged Dr. Krystal Clark staff in regards to resuming methotrexate. Awaiting call back.

## 2021-04-23 NOTE — Progress Notes (Signed)
Inpatient Rehabilitation Care Coordinator Assessment and Plan Patient Details  Name: BENIGNO CHECK MRN: [id number] Date of Birth: [birth year]  Today's Date: 04/23/2021  Hospital Problems: Principal Problem:   GBS (Guillain-Barre syndrome) ([place])  Past Medical History:  Past Medical History:  Diagnosis Date   Back complaints    BPH (benign prostatic hyperplasia)    Chronic back pain    Lumbar stenosis    Osteoporosis    Vitamin D deficiency    Past Surgical History:  Past Surgical History:  Procedure Laterality Date   BACK SURGERY  2011   CARPAL TUNNEL RELEASE     lumbar back surger     Social History:  reports that he has never smoked. He does not have any smokeless tobacco history on file. He reports current alcohol use. No history on file for drug use.  Family / Support Systems Marital Status: Married How Long?: 74 years Patient Roles: Spouse, Parent Spouse/Significant Other: Tye [place] (wife): [phone] Children: Blended family. Pt has two adult sons that live in [place], [place]; and pt wife has two adult dtrs that live in [place], [place]. Other Supports: NOne reported Anticipated Caregiver: Wife Ability/Limitations of Caregiver: Wife has fibromylagia in which she has pain all the time, and unable to lift; wife will provide supervision. Caregiver Availability: 24/7 Family Dynamics: Pt lives with his wife.  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 Pt reports that mhe works in [employer] working on Emergency planning/management officer. Education: high school grad Health Literacy - How often do you need to have someone help you when you read instructions, pamphlets, or other written material from your doctor or pharmacy?: Never Writes: Yes Employment Status: Retired   Abuse/Neglect Abuse/Neglect Assessment Can Be  Completed: Yes Physical Abuse: Denies Verbal Abuse: Denies Sexual Abuse: Denies Exploitation of patient/patient's resources: Denies Self-Neglect: Denies  Patient response to: Social Isolation - How often do you feel lonely or isolated from those around you?: Never  Emotional Status Pt's affect, behavior and adjustment status: Pt in good spirits at time of visit Recent Psychosocial Issues: Denies Psychiatric History: Denies Substance Abuse History: Pt admits to drinking beer in which he splits with his wife sometimes 1-2xs per week.  Patient / Family Perceptions, Expectations & Goals Pt/Family understanding of illness & functional limitations: Pt and wife have a general understanding of pt care needs. Premorbid pt/family roles/activities: Some assistance with ADLs/IADLs and walking; dependent for stairs. Anticipated changes in roles/activities/participation: continued assistance with ADLs/IADLs Pt/family expectations/goals: Pt goal is to work on " getting hands working (currently numb), get in/out of bed/bathroom on own; stand a sink to to brush teeth and get to a take shower, sit in normal chair, and walk with a cane.  Community Resources Express Scripts: None Premorbid Home Care/DME Agencies: None Transportation available at discharge: TBD Is the patient able to respond to transportation needs?: Yes In the past 12 months, has lack of transportation kept you from medical appointments or from getting medications?: No In the past 12 months, has lack of transportation kept you from meetings, work, or from getting things needed for daily living?: No Resource referrals recommended: Neuropsychology  Discharge Planning Living Arrangements: Spouse/significant other Support Systems: Spouse/significant other Type of Residence: Private residence Insurance underwriter Resources: Multimedia programmer (specify), Medicare ([place] (primary), [place] (secondary)) Financial Resources: Social  Security Financial Screen Referred: No Living Expenses:  Own Money Management: Patient, Spouse Does the patient have any problems obtaining your medications?: No Home Management: Pt wife prepares meals. Patient/Family Preliminary Plans: No changes Care Coordinator Barriers to Discharge: Decreased caregiver support, Lack of/limited family support, Home environment access/layout Care Coordinator Anticipated Follow Up Needs: HH/OP Expected length of stay: 14-18 days  Clinical Impression SW met with pt and pt wife in room to introduce self, explain role, and discuss discharge process. 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 No HCPOA. DME: power chair, shower chair, and RW (purchased on own). Pharmacy- [facility].  Rana Snare 04/23/2021, 3:34 PM

## 2021-04-23 NOTE — IPOC Note (Signed)
Overall Plan of Care [facility]) Patient Details Name: Tony Mcintosh MRN: [id number] DOB: 07/31/1945  Admitting Diagnosis: GBS (Guillain-Barre syndrome) [facility])  Hospital Problems: Principal Problem:   GBS (Guillain-Barre syndrome) ([place])     Functional Problem List: Nursing Bladder, Bowel, Endurance, Pain, Medication Management, Safety  PT Balance, Behavior, Edema, Endurance, Motor, Pain, Safety, Sensory  OT Balance, Safety, Sensory, Edema, Endurance, Motor, Pain, Perception  SLP    TR         Basic ADLs: OT Grooming, Dressing, Bathing, Eating, Toileting     Advanced  ADLs: OT       Transfers: PT Bed Mobility, Bed to Chair, Car, Manufacturing systems engineer, Metallurgist: PT Ambulation, Wheelchair Mobility     Additional Impairments: OT None  SLP        TR      Anticipated Outcomes Item Anticipated Outcome  Self Feeding set-up assist  Swallowing      Basic self-care  min A overall  Toileting  min A   Bathroom Transfers min A  Bowel/Bladder  manage bowel with mod I assist and bladder with min assist  Transfers  Min A  Locomotion  S*  Communication     Cognition     Pain  at or below level 4 with prn meds  Safety/Judgment  maintain safety with cues   Therapy Plan: PT Intensity: Minimum of 1-2 x/day ,45 to 90 minutes PT Frequency: 5 out of 7 days PT Duration Estimated Length of Stay: 14-18 days OT Intensity: Minimum of 1-2 x/day, 45 to 90 minutes OT Frequency: 5 out of 7 days OT Duration/Estimated Length of Stay: 14-18 days     Due to the current state of emergency, patients may not be receiving their 3-hours of Medicare-mandated therapy.   Team Interventions: Nursing Interventions Bladder Management, Disease Management/Prevention, Medication Management, Discharge Planning, Pain Management, Bowel Management, Patient/Family Education  PT interventions Ambulation/gait training, Community reintegration, DME/adaptive equipment instruction,  Neuromuscular re-education, Psychosocial support, UE/LE Strength taining/ROM, Wheelchair propulsion/positioning, UE/LE Coordination activities, Therapeutic Activities, Skin care/wound management, Pain management, Functional electrical stimulation, Discharge planning, Balance/vestibular training, Cognitive remediation/compensation, Disease management/prevention, Functional mobility training, Patient/family education, Therapeutic Exercise, Visual/perceptual remediation/compensation  OT Interventions Balance/vestibular training, Disease mangement/prevention, Neuromuscular re-education, Self Care/advanced ADL retraining, Therapeutic Exercise, Wheelchair propulsion/positioning, DME/adaptive equipment instruction, Pain management, Skin care/wound managment, UE/LE Strength taining/ROM, Community reintegration, Functional electrical stimulation, Patient/family education, Splinting/orthotics, UE/LE Coordination activities, Discharge planning, Functional mobility training, Psychosocial support, Therapeutic Activities  SLP Interventions    TR Interventions    SW/CM Interventions Discharge Planning, Psychosocial Support, Patient/Family Education   Barriers to Discharge MD  Medical stability, Home enviroment access/loayout, Neurogenic bowel and bladder, Lack of/limited family support, Weight, Medication compliance, and Behavior  Nursing Decreased caregiver support, Home environment access/layout 2 level 5/0 ste with wife; has electric lift for 2nd floor access, electric scooter, RW and SPC  PT Decreased caregiver support, Lack of/limited family support, Behavior    OT      SLP      SW Decreased caregiver support, Lack of/limited family support, Home environment Engineer, maintenance Discharge Planning: Destination: PT-Home ,OT- Home , SLP-  Projected Follow-up: PT-Home health PT, OT-  24 hour supervision/assistance, Home health OT, SLP-  Projected Equipment Needs: PT-To be determined, OT- To be determined,  SLP-  Equipment Details: PT- , OT-  Patient/family involved in discharge planning: PT- Patient, Family member/caregiver,  OT-Patient, SLP-   MD ELOS: 14-18 days Medical Rehab  Prognosis:  Good Assessment: Pt is a 76 yr old male with GBS and generalized weakness- as well as HTN, low plts; OSA/CPAP; morbid obesity; RLS; muscle spasms; L knee swelling/fluid; RA on MTX/prednisone-   Goals min A    See Team Conference Notes for weekly updates to the plan of care

## 2021-04-23 NOTE — Progress Notes (Signed)
Patient wearing home CPAP when RT entered room. No distress noted. RT will monitor as needed.

## 2021-04-23 NOTE — Plan of Care (Signed)
°  Problem: RH Wheelchair Mobility Goal: LTG Patient will propel w/c in controlled environment (PT) Description: LTG: Patient will propel wheelchair in controlled environment, # of feet with assist (PT) Flowsheets (Taken 04/23/2021 1357) LTG: Pt will propel w/c in controlled environ  assist needed:: (d/c goal due to RA and use of motorized w/c prior to admission) -- Note: d/c goal due to RA and use of motorized w/c prior to admission

## 2021-04-23 NOTE — Progress Notes (Addendum)
Patient ID: Tony Mcintosh, male   DOB: 1945-05-02, 76 y.o.   MRN: [id number]  0940am- SW left message for CM Laqueta Carina (p:[phone] ext [phone]) to get updates on pt assigned SW.  *SW received return phone call giving updates on how to order HH/DME/Outpatient if needed; and to contact pt assigned SW to discuss pt d/c needs.   SWFrench Ana Ward [phone] ext [phone]) Britt Boozer, NP at [facility] [phone]- SW spoke with [facility] French Ana Ward to introduce self, explain role, and discuss discharge process. Reports to send all orders for pt care needs to her by completing service request form,  clinicals, and face to face encounter. Aware SW will f/u after team conference tomorrow with updates.   SW received updates from [place] that pt request for home repairs and w/c Zenaida Niece were as follows: consult for a Hitch Type Lift for PWC: 2020 [employer] Ridgeline Pick UP Truck. He can call the [facility] to discuss [phone] ext [phone]. A note was made about the consult about a quote which was sent back stating they will not be able to provide a va vehicle lift for [facility] b/c of the tongue rating is not strong enough per vendor [employer]. No home repairs indicated in note.   SW updated on pt concerns related to his bathroom setup, and will give updates once available from therapy team. SW waiting to hear from [place] SW about aide and attendant form as pt reports he is 100% disabled from [place] and eligible for services.   Cecile Sheerer, MSW, LCSWA Office: [phone] Cell: [phone] Fax: [phone]

## 2021-04-23 NOTE — Progress Notes (Signed)
Physical Therapy Session Note  Patient Details  Name: Tony Mcintosh MRN: [id number] Date of Birth: Feb 20, 1946  Today's Date: 04/23/2021 PT Individual Time: 1015-1130; 1600-1700 PT Individual Time Calculation (min): 75 min and 60 min  Short Term Goals: Week 1:  PT Short Term Goal 1 (Week 1): Pt will initiate gait training w/LRAD and max A PT Short Term Goal 2 (Week 1): Pt will perform sit <>stands w/LRAD and mod A PT Short Term Goal 3 (Week 1): Pt will perform bed <>chair transfers w/LRAD and max A  Skilled Therapeutic Interventions/Progress Updates:    Session 1: Pt received seated in bed in recliner position, agreeable to PT session. Pt reports ongoing pain in his hands and L knee, utilizing pain relief cream. Supine to sit with Supervision with HOB elevated and use of bedrail. Sit to stand with min A to stedy from elevated bed. Stedy transfer to w/c. Obtained w/c cushion for improved comfort and skin integrity as well as elevated seat height for increased ease of sit to stand transfer. Assisted pt with donning socks and shoes while seated in w/c. Dependent transport via w/c to/from therapy gym for time and energy conservation. Sit to stand in // bars with min A. Standing alt L/R lifts 2 x 10 reps with light blocking of knees in stance. Ambulation 3 x 10 ft forwards, 2 x 10 ft backwards in // bars with mod A for balance, close w/c follow for safety. Light blocking of knees in stance to prevent buckling. No instances of buckling noted but pt does report some "shakiness" of L knee with onset of fatigue. Pt reports urge to have a BM at end of session. Sit to stand with max A to stedy from w/c seat height. Stedy transfer to elevated BSC over toilet. Pt able to void a minimal amount of BM while seated on toilet, requesting anti-constipation medication from nursing. Pt is dependent for pericare and max A for clothing management. Pt requests to return to bed due to fatigue. Pt left seated EOB with  needs in reach, wife present.  Session 2: Pt received seated EOB, agreeable to PT session. Pt reports ongoing pain in hands from RA, PA in room to address medication changes to assist with RA symptoms. Sit to stand with min A to stedy from elevated surfaces during session. Stedy transfer to w/c then w/c to mat table. Sit to stand with min A to RW from elevated mat table with B knees lightly blocked. Sidesteps L/R 2 x 5 ft with RW and min A for balance, onset of L knee buckling and instability with onset of fatigue. Pt requesting to weigh himself, sit to stand with min A to standing scale from elevated mat table. Pt weighs 303.8 lbs. Pt requests to return to bed at end of session, stedy transfer w/c to bed with mod A to stand from lower w/c seat height. Pt left seated EOB in room with needs in reach, bed alarm in place at end of session.  Therapy Documentation Precautions:  Precautions Precautions: Fall Precaution Comments: L knee buckles Restrictions Weight Bearing Restrictions: No      Therapy/Group: Individual Therapy   Peter Congo, PT, DPT, CSRS  04/23/2021, 12:16 PM

## 2021-04-23 NOTE — Care Management (Signed)
Inpatient Rehabilitation Center Individual Statement of Services  Patient Name:  TAEVON SENDRA  Date:  04/23/2021  Welcome to the Inpatient Rehabilitation Center.  Our goal is to provide you with an individualized program based on your diagnosis and situation, designed to meet your specific needs.  With this comprehensive rehabilitation program, you will be expected to participate in at least 3 hours of rehabilitation therapies Monday-Friday, with modified therapy programming on the weekends.  Your rehabilitation program will include the following services:  Physical Therapy (PT), Occupational Therapy (OT), 24 hour per day rehabilitation nursing, Therapeutic Recreaction (TR), Psychology, Neuropsychology, Care Coordinator, Rehabilitation Medicine, Nutrition Services, Pharmacy Services, and Other  Weekly team conferences will be held on Tuesdays to discuss your progress.  Your Inpatient Rehabilitation Care Coordinator will talk with you frequently to get your input and to update you on team discussions.  Team conferences with you and your family in attendance may also be held.  Expected length of stay: 14-18 days    Overall anticipated outcome: Minimal Assistance  Depending on your progress and recovery, your program may change. Your Inpatient Rehabilitation Care Coordinator will coordinate services and will keep you informed of any changes. Your Inpatient Rehabilitation Care Coordinator's name and contact numbers are listed  below.  The following services may also be recommended but are not provided by the Inpatient Rehabilitation Center:  Driving Evaluations Home Health Rehabiltiation Services Outpatient Rehabilitation Services Vocational Rehabilitation   Arrangements will be made to provide these services after discharge if needed.  Arrangements include referral to agencies that provide these services.  Your insurance has been verified to be:  [employer]  Your primary doctor  is:  [facility]  Pertinent information will be shared with your doctor and your insurance company.  Inpatient Rehabilitation Care Coordinator:  Susie Cassette [phone] or [phone]  Information discussed with and copy given to patient by: Gretchen Short, 04/23/2021, 9:41 AM

## 2021-04-24 ENCOUNTER — Encounter (HOSPITAL_COMMUNITY): Payer: Medicare Other

## 2021-04-24 LAB — ANAEROBIC CULTURE W GRAM STAIN

## 2021-04-24 MED ORDER — PREDNISONE 10 MG PO TABS
10.0000 mg | ORAL_TABLET | Freq: Every day | ORAL | Status: DC
  Administered 2021-04-25 – 2021-05-01 (×7): 10 mg via ORAL
  Filled 2021-04-24 (×8): qty 1

## 2021-04-24 MED ORDER — METHOTREXATE 2.5 MG PO TABS
17.5000 mg | ORAL_TABLET | ORAL | Status: DC
  Administered 2021-04-24 – 2021-05-08 (×3): 17.5 mg via ORAL
  Filled 2021-04-24 (×3): qty 7

## 2021-04-24 MED ORDER — PRAMIPEXOLE DIHYDROCHLORIDE 1 MG PO TABS
1.0000 mg | ORAL_TABLET | Freq: Two times a day (BID) | ORAL | Status: DC
  Administered 2021-04-24 – 2021-05-08 (×30): 1 mg via ORAL
  Filled 2021-04-24 (×30): qty 1

## 2021-04-24 MED ORDER — SORBITOL 70 % SOLN
45.0000 mL | Freq: Once | Status: AC
Start: 2021-04-24 — End: 2021-04-24
  Administered 2021-04-24: 17:00:00 45 mL via ORAL
  Filled 2021-04-24: qty 60

## 2021-04-24 NOTE — Progress Notes (Signed)
Physical Therapy Session Note  Patient Details  Name: Tony Mcintosh R [place] MRN: [id number] Date of Birth: 09-01-1945  Today's Date: 04/24/2021 PT Individual Time: 0900-1000; 1400-1500 PT Individual Time Calculation (min): 60 min and 60 min  Short Term Goals: Week 1:  PT Short Term Goal 1 (Week 1): Pt will initiate gait training w/LRAD and max A PT Short Term Goal 2 (Week 1): Pt will perform sit <>stands w/LRAD and mod A PT Short Term Goal 3 (Week 1): Pt will perform bed <>chair transfers w/LRAD and max A  Skilled Therapeutic Interventions/Progress Updates:    Session 1: Pt received seated in w/c in room, agreeable to PT session. Pt reports ongoing pain in LLE that increases with mobility. Sit to stand with min A in // bars with B knees lightly blocked. Standing alt L/R marches 2 x 10 reps in // bars with min A for balance and knees lightly blocked. Pt exhibits increased instability in L knee this date. ACE-wrapped L knee for improved stability. Pt initially reports improvement in stability in L knee in stance with use of ACE wrap. Sit to stand with mod to max A to RW from w/c with knees blocked. Ambulation 2 x 8 ft with RW and mod A for balance with close w/c follow for safety. Pt reports onset of "twinge" pain in L knee during gait. Removed ACE wrap from knee. Pt reports improvement in symptoms but ongoing pain in L knee even at rest. L knee joint and patellar region tender to palpation, unable to mobilize patella due to hypersensitivity. Noted ongoing fluid/edema at joint. Will attempt taping next session and deferred use of ACE wrap for stability due to compression through patella. Seated endurance task performing 2# weighted dowel volleyball 3 x 15 reps to fatigue. Pt exhibits occasional difficulty grasping dowel due to grip weakness. Pt left seated in w/c in room with needs in reach at end of session.  Session 2: Pt received seated in bed, agreeable to PT session. Pt with ongoing pain and edema  in L knee with tenderness to palpation in patella. Able to perform lateral/medial patellar glides, unable to perform anterior/posterior due to ROM restriction. Placed stabilization kinesiotape on L knee around patella laterally and medially to provide support. Also placed kinestape in fan formation from L knee in proximal direction to assist with edema of knee. Pt with no adverse effects noted after placement of tape. Educated pt on tape wear schedule, skin inspection, etc. Discussed pt's home setup and pt able to provide pictures and measurements for most areas of the home. Pt able to access most areas of the home via motorized scooter and can safely enter/exit the home via scooter. Pt unable to access bathroom and other small areas with use of scooter as well as currently does not have a way to transport scooter for community outings and doctor's appointments. Will have CSW discuss with patient accessible transportation options available in his community. Also discussed pt's CLOF and anticipated LOF upon d/c home with min A goals. Explained "min A" and that pt will likely still require some physical assistance for safety upon d/c home with plans to perform transfers with RW and perform short distance gait with RW. [name] unable to provide this level of assistance. Supine to sit with Supervision with HOB elevated and use of bedrail. Sit to stand with mod A to RW. Standing alt L/R marches 2 x 10 reps with min A for balance. [facility]est[facility] the [facility] with  RW and mod A for balance. Pt left seated EOB in room with needs in reach at end of session.  Therapy Documentation Precautions:  Precautions Precautions: Fall Precaution Comments: L knee buckles Restrictions Weight Bearing Restrictions: No      Therapy/Group: Individual Therapy   Peter Congo, PT, DPT, CSRS  04/24/2021, 12:24 PM

## 2021-04-24 NOTE — Patient Care Conference (Signed)
Inpatient RehabilitationTeam Conference and Plan of Care Update Date: 04/24/2021   Time: 11:28 AM    Patient Name: Tony Mcintosh      Medical Record Number: [id number]  Date of Birth: [birth year] Sex: Male         Room/Bed: 4M04C/4M04C-01 Payor Info: Payor: MEDICARE / Plan: MEDICARE PART A AND B / Product Type: *No Product type* /    Admit Date/Time:  04/20/2021  2:47 PM  Primary Diagnosis:  GBS (Guillain-Barre syndrome) [facility])  Hospital Problems: Principal Problem:   GBS (Guillain-Barre syndrome) [facility])    Expected Discharge Date: Expected Discharge Date: 05/09/21  Team Members Present: Physician leading conference: Dr. Courtney Heys Social Worker Present: Loralee Pacas, [place] Nurse Present: Dorien Chihuahua, RN PT Present: Excell Seltzer, PT OT Present: Roanna Epley, COTA PPS Coordinator present : Gunnar Fusi, SLP     Current Status/Progress Goal Weekly Team Focus  Bowel/Bladder   Pt is continent of B/B. LBM: 04/22/21  remain cont. of B/B  Frequent toileting Q shift and PRN   Swallow/Nutrition/ Hydration             ADL's   bathing-min/mod A; LB dressing-max A; toileting-tot A; tranfsers-dependent with Stedy  min A overall  activity tolerance, funtional tranfsers, BADLs, safety awareness   Mobility   max A rolling, Supervision supine to/from sit, min A to stand from elevated surfaces/max A to stand from lower surfaces, transfers dependently with stedy, gait in // bars with +2  min A overall, short distance gait Supervision (may downgrade pending progress)  standing tolerance, pre-gait and gait in // bars, pt and family education   Communication             Safety/Cognition/ Behavioral Observations            Pain   Pt currently complains of muscle spasms and generalized pain  decrease muscke spasms and pain score <3  Assess pain Q shift and PRN   Skin   Pt skin is intact  pt skin to remain intact  skin assessment Q shift and PRN     Discharge Planning:  Pt will  d/c to home with his wife who is the primary caregiver and unable to provide physical assistsance.SW will see services pt is eligible for through [place].   Team Discussion: MD restart home medications and increased prednisone dose. Adjusted muscle relaxers and sleep aides. Diarrhea decreased post norovirus. C/O left knee pain and patellar tenderness (aspirated x 2) but remains unstable. Patient on target to meet rehab goals: yes, currently min assist for standing and max assist from a w/c; using a stedy for safety at present. Able to ambulate up to 8' using a RW with +2 assist. Goals for discharge set for min assist overall.  *See Care Plan and progress notes for long and short-term goals.   Revisions to Treatment Plan:  N/A   Teaching Needs: Safety, medication management, transfers, toileting, etc.   Current Barriers to Discharge: Decreased caregiver support  Possible Resolutions to Barriers: HH follow up services recommended DME: pending     Medical Summary Current Status: constipated- skin looks good; RA- acting up; L knee swellling/pain- already drained x2;  Barriers to Discharge: Medical stability;Behavior;Decreased family/caregiver support;Home Dentist;Incontinence;Weight;Medication compliance  Barriers to Discharge Comments: GBS- is barrier- needs to get equipment, etc through [place]; poor endurance; walked 8 ft A of 2- Possible Resolutions to [employer] Focus: give sorbitol 60cc for bowels; taping L knee/patella; increase Prednisone and restarted MTX;  changed  Requip- d/c 05/09/21   Continued Need for Acute Rehabilitation Level of Care: The patient requires daily medical management by a physician with specialized training in physical medicine and rehabilitation for the following reasons: Direction of a multidisciplinary physical rehabilitation program to maximize functional independence : Yes Medical management of patient stability for increased activity  during participation in an intensive rehabilitation regime.: Yes Analysis of laboratory values and/or radiology reports with any subsequent need for medication adjustment and/or medical intervention. : Yes   I attest that I was present, lead the team conference, and concur with the assessment and plan of the team.   Dorien Chihuahua B 04/24/2021, 3:08 PM

## 2021-04-24 NOTE — Progress Notes (Signed)
PROGRESS NOTE   Subjective/Complaints:  Pt is asking if can get off Contact precautions?- on for Norovirus?  Pt asking if can start MTX and increase Prednisone back to baseline dose of 10 mg daily.   RLS Sx's due to having timing off on Requip- will change timing.  Still pretty constipated- little BM yesterday but feels bloated/distended.   Showed me pics of prednisone and MTX dosing.   ROS:   Pt denies SOB, abd pain, CP, N/V/C/D, and vision changes Hands hurt from RA.   Objective:   No results found. No results for input(s): WBC, HGB, HCT, PLT in the last 72 hours.  No results for input(s): NA, K, CL, CO2, GLUCOSE, BUN, CREATININE, CALCIUM in the last 72 hours.   Intake/Output Summary (Last 24 hours) at 04/24/2021 0950 Last data filed at 04/24/2021 0700 Gross per 24 hour  Intake 390 ml  Output 1075 ml  Net -685 ml        Physical Exam: Vital Signs Blood pressure 139/90, pulse 73, temperature 97.7 F (36.5 C), temperature source Oral, resp. rate 18, height  (1.803 m), weight 136 kg, SpO2 97 %.     General: awake, alert, appropriate, sitting up  in bed; NAD HENT: conjugate gaze; oropharynx moist CV: regular rate; no JVD Pulmonary: CTA B/L; no W/R/R- good air movement GI: soft, NT: but distended and hypoactive BS Psychiatric: appropriate- interactive Neurological: Ox3 Musculoskeletal: saw muscle spasms on exam and on video last night. Hands more swollen/effusions noted    Cervical back: Rigidity present. Right knee swollen, erythematous, and tender to palpation 5/5 strength with the exception of LLE 4/5- greatly improved as knee swelling is notable- moderate esp suprapatellar, however nothing to compare it to. Looks stable today  Neurological:     Mental Status: He is alert and oriented to person, place, and time.     Assessment/Plan: 1. Functional deficits which require 3+ hours per day of  interdisciplinary therapy in a comprehensive inpatient rehab setting. Physiatrist is providing close team supervision and 24 hour management of active medical problems listed below. Physiatrist and rehab team continue to assess barriers to discharge/monitor patient progress toward functional and medical goals  Care Tool:  Bathing  Bathing activity did not occur: Refused Body parts bathed by patient: Right arm, Left arm, Chest, Abdomen, Front perineal area, Right upper leg, Left upper leg, Right lower leg, Face, Left lower leg   Body parts bathed by helper: Buttocks     Bathing assist Assist Level: Minimal Assistance - Patient > 75%     Upper Body Dressing/Undressing Upper body dressing Upper body dressing/undressing activity did not occur (including orthotics): Refused (pt reported he had just changed shirt prior to evaluation) What is the patient wearing?: Pull over shirt    Upper body assist Assist Level: Set up assist    Lower Body Dressing/Undressing Lower body dressing      What is the patient wearing?: Underwear/pull up, Pants     Lower body assist Assist for lower body dressing: Maximal Assistance - Patient 25 - 49% (using Stedy)     Toileting Toileting Toileting Activity did not occur Press photographer and hygiene only): Refused  Toileting assist Assist for toileting: Dependent - Patient 0% (1 assist using Stedy)     Transfers Chair/bed transfer  Transfers assist  Chair/bed transfer activity did not occur: Refused  Chair/bed transfer assist level: Dependent - mechanical lift (stedy)     Locomotion Ambulation   Ambulation assist   Ambulation activity did not occur: Safety/medical concerns (Fatigue, pain)  Assist level: 2 helpers Assistive device: Parallel bars Max distance: 10'   Walk 10 feet activity   Assist  Walk 10 feet activity did not occur: Safety/medical concerns (Fatigue, pain)  Assist level: 2 helpers Assistive device:  Walker-rolling   Walk 50 feet activity   Assist Walk 50 feet with 2 turns activity did not occur: Safety/medical concerns (Fatigue, pain)         Walk 150 feet activity   Assist Walk 150 feet activity did not occur: Safety/medical concerns (Fatigue, pain)         Walk 10 feet on uneven surface  activity   Assist Walk 10 feet on uneven surfaces activity did not occur: Safety/medical concerns (Fatigue, pain)         Wheelchair     Assist Is the patient using a wheelchair?: Yes Type of Wheelchair: Manual    Wheelchair assist level: Dependent - Patient 0%      Wheelchair 50 feet with 2 turns activity    Assist        Assist Level: Dependent - Patient 0%   Wheelchair 150 feet activity     Assist      Assist Level: Dependent - Patient 0%   Blood pressure 139/90, pulse 73, temperature 97.7 F (36.5 C), temperature source Oral, resp. rate 18, height  (1.803 m), weight 136 kg, SpO2 97 %.    Medical Problem List and Plan: 1. 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 IVIG x 5 days completed.             -patient may shower             -ELOS/Goals: minA 14-18 days           1/24- team conference today to determine length of stay- con't CIR- PT and OT 2.  Antithrombotics: -DVT/anticoagulation:  Pharmaceutical: Lovenox             -antiplatelet therapy: none 3. Left thumb pain: Ice 15 minutes TID. Tylenol. Spreading to right hand as well- appears to be his rheumatoid arthritis. Added voltaren gel. Increase prednisone to . Consider consulting his rheumatologist regarding potential treatment options  1/23- L/m for pt's Rheum about his prednisone/MTX- refused MTX last week when ordered, of note. 1/24- changed MTX to start today 17.5 mg qweek; got OK from Rheum; also increased Prednisone to his home dose of 10 mg daily.    4. Mood: LCSW to evlauate and provide emotional support             -antipsychotic agents: n/a 5. Neuropsych: This patient is capable of making decisions on his own behalf. 6. Skin/Wound Care: Routine skin checks 7. Fluids/Electrolytes/Nutrition: Routine ins and outs and follow-up chemistries 8.  Peripheral polyneuropathy: Continue gabapentin 9.  Obstructive sleep apnea: Continue CPAP at night time and while napping 10: Morbid obesity: Discussed benefits of Mediterranean diet   1/24- BMI 41.82- con't reduction in calories 11: Restless leg syndrome: Continue Mirapex  1/24- will change to 2pm and 7pm per his home dose  12: Benign prostatic hypertrophy: Continue Flomax 13: Muscle spasm: Robaxin prn (was getting Valium on acute side)  1/23- increased robaxin to 2x/day at 3pm and 9pm per pt request and BID prn increased to 1000 mg- max 4x/day.  1/24- doing better today- liked the timing of meds- con't regimen 14: Vitamin B12 deficiency: continue oral supplement  15: RA- Arthritis flare both hands: started on prednisone then weaned off; methotrexate home med held  1/24- restarted MTX q week today and increased prednisone to home dose 10 mg daily.  16: Overactive bladder: continue Vibegron 17: Loose stools versus constipation: continue to monitor>>Imodium PRN/anti-constipation measures. Added prn Dulxolax.  18: History of Vitamin D deficiency: check serum level  1/24- Vit D level 44.45 19: Mild thrombocytopenia: follow-up CBC  1/23- plts down slightly to 136k- will recheck in AM 20. Left knee pain: ice 15 minutes TID. Messaged ortho PA to see if he can get knee aspiration again but pain has improved- can likely defer to later in the week, discussed with ortho PA.   1/23- has already d/w ortho- they don't want to take more fluid off L knee right now-  21. Low back pain, chronic: add kpad.   I spent a total of 41 minutes on  total care- >50% coordination of care- in room with pt and d/w team  conference as well as d/w pharmacy about prednisone/MTX.   LOS: 4 days A FACE TO FACE EVALUATION WAS PERFORMED  Gregoire Bennis 04/24/2021, 9:50 AM

## 2021-04-24 NOTE — Progress Notes (Signed)
Occupational Therapy Session Note  Patient Details  Name: Tony Mcintosh MRN: [id number] Date of Birth: April 27, 1945  Today's Date: 04/24/2021 OT Individual Time: 9244-6286 OT Individual Time Calculation (min): 38 min    Short Term Goals: Week 1:  OT Short Term Goal 1 (Week 1): Pt will complete sit<>stand during functional task with mod A OT Short Term Goal 2 (Week 1): Pt will complete functional transfers with max A OT Short Term Goal 3 (Week 1): Pt will tolerate standing for 2 min to increase endurance for self-care tasks.  Skilled Therapeutic Interventions/Progress Updates:    Pt in wheelchair to start session.  He was agreeable to work on sit to stand from the wheelchair.  Mod assist was needed to complete one sit to stand and maintain with min assist for 2 mins before needing rest.  He reported having greater left knee pain this am with PT that had not been present on previous days.  He reported needing to toilet, so transfer was completed with use of the bariatric stedy.  Max assist for initial sit to stand with use of the [facility] as pt stated that it was too close to him.  Once in the bathroom, he needed mod assist for pushing down underpants and pants.  He was successful with urination, but unable to complete bowel movement.  Therapist did help wipe his buttocks with total assist while he completed sit to stand and standing from the 3:1.  Max assist for clothing management before transferring out to the bed.  Once on the bed, he transitioned to supine with min assist.  Finished session with the call button and phone in reach and safety alarm belt in place.    Therapy Documentation Precautions:  Precautions Precautions: Fall Precaution Comments: L knee buckles Restrictions Weight Bearing Restrictions: No   Pain: Pain Assessment Pain Scale: Faces Faces Pain Scale: Hurts a little bit Pain Type: Acute pain Pain Location: Knee Pain Orientation: Left Pain Descriptors / Indicators:  Discomfort ADL: See Care Tool Section for some details of toileting tasks.   Therapy/Group: Individual Therapy  Tony Mcintosh OTR/L 04/24/2021, 12:55 PM

## 2021-04-24 NOTE — Progress Notes (Signed)
Patient ID: Tony Mcintosh, male   DOB: [birth year], 76 y.o.   MRN: [id number]  SW met with pt and pt wife via facetime to provide updates from team conference, and d/c date 2/8. SW provided updates from [place] in terms of accommodations for truck. Pt provided SW with number for Ethel ext [phone] at [place] office [phone]  stating this is the office he was instructed to contact by [facility]. SW stressed some physical assistance will be required at d/c. SW informed will provide updates once more information on aide and attendance form. Wife also will bring in home measurement sheet and pictures to show therapy team with regard to bathroom concerns. SW provided pt with HHA list for preference.   SW provided [facility] with updates on above. SW will f/u once aware of DME needs (atleast a 1 week prior) on service request form.   Loralee Pacas, MSW, [place] Office: [phone] Cell: [phone] Fax: [phone]

## 2021-04-24 NOTE — Progress Notes (Signed)
Occupational Therapy Session Note  Patient Details  Name: Marden NobleDale R Gervacio MRN: [id number] Date of Birth: 1945-07-10  Today's Date: 04/24/2021 OT Individual Time: 0800-0900 OT Individual Time Calculation (min): 60 min    Short Term Goals: Week 1:  OT Short Term Goal 1 (Week 1): Pt will complete sit<>stand during functional task with mod A OT Short Term Goal 2 (Week 1): Pt will complete functional transfers with max A OT Short Term Goal 3 (Week 1): Pt will tolerate standing for 2 min to increase endurance for self-care tasks.  Skilled Therapeutic Interventions/Progress Updates:    Pt sitting EOB upon arrival and agreeable to therapy. Sit<>stand in [place] with CGA to facilitate transfer to w/c for ADLs. UB bathing/dressing with setup. Pt able to thread pants with setup. Mod A to pull pants over hips when standing in [place]. Pt donned socks and shoes with supervision. Sit<>stand with RW from w/c: min A fading to CGA with last of 3 trials. Extended rest between each trial. Pt remained in w/c and handed off to PT.  Therapy Documentation Precautions:  Precautions Precautions: Fall Precaution Comments: L knee buckles Restrictions Weight Bearing Restrictions: No   Pain: Pt c/o chronic "joint" and back pain "from arthritis"; emotional support    Therapy/Group: Individual Therapy  Rich BraveLanier, Shawn Carattini Chappell 04/24/2021, 11:36 AM

## 2021-04-24 NOTE — Progress Notes (Signed)
Patient is >48 hours without diarrhea, hematochezia, abdominal cramping or fever. Type 4 stools last 2-3 days. Will discontinue enteric precautions for norovirus.

## 2021-04-25 NOTE — Progress Notes (Signed)
Patient to self-administer CPAP HS using his home equipment.  Patient is familiar with equipment and procedure.  Patient instructed to contact RN or RT with any questions or concerns.

## 2021-04-25 NOTE — Progress Notes (Signed)
Occupational Therapy Session Note  Patient Details  Name: Tony Mcintosh R Stan MRN: [id number] Date of Birth: 1946/01/09  Today's Date: 04/25/2021 OT Individual Time: 0700-0810 OT Individual Time Calculation (min): 70 min    Short Term Goals: Week 1:  OT Short Term Goal 1 (Week 1): Pt will complete sit<>stand during functional task with mod A OT Short Term Goal 2 (Week 1): Pt will complete functional transfers with max A OT Short Term Goal 3 (Week 1): Pt will tolerate standing for 2 min to increase endurance for self-care tasks.  Skilled Therapeutic Interventions/Progress Updates:    Pt resting in bed upon arrival and ready to take a shower this morning. Supine>sit EOB with supervision. Sit>stand in [place] with min A from elevated surface. Stedy used for transfer to TTB in shower. Pt completed bathing at shower level with min A using long handle sponge to bathe BLE and back. Pt required assistance washing buttocks when standing in [place]. LB dressing with sit<>stand from w/c using Stedy. Pt able to thread pants over feet but required assistance pulling pants over hips when standing in [place]edy. Demonstrated use of sock aide to don socks. Pt able to don socks without crossing BLE when using sock aide. Pt donned shoes without assistance. Discussed bathroom recommendations. Recommended modification of shower. Recommended zero entry shower without doors to allow w/c to partially roll into shower and pt can stand using grab bars and transfer to seat. Pt in agreement with recommendation. Pt remained in w/c with all needs within reach.  Pain:  Pt reports that pain in Bil hands is better this morning   Therapy/Group: Individual Therapy  Rich BraveLanier, Rozelia Catapano Chappell 04/25/2021, 8:19 AM

## 2021-04-25 NOTE — Progress Notes (Signed)
Patient ID: Marden NobleDale R Doig, male   DOB: 03-24-1946, 76 y.o.   MRN: [id number]  SW left message for clinica staff about scheduling post hospital follow-up with Britt BoozerPCP-Cassidy Shasrp, NP at [facility] (p:[phone]/f:[phone]). SW waiting on follow-up.  *SW spoke [place] who reported that a fax of d/c summary will need to be sent to get an earlier appt in which they will then conduct a d/c f/u phone call and schedule an earlier appointment.   Cecile SheererAuria Elster Corbello, MSW, LCSWA Office: [phone] Cell: [phone] Fax: [phone]

## 2021-04-25 NOTE — Progress Notes (Signed)
Occupational Therapy Session Note  Patient Details  Name: Tony Mcintosh MRN: [id number] Date of Birth: 11/16/1945  Today's Date: 04/25/2021 OT Individual Time: 1300-1355 OT Individual Time Calculation (min): 55 min    Short Term Goals: Week 1:  OT Short Term Goal 1 (Week 1): Pt will complete sit<>stand during functional task with mod A OT Short Term Goal 2 (Week 1): Pt will complete functional transfers with max A OT Short Term Goal 3 (Week 1): Pt will tolerate standing for 2 min to increase endurance for self-care tasks.  Skilled Therapeutic Interventions/Progress Updates:    OT intervention with focus on sit<>stand and standing balance to increase independence with BADLs and functional transfers. Sit<>stand from w/cx5 with CGA. Pt stood at hi-lo table to complete tasks with PVC piping. Pt completed 2 structures also stood at table to clean piping when completed. Pt also stood to toss bean bags at [employer]. CGA for all standing tasks. Pt remained in w/c with all needs within reach.   Therapy Documentation Precautions:  Precautions Precautions: Fall Precaution Comments: L knee buckles Restrictions Weight Bearing Restrictions: No  Pain:  Pt c/o "slight twinge" in Lt knee when standing but resolved with repositioning and seated  Therapy/Group: Individual Therapy  Rich Brave 04/25/2021, 2:21 PM

## 2021-04-25 NOTE — Progress Notes (Signed)
Physical Therapy Session Note  Patient Details  Name: Tony Mcintosh MRN: [id number] Date of Birth: 09-Dec-1945  Today's Date: 04/25/2021 PT Individual Time: 1000-1100 PT Individual Time Calculation (min): 60 min   Short Term Goals: Week 1:  PT Short Term Goal 1 (Week 1): Pt will initiate gait training w/LRAD and max A PT Short Term Goal 2 (Week 1): Pt will perform sit <>stands w/LRAD and mod A PT Short Term Goal 3 (Week 1): Pt will perform bed <>chair transfers w/LRAD and max A  Skilled Therapeutic Interventions/Progress Updates:    Pt received seated in w/c in room, agreeable to PT session. No complaints of pain this AM, does have occasional "twinge" in L knee with mobility that improves at rest. Sit to stand with mod A to RW during session. Ambulation x 25 ft, x 36 ft, x 20 ft with RW and mod A for balance with L knee lightly blocked in stance, close w/c follow for safety. Pt exhibits no buckling of LE during gait and no L knee pain overall. Pt does exhibit increased L lateral lean in stance as well as heavy UE reliance on RW during gait. Pt continues to exhibit improved tolerance for gait training. Introduced stand pivot transfers with RW this date. Pt initially mod A x 1 and SBA x 1 for safety with transfer, improves to mod A x 1 for stand pivot transfer with RW. Pt encouraged by progress this date. Pt left seated in w/c in room with needs in reach, wife present at end of session.  Therapy Documentation Precautions:  Precautions Precautions: Fall Precaution Comments: L knee buckles Restrictions Weight Bearing Restrictions: No    Therapy/Group: Individual Therapy   Excell Seltzer, PT, DPT, CSRS  04/25/2021, 12:20 PM

## 2021-04-26 NOTE — Progress Notes (Signed)
PROGRESS NOTE   Subjective/Complaints:  Pt asked me to go over labs from [place]- from 11/22- went over/reviewed all labs from [place].  Slept great- all night- first night since here.  LBM yesterday.  Hard ot push out, but soft.  Controlling urine.    ROS:   Pt denies SOB, abd pain, CP, N/V/C/D, and vision changes    Objective:   No results found. No results for input(s): WBC, HGB, HCT, PLT in the last 72 hours.  No results for input(s): NA, K, CL, CO2, GLUCOSE, BUN, CREATININE, CALCIUM in the last 72 hours.   Intake/Output Summary (Last 24 hours) at 04/26/2021 0830 Last data filed at 04/26/2021 0550 Gross per 24 hour  Intake 960 ml  Output 1615 ml  Net -655 ml        Physical Exam: Vital Signs Blood pressure 128/66, pulse 69, temperature 97.7 F (36.5 C), resp. rate 16, height 5\' 11"  (1.803 m), weight 136 kg, SpO2 94 %.      General: awake, alert, appropriate, sitting EOB with OTA in room; NAD HENT: conjugate gaze; oropharynx moist CV: regular rate; no JVD Pulmonary: CTA B/L; no W/R/R- good air movement GI: soft, NT, ND, (+)BS; protuberant Psychiatric: appropriate Neurological: Ox3  Musculoskeletal: no muscle spasms seen this AM. Hands more swollen/effusions noted    Cervical back: Rigidity present. Right knee swollen, erythematous, and tender to palpation 5/5 strength with the exception of LLE 4/5- greatly improved as knee swelling is notable- moderate esp suprapatellar, however nothing to compare it to. Has k-tape in place- looks better Neurological:     Mental Status: He is alert and oriented to person, place, and time.     Assessment/Plan: 1. Functional deficits which require 3+ hours per day of interdisciplinary therapy in a comprehensive inpatient rehab setting. Physiatrist is providing close team supervision and 24 hour management of active medical problems listed below. Physiatrist and rehab team  continue to assess barriers to discharge/monitor patient progress toward functional and medical goals  Care Tool:  Bathing  Bathing activity did not occur: Refused Body parts bathed by patient: Right arm, Left arm, Chest, Abdomen, Front perineal area, Right upper leg, Left upper leg, Right lower leg, Face, Left lower leg   Body parts bathed by helper: Buttocks     Bathing assist Assist Level: Minimal Assistance - Patient > 75%     Upper Body Dressing/Undressing Upper body dressing Upper body dressing/undressing activity did not occur (including orthotics): Refused (pt reported he had just changed shirt prior to evaluation) What is the patient wearing?: Pull over shirt    Upper body assist Assist Level: Set up assist    Lower Body Dressing/Undressing Lower body dressing      What is the patient wearing?: Pants, Underwear/pull up     Lower body assist Assist for lower body dressing: Minimal Assistance - Patient > 75%     Toileting Toileting Toileting Activity did not occur Landscape architect and hygiene only): Refused  Toileting assist Assist for toileting: Dependent - Patient 0%     Transfers Chair/bed transfer  Transfers assist  Chair/bed transfer activity did not occur: Refused  Chair/bed transfer assist level: Moderate Assistance -  Patient 50 - 74% (stand pivot with RW)     Locomotion Ambulation   Ambulation assist   Ambulation activity did not occur: Safety/medical concerns (Fatigue, pain)  Assist level: 2 helpers Assistive device: Walker-rolling Max distance: 36'   Walk 10 feet activity   Assist  Walk 10 feet activity did not occur: Safety/medical concerns (Fatigue, pain)  Assist level: 2 helpers Assistive device: Walker-rolling   Walk 50 feet activity   Assist Walk 50 feet with 2 turns activity did not occur: Safety/medical concerns (Fatigue, pain)         Walk 150 feet activity   Assist Walk 150 feet activity did not occur:  Safety/medical concerns (Fatigue, pain)         Walk 10 feet on uneven surface  activity   Assist Walk 10 feet on uneven surfaces activity did not occur: Safety/medical concerns (Fatigue, pain)         Wheelchair     Assist Is the patient using a wheelchair?: Yes Type of Wheelchair: Manual    Wheelchair assist level: Dependent - Patient 0%      Wheelchair 50 feet with 2 turns activity    Assist        Assist Level: Dependent - Patient 0%   Wheelchair 150 feet activity     Assist      Assist Level: Dependent - Patient 0%   Blood pressure 128/66, pulse 69, temperature 97.7 F (36.5 C), resp. rate 16, height 5\' 11"  (1.803 m), weight 136 kg, SpO2 94 %.    Medical Problem List and Plan: 1. 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 IVIG x 5 days completed.             -patient may shower             -ELOS/Goals: minA 14-18 days           1/26- D/c 05/09/21 Con't CIR_ PT and OT 2.  Antithrombotics: -DVT/anticoagulation:  Pharmaceutical: Lovenox             -antiplatelet therapy: none 3. Left thumb pain: Ice 15 minutes TID. Tylenol. Spreading to right hand as well- appears to be his rheumatoid arthritis. Added voltaren gel. Increase prednisone to 5mg . Consider consulting his rheumatologist regarding potential treatment options  1/23- L/m for pt's Rheum about his prednisone/MTX- refused MTX last week when ordered, of note. 1/24- changed MTX to start today 17.5 mg qweek; got OK from Rheum; also increased Prednisone to his home dose of 10 mg daily.   1/26- feeling better- hands hurting less and L knee- con't regimen 4. Mood: LCSW to evlauate and provide emotional support             -antipsychotic agents: n/a 5. Neuropsych: This patient is capable of making decisions on his own behalf. 6. Skin/Wound Care: Routine skin checks 7.  Fluids/Electrolytes/Nutrition: Routine ins and outs and follow-up chemistries 8.  Peripheral polyneuropathy: Continue gabapentin 9.  Obstructive sleep apnea: Continue CPAP at night time and while napping 10: Morbid obesity: Discussed benefits of Mediterranean diet   1/24- BMI 41.82- con't reduction in calories 11: Restless leg syndrome: Continue Mirapex  1/24- will change to 2pm and 7pm per his home dose 12: Benign prostatic hypertrophy: Continue Flomax 13: Muscle spasm: Robaxin prn (was getting Valium on acute side)  1/23- increased robaxin to 2x/day at 3pm and 9pm per pt request and BID  prn increased to 1000 mg- max 4x/day.  1/26- Muscle spasms controlled- con't regimen 14: Vitamin B12 deficiency: continue oral supplement  15: RA- Arthritis flare both hands: started on prednisone then weaned off; methotrexate home med held  1/24- restarted MTX q week today and increased prednisone to home dose 10 mg daily.  16: Overactive bladder: continue Vibegron 17: Loose stools versus constipation: continue to monitor>>Imodium PRN/anti-constipation measures. Added prn Dulxolax.  18: History of Vitamin D deficiency: check serum level  1/24- Vit D level 44.45 19: Mild thrombocytopenia: follow-up CBC  1/23- plts down slightly to 136k- will recheck in AM 20. Left knee pain: ice 15 minutes TID. Messaged ortho PA to see if he can get knee aspiration again but pain has improved- can likely defer to later in the week, discussed with ortho PA.   1/23- has already d/w ortho- they don't want to take more fluid off L knee right now-   1/26- much better with K-taping-  21. Low back pain, chronic: add kpad.    LOS: 6 days A FACE TO FACE EVALUATION WAS PERFORMED  Tony Mcintosh 04/26/2021, 8:30 AM

## 2021-04-26 NOTE — Progress Notes (Signed)
Physical Therapy Session Note  Patient Details  Name: Tony Mcintosh MRN: [id number] Date of Birth: May 15, 1945  Today's Date: 04/26/2021 PT Individual Time: 1020-1125 PT Individual Time Calculation (min): 65 min   Short Term Goals: Week 1:  PT Short Term Goal 1 (Week 1): Pt will initiate gait training w/LRAD and max A PT Short Term Goal 2 (Week 1): Pt will perform sit <>stands w/LRAD and mod A PT Short Term Goal 3 (Week 1): Pt will perform bed <>chair transfers w/LRAD and max A  Skilled Therapeutic Interventions/Progress Updates: Pt presented in w/c agreeable to therapy. Pt states some stiffness in L knee as has been sitting in chair for approx 2 hours. Pt propelled to entrance of room with supervision while PTA performed hand hygiene. Pt transported to rehab gym for energy conservation. Performed stand pivot transfer with RW with minA. Pt was able to power up to stand with CGA. At [facility] pt participated in numerous Sit to stands from starting 22in with RW and use of yoga blocks to use as arm rests. Pt was able to perform x 4 with CGA overall. PTA then lowered to 21in with pt initially requiring minA but progressing to CGA with noted increased effort for power up. Pt then participated in ambulation 6f x 2 with minA and w/c follow for safety. PTA blocked L knee however minimal instability noted and pt encouraged to increase quad activation in stance phase as well as improved erect posture. Pt did comment that when cued to contract LLE in stance phase felt more secure. Discussed option of trying recliner when returning to room vs w/c for more comfort with pt willing to try. Pt transported back to room and set up with recliner next to bed and w/c cushion placed at seat. Pt was able to perform ambulatory transfer to recliner with CGA overall. Pt instructed in mechanics of recliner and was repositioned for comfort. Prior to end of session pt with questions regarding MyChart, provided customer support  number for follow up. Pt left in recliner at end of session with call bell within reach and needs met.      Therapy Documentation Precautions:  Precautions Precautions: Fall Precaution Comments: L knee buckles Restrictions Weight Bearing Restrictions: No General:   Vital Signs: Therapy Vitals Temp: 98.1 F (36.7 C) Pulse Rate: 80 Resp: 18 BP: 126/66 Patient Position (if appropriate): Sitting Oxygen Therapy SpO2: 94 % O2 Device: Room Air Pain: Pain Assessment Pain Scale: 0-10 Pain Score: 1  Pain Location: Foot Pain Orientation: Right;Left Pain Descriptors / Indicators: Sore Patients Stated Pain Goal: 0 Mobility:   Locomotion :    Trunk/Postural Assessment :    Balance:   Exercises:   Other Treatments:      Therapy/Group: Individual Therapy  Sumeet Geter 04/26/2021, 4:04 PM

## 2021-04-26 NOTE — Progress Notes (Signed)
Physical Therapy Session Note  Patient Details  Name: Tony Mcintosh MRN: [id number] Date of Birth: [birth year]  Today's Date: 04/26/2021 PT Individual Time: 1535-1620 PT Individual Time Calculation (min): 45 min   Short Term Goals: Week 1:  PT Short Term Goal 1 (Week 1): Pt will initiate gait training w/LRAD and max A PT Short Term Goal 2 (Week 1): Pt will perform sit <>stands w/LRAD and mod A PT Short Term Goal 3 (Week 1): Pt will perform bed <>chair transfers w/LRAD and max A  Skilled Therapeutic Interventions/Progress Updates:   Pt received sitting in WC and agreeable to PT. WC mobility x 63f with min assist to prevent veer to the R in hall cues for awareness of R drift with milfd ability to correct. Weakness in wrist and thump due to arthritis limiting per pt. Pt performed sit<>stand x 5 throughout session with mod assist on first bout progressing to min assist with BUE on arm rest of mat table to initiate transfers. Gait training with RW 2 x 655fand 3021fith min assist for safety and cues for improved posture to allow terminal knee extension on the LLE. No pain and knee instability noted on the LLE on this day, but BLE with foot flat initial contact L>R. Patient returned to room and left sitting  on toilet for BM NT aware, with call bell in reach and all needs met.         Therapy Documentation Precautions:  Precautions Precautions: Fall Precaution Comments: L knee buckles Restrictions Weight Bearing Restrictions: No    Pain: Pain Assessment Pain Scale: 0-10 Pain Score: 1  Pain Location: Foot Pain Orientation: Right;Left Pain Descriptors / Indicators: Sore Patients Stated Pain Goal: 0    Therapy/Group: Individual Therapy  AusLorie Phenix26/2023, 6:04 PM

## 2021-04-26 NOTE — Progress Notes (Signed)
Occupational Therapy Session Note  Patient Details  Name: Tony Mcintosh MRN: [id number] Date of Birth: [birth year]  Today's Date: 04/26/2021 OT Individual Time: 0700-0810 OT Individual Time Calculation (min): 70 min    Short Term Goals: Week 1:  OT Short Term Goal 1 (Week 1): Pt will complete sit<>stand during functional task with mod A OT Short Term Goal 2 (Week 1): Pt will complete functional transfers with max A OT Short Term Goal 3 (Week 1): Pt will tolerate standing for 2 min to increase endurance for self-care tasks.  Skilled Therapeutic Interventions/Progress Updates:    Pt sitting EOB upon arrival. OT intervention with focus on sit<>stand, standing balance, dressing with sit<>stand from w/c, functional amb with RW in room, toilet transfers, activity tolerance, and safety awareness to increase independence with BADLs. Initial sit<>stand from EOB and w/c with min A fading to CGA with increase activity. Pt using AE (reacher, sock aide, and shoe funnel) to assist with LB dressing. Pt able to pull pants over hips while standing with RW-CGA. Pt amb with RW into bathroom with CGA and transferred to [facility] over toilet. Pt remained seated on BSC. NT notified. Pt requires multiple short rest breaks during ADLs. Pt pleased with progress this week.   Therapy Documentation Precautions:  Precautions Precautions: Fall Precaution Comments: L knee buckles Restrictions Weight Bearing Restrictions: No Pain:  Pt reports that Lt knee continues to improve   Therapy/Group: Individual Therapy  Rich Brave 04/26/2021, 8:22 AM

## 2021-04-26 NOTE — Progress Notes (Signed)
Occupational Therapy Session Note  Patient Details  Name: Tony Mcintosh MRN: [id number] Date of Birth: 04-10-1945  Today's Date: 04/26/2021 OT Individual Time: 1300-1330 OT Individual Time Calculation (min): 30 min    Short Term Goals: Week 1:  OT Short Term Goal 1 (Week 1): Pt will complete sit<>stand during functional task with mod A OT Short Term Goal 2 (Week 1): Pt will complete functional transfers with max A OT Short Term Goal 3 (Week 1): Pt will tolerate standing for 2 min to increase endurance for self-care tasks.  Skilled Therapeutic Interventions/Progress Updates:    Pt resting in recliner. Sit<>stand from recliner with CGA and amb with RW to sink. Pt sat back into w/c at sink. Sit<>stand from w/c at sink x 3 and simulated grooming tasks while standing at sink with CGA. Pt remained in w/c with all needs within reach.  Therapy Documentation Precautions:  Precautions Precautions: Fall Precaution Comments: L knee buckles Restrictions Weight Bearing Restrictions: No   Pain:  Pt continues to report that his Lt knee continues to improve.    Therapy/Group: Individual Therapy  Rich Brave 04/26/2021, 1:33 PM

## 2021-04-26 NOTE — Progress Notes (Signed)
Patient ID: Tony Mcintosh, male   DOB: 10-Dec-1945, 76 y.o.   MRN: [id number]  SW faxed aid and attendance form to [place] [phone].   SW provided pt with forms for his records.   Loralee Pacas, MSW, [place] Office: [phone] Cell: [phone] Fax: [phone]

## 2021-04-27 LAB — CBC
HCT: 36.7 % — ABNORMAL LOW (ref 39.0–52.0)
Hemoglobin: 12.7 g/dL — ABNORMAL LOW (ref 13.0–17.0)
MCH: 32.2 pg (ref 26.0–34.0)
MCHC: 34.6 g/dL (ref 30.0–36.0)
MCV: 92.9 fL (ref 80.0–100.0)
Platelets: 218 10*3/uL (ref 150–400)
RBC: 3.95 MIL/uL — ABNORMAL LOW (ref 4.22–5.81)
RDW: 13.2 % (ref 11.5–15.5)
WBC: 3.7 10*3/uL — ABNORMAL LOW (ref 4.0–10.5)
nRBC: 0 % (ref 0.0–0.2)

## 2021-04-27 LAB — BASIC METABOLIC PANEL
Anion gap: 6 (ref 5–15)
BUN: 18 mg/dL (ref 8–23)
CO2: 26 mmol/L (ref 22–32)
Calcium: 8.5 mg/dL — ABNORMAL LOW (ref 8.9–10.3)
Chloride: 101 mmol/L (ref 98–111)
Creatinine, Ser: 0.93 mg/dL (ref 0.61–1.24)
GFR, Estimated: >60 mL/min (ref >60)
Glucose, Bld: 98 mg/dL (ref 70–99)
Potassium: 3.9 mmol/L (ref 3.5–5.1)
Sodium: 133 mmol/L — ABNORMAL LOW (ref 135–145)

## 2021-04-27 NOTE — Progress Notes (Signed)
Pt home CPAP machine. Pt stated that he will place the CPAP on when he is ready to sleep.

## 2021-04-27 NOTE — Progress Notes (Signed)
Occupational Therapy Session Note  Patient Details  Name: Tony Mcintosh MRN: [id number] Date of Birth: 07/17/1945  Today's Date: 04/27/2021 OT Individual Time: [phone] OT Individual Time Calculation (min): 71 min    Short Term Goals: Week 2:  OT Short Term Goal 1 (Week 2): Pt will complete toileting tasks with mod A OT Short Term Goal 2 (Week 2): Pt will complete bathing at shower level with min A OT Short Term Goal 3 (Week 2): Pt will maintain standing balance with CGA when pulling pants over hips  Skilled Therapeutic Interventions/Progress Updates:    Pt seated EOB upon arrival. OT intervention with focus on functional amb with RW, bathing at shower level, dressing with sit<>stand from w/c, standing balance in shower and at sink, educaiton, and safety awareness to increase independence with BADLs. Pt amb with RW into bathroom (CGA) and tranfserred to TTB in shower. Pt requires assistance with bathing buttocks. Pt uses long handle sponge to assist with lower LE. Standing balance in shower with CGA. Pt returned to room and completed dressing with sti<>stand from w/c. Pt uses AE to assist with LB dressing. CGA for standing balance. Pt amb with RW to sink and stood at sink to brush teeth. CGA for balance. Demonstrated TTB to pt in the even [facility] is unable to modify shower arrangement. Pt returned to room. Pt remained in w/c with NT present.   Therapy Documentation Precautions:  Precautions Precautions: Fall Precaution Comments: L knee buckles Restrictions Weight Bearing Restrictions: No Pain: Pt with no reports of pain. Pt reports stiffness in joints; activity and warm shower  Therapy/Group: Individual Therapy  Leroy Libman 04/27/2021, 8:14 AM

## 2021-04-27 NOTE — Progress Notes (Signed)
Occupational Therapy Weekly Progress Note  Patient Details  Name: Tony Mcintosh MRN: [id number] Date of Birth: 1945/09/24  Beginning of progress report period: April 21, 2021 End of progress report period: April 29, 2021  Patient has met 3 of 3 short term goals.  Pt has made steady progress with BADLs and transfers since admission. Pt currently requires min A for bathing and LB dressing tasks. Min A/CGA for standing balance during BADLs. Pt is using AE (reacher, shoe funnel, sock aide, and long handle sponge) to assist with LB bathing/dressing tasks. Sit<>stand fluctuates from mod A to CGA. Functional transfers with min A/CGA. Pt fatigues easily and requires multiple rest breaks during BADLs. LTG to be upgraded.  Patient continues to demonstrate the following deficits: muscle weakness, decreased cardiorespiratoy endurance, and decreased standing balance and therefore will continue to benefit from skilled OT intervention to enhance overall performance with BADL.  Patient progressing toward long term goals and exceeding therapeutic expectations. Please see LTGs for adjustments.   OT Short Term Goals Week 1:  OT Short Term Goal 1 (Week 1): Pt will complete sit<>stand during functional task with mod A OT Short Term Goal 1 - Progress (Week 1): Met OT Short Term Goal 2 (Week 1): Pt will complete functional transfers with max A OT Short Term Goal 2 - Progress (Week 1): Met OT Short Term Goal 3 (Week 1): Pt will tolerate standing for 2 min to increase endurance for self-care tasks. OT Short Term Goal 3 - Progress (Week 1): Met Week 2:  OT Short Term Goal 1 (Week 2): Pt will complete toileting tasks with mod A OT Short Term Goal 2 (Week 2): Pt will complete bathing at shower level with min A OT Short Term Goal 3 (Week 2): Pt will maintain standing balance with CGA when pulling pants over hips   Leroy Libman 04/27/2021, 6:53 AM

## 2021-04-27 NOTE — Progress Notes (Signed)
Physical Therapy Session Note  Patient Details  Name: Tony Mcintosh MRN: [id number] Date of Birth: 01-22-1946  Today's Date: 04/27/2021 PT Individual Time: 0904-1000 and 1405-1500 PT Individual Time Calculation (min): 56 min and 55 min  Short Term Goals: Week 1:  PT Short Term Goal 1 (Week 1): Pt will initiate gait training w/LRAD and max A PT Short Term Goal 2 (Week 1): Pt will perform sit <>stands w/LRAD and mod A PT Short Term Goal 3 (Week 1): Pt will perform bed <>chair transfers w/LRAD and max A  Skilled Therapeutic Interventions/Progress Updates:   First session:  Pt presents sitting in w/c and agreeable to therapy.  OT discussed pts need for improved sup <> sit transfers as pt states "not doing as well".  Pt transferred sit to stand w/ mod A.  Pt amb to bed w/ bed at home height for transfers practice using log roll technique.  Pt performed sit to L sidelying w/ min to CGA for LLE but improved to CGA w/verbal cues for sequencing, especially moving all 4 extremities at same time to utilize momentum for legs.  Pt performed x 3 trials w/ improvement stated.  Pt wheeled using UEs x  10' but c/o arthritic pain in hands, PT completed into small gym.  Pt performed multiple standing marches using mirror for visual feedback for placement of LEs w/ stepping.  Pt requires occasional verbal cues for knee extensor activation on L.  Pt performed abd/add using back of chair for stability.  Pt returned to room and remained sitting in w/c w/ all needs in reach.  Second session:  Pt presents sitting in w/c w/ spouse present and agreeable to therapy.  Spouse mentions increased rubor to R foot, especially plantar surface as opposed to left foot.  Noted rubor and waxy look to dorsum of first 3 toes.  Nursing notified and observed and will monitor.  Pt has no pain in foot, just feels stiff.  Left ankle appears slightly swollen, but not significantly.  Pt wheeled to main gym for time and energy conservation.   Pt transfers sit to stand w/ mod to min A w/ verbal cues for sequencing from w/c and mat table.  Pt amb multiple trials of 60', including turn to approach seated surface w/ CGA only.  Pt requires occasional verbal cues for posture and weight acceptance to L LE.  Pt cued on turns w/ safe manipulation of RW.  Pt requires extended seated rest breaks 2/2 fatigue.  Pt states decreasing reliance on UEs approximately only 10%.  Pt returned to room and wished to remain seated in w/c.  All needs in reach.     Therapy Documentation Precautions:  Precautions Precautions: Fall Precaution Comments: L knee buckles Restrictions Weight Bearing Restrictions: No General:   Vital Signs:   Pain:0/10, feet sore. Pain Assessment Pain Scale: 0-10 Pain Score: 2  Faces Pain Scale: No hurt Pain Type: Acute pain Pain Location: Knee Pain Orientation: Left Pain Descriptors / Indicators: Aching Pain Frequency: Intermittent Pain Onset: On-going Patients Stated Pain Goal: 0 Pain Intervention(s): Medication (See eMAR) Multiple Pain Sites: No Mobility:   Other Treatments:      Therapy/Group: Individual Therapy  [name] 04/27/2021, 10:04 AM

## 2021-04-27 NOTE — Progress Notes (Signed)
PROGRESS NOTE   Subjective/Complaints:  Pt reports feetso cold- painful.  Warming them up with kpad.  Needs tylenol- woke up late and hasn't gotten over AM stiffness yet.  Sleeping pills help him sleep much better.   ROS:   Pt denies SOB, abd pain, CP, N/V/C/D, and vision changes   Objective:   No results found. Recent Labs    04/27/21 0505  WBC 3.7*  HGB 12.7*  HCT 36.7*  PLT 218    Recent Labs    04/27/21 0505  NA 133*  K 3.9  CL 101  CO2 26  GLUCOSE 98  BUN 18  CREATININE 0.93  CALCIUM 8.5*     Intake/Output Summary (Last 24 hours) at 04/27/2021 1242 Last data filed at 04/26/2021 2100 Gross per 24 hour  Intake 700 ml  Output 200 ml  Net 500 ml        Physical Exam: Vital Signs Blood pressure 126/70, pulse 63, temperature 97.7 F (36.5 C), resp. rate 17, height 5\' 11"  (1.803 m), weight 136 kg, SpO2 94 %.       General: awake, alert, appropriate, laying supine in bed; NAD HENT: conjugate gaze; oropharynx moist CV: regular rate; no JVD Pulmonary: CTA B/L; no W/R/R- good air movement GI: soft, NT, ND, (+)BS- protuberant;  Psychiatric: appropriate Neurological: Ox3;  Musculoskeletal: no muscle spasms seen this AM.= hands less swollen/feet cold kpad on feet.     Cervical back: Rigidity present. Right knee swollen, erythematous, and tender to palpation 5/5 strength with the exception of LLE 4/5- greatly improved as knee swelling is notable- moderate esp suprapatellar, however nothing to compare it to. Has k-tape in place- looks better Neurological:     Mental Status: He is alert and oriented to person, place, and time.     Assessment/Plan: 1. Functional deficits which require 3+ hours per day of interdisciplinary therapy in a comprehensive inpatient rehab setting. Physiatrist is providing close team supervision and 24 hour management of active medical problems listed below. Physiatrist and  rehab team continue to assess barriers to discharge/monitor patient progress toward functional and medical goals  Care Tool:  Bathing  Bathing activity did not occur: Refused Body parts bathed by patient: Right arm, Left arm, Chest, Abdomen, Front perineal area, Right upper leg, Left upper leg, Right lower leg, Face, Left lower leg   Body parts bathed by helper: Buttocks     Bathing assist Assist Level: Minimal Assistance - Patient > 75%     Upper Body Dressing/Undressing Upper body dressing Upper body dressing/undressing activity did not occur (including orthotics): Refused (pt reported he had just changed shirt prior to evaluation) What is the patient wearing?: Pull over shirt    Upper body assist Assist Level: Set up assist    Lower Body Dressing/Undressing Lower body dressing      What is the patient wearing?: Pants, Underwear/pull up     Lower body assist Assist for lower body dressing: Contact Guard/Touching assist     Toileting Toileting Toileting Activity did not occur (Clothing management and hygiene only): Refused  Toileting assist Assist for toileting: Dependent - Patient 0%     Transfers Chair/bed transfer  Transfers  assist  Chair/bed transfer activity did not occur: Refused  Chair/bed transfer assist level: Moderate Assistance - Patient 50 - 74% (stand pivot with RW)     Locomotion Ambulation   Ambulation assist   Ambulation activity did not occur: Safety/medical concerns (Fatigue, pain)  Assist level: 2 helpers Assistive device: Walker-rolling Max distance: 36'   Walk 10 feet activity   Assist  Walk 10 feet activity did not occur: Safety/medical concerns (Fatigue, pain)  Assist level: 2 helpers Assistive device: Walker-rolling   Walk 50 feet activity   Assist Walk 50 feet with 2 turns activity did not occur: Safety/medical concerns (Fatigue, pain)         Walk 150 feet activity   Assist Walk 150 feet activity did not occur:  Safety/medical concerns (Fatigue, pain)         Walk 10 feet on uneven surface  activity   Assist Walk 10 feet on uneven surfaces activity did not occur: Safety/medical concerns (Fatigue, pain)         Wheelchair     Assist Is the patient using a wheelchair?: Yes Type of Wheelchair: Manual    Wheelchair assist level: Dependent - Patient 0%      Wheelchair 50 feet with 2 turns activity    Assist        Assist Level: Dependent - Patient 0%   Wheelchair 150 feet activity     Assist      Assist Level: Dependent - Patient 0%   Blood pressure 126/70, pulse 63, temperature 97.7 F (36.5 C), resp. rate 17, height  (1.803 m), weight 136 kg, SpO2 94 %.    Medical Problem List and Plan: 1. 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 IVIG x 5 days completed.             -patient may shower             -ELOS/Goals: minA 14-18 days           - D/c 05/09/21  1/27- cn't PT and OT- CIR 2.  Antithrombotics: -DVT/anticoagulation:  Pharmaceutical: Lovenox             -antiplatelet therapy: none 3. Left thumb pain: Ice 15 minutes TID. Tylenol. Spreading to right hand as well- appears to be his rheumatoid arthritis. Added voltaren gel. Increase prednisone to . Consider consulting his rheumatologist regarding potential treatment options  1/23- L/m for pt's Rheum about his prednisone/MTX- refused MTX last week when ordered, of note. 1/24- changed MTX to start today 17.5 mg qweek; got OK from Rheum; also increased Prednisone to his home dose of 10 mg daily.   1/27- pain overall doing better- but stiff this AM- taing tylenol prn 4. Mood: LCSW to evlauate and provide emotional support             -antipsychotic agents: n/a 5. Neuropsych: This patient is capable of making decisions on his own behalf. 6. Skin/Wound Care: Routine skin checks 7.  Fluids/Electrolytes/Nutrition: Routine ins and outs and follow-up chemistries 8.  Peripheral polyneuropathy: Continue gabapentin 9.  Obstructive sleep apnea: Continue CPAP at night time and while napping 10: Morbid obesity: Discussed benefits of Mediterranean diet   1/24- BMI 41.82- con't reduction in calories 11: Restless leg syndrome: Continue Mirapex  1/24- will change to 2pm and 7pm per his home dose  1/27- working much better- con't regimen 12: Benign prostatic hypertrophy: Continue  Flomax 13: Muscle spasm: Robaxin prn (was getting Valium on acute side)  1/23- increased robaxin to 2x/day at 3pm and 9pm per pt request and BID prn increased to 1000 mg- max 4x/day.  1/26- Muscle spasms controlled- con't regimen 14: Vitamin B12 deficiency: continue oral supplement  15: RA- Arthritis flare both hands: started on prednisone then weaned off; methotrexate home med held  1/24- restarted MTX q week today and increased prednisone to home dose 10 mg daily.   1/27- doing much better overall- still Am stiffness 16: Overactive bladder: continue Vibegron 17: Loose stools versus constipation: continue to monitor>>Imodium PRN/anti-constipation measures. Added prn Dulxolax.  18: History of Vitamin D deficiency: check serum level  1/24- Vit D level 44.45 19: Mild thrombocytopenia: follow-up CBC  1/23- plts down slightly to 136k- will recheck in AM 20. Left knee pain: ice 15 minutes TID. Messaged ortho PA to see if he can get knee aspiration again but pain has improved- can likely defer to later in the week, discussed with ortho PA.   1/23- has already d/w ortho- they don't want to take more fluid off L knee right now-   1/26- much better with K-taping-  21. Low back pain, chronic: add kpad. 22. Cold feet/neuropathy  1/27- con't warming feet with Kpad if need be- wait on nerve pain meds per pt request.  22. Leukopenia  1/27- Down to 3.7- not sure cause- just under normal- will recheck Monday and could  be due to prednisone?    LOS: 7 days A FACE TO FACE EVALUATION WAS PERFORMED  Tony Mcintosh 04/27/2021, 12:42 PM

## 2021-04-28 DIAGNOSIS — M069 Rheumatoid arthritis, unspecified: Secondary | ICD-10-CM

## 2021-04-28 DIAGNOSIS — G4733 Obstructive sleep apnea (adult) (pediatric): Secondary | ICD-10-CM

## 2021-04-28 NOTE — Progress Notes (Signed)
PROGRESS NOTE   Subjective/Complaints:  Less pain, feet still feel cold. Wife wonders why right foot is different "colored" than left. Pt notes more weakness on left side  ROS: Patient denies fever, rash, sore throat, blurred vision, dizziness, nausea, vomiting, diarrhea, cough, shortness of breath or chest pain,   headache, or mood change.    Objective:   No results found. Recent Labs    04/27/21 0505  WBC 3.7*  HGB 12.7*  HCT 36.7*  PLT 218    Recent Labs    04/27/21 0505  NA 133*  K 3.9  CL 101  CO2 26  GLUCOSE 98  BUN 18  CREATININE 0.93  CALCIUM 8.5*     Intake/Output Summary (Last 24 hours) at 04/28/2021 1018 Last data filed at 04/28/2021 0600 Gross per 24 hour  Intake 800 ml  Output 1350 ml  Net -550 ml        Physical Exam: Vital Signs Blood pressure 111/79, pulse 94, temperature 97.7 F (36.5 C), temperature source Oral, resp. rate 18, height 5\' 11"  (1.803 m), weight (!) 138.3 kg, SpO2 97 %.       Constitutional: No distress . Vital signs reviewed. obese HEENT: NCAT, EOMI, oral membranes moist Neck: supple Cardiovascular: RRR without murmur. No JVD    Respiratory/Chest: CTA Bilaterally without wheezes or rales. Normal effort    GI/Abdomen: BS +, non-tender, non-distended Psych: pleasant and cooperative  Musculoskeletal: no muscle spasms seen this AM.= hands less swollen/feet cold kpad on feet.     Cervical back: Rigidity present. Right knee swollen,  5/5 strength with the exception of LLE 4/5- greatly improved as knee swelling is notable- moderate esp suprapatellar, however nothing to compare it to. Left leg with less muscle girth than right side Neurological:     Mental Status: He is alert and oriented to person, place, and time.  Skin/ext::right foot with a little more blood pooling than left. Both feet warm to touch with norma cap refill   Assessment/Plan: 1. Functional deficits  which require 3+ hours per day of interdisciplinary therapy in a comprehensive inpatient rehab setting. Physiatrist is providing close team supervision and 24 hour management of active medical problems listed below. Physiatrist and rehab team continue to assess barriers to discharge/monitor patient progress toward functional and medical goals  Care Tool:  Bathing  Bathing activity did not occur: Refused Body parts bathed by patient: Right arm, Left arm, Chest, Abdomen, Front perineal area, Right upper leg, Left upper leg, Right lower leg, Face, Left lower leg   Body parts bathed by helper: Buttocks     Bathing assist Assist Level: Minimal Assistance - Patient > 75%     Upper Body Dressing/Undressing Upper body dressing Upper body dressing/undressing activity did not occur (including orthotics): Refused (pt reported he had just changed shirt prior to evaluation) What is the patient wearing?: Pull over shirt    Upper body assist Assist Level: Set up assist    Lower Body Dressing/Undressing Lower body dressing      What is the patient wearing?: Pants, Underwear/pull up     Lower body assist Assist for lower body dressing: Contact Guard/Touching assist  Toileting Toileting Toileting Activity did not occur Press photographer and hygiene only): Refused  Toileting assist Assist for toileting: Dependent - Patient 0%     Transfers Chair/bed transfer  Transfers assist  Chair/bed transfer activity did not occur: Refused  Chair/bed transfer assist level: Moderate Assistance - Patient 50 - 74% (stand pivot with RW)     Locomotion Ambulation   Ambulation assist   Ambulation activity did not occur: Safety/medical concerns (Fatigue, pain)  Assist level: Contact Guard/Touching assist Assistive device: Walker-rolling Max distance: 60   Walk 10 feet activity   Assist  Walk 10 feet activity did not occur: Safety/medical concerns (Fatigue, pain)  Assist level: Contact  Guard/Touching assist Assistive device: Walker-rolling   Walk 50 feet activity   Assist Walk 50 feet with 2 turns activity did not occur: Safety/medical concerns (Fatigue, pain)  Assist level: Contact Guard/Touching assist Assistive device: Walker-rolling    Walk 150 feet activity   Assist Walk 150 feet activity did not occur: Safety/medical concerns         Walk 10 feet on uneven surface  activity   Assist Walk 10 feet on uneven surfaces activity did not occur: Safety/medical concerns (Fatigue, pain)         Wheelchair     Assist Is the patient using a wheelchair?: Yes Type of Wheelchair: Manual    Wheelchair assist level: Dependent - Patient 0%      Wheelchair 50 feet with 2 turns activity    Assist        Assist Level: Dependent - Patient 0%   Wheelchair 150 feet activity     Assist      Assist Level: Dependent - Patient 0%   Blood pressure 111/79, pulse 94, temperature 97.7 F (36.5 C), temperature source Oral, resp. rate 18, height  (1.803 m), weight (!) 138.3 kg, SpO2 97 %.    Medical Problem List and Plan: 1. 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 IVIG x 5 days completed.             -patient may shower             -ELOS/Goals: minA 14-18 days           - D/c 05/09/21  -Continue CIR therapies including PT, OT  2.  Antithrombotics: -DVT/anticoagulation:  Pharmaceutical: Lovenox             -antiplatelet therapy: none 3. Left thumb pain: Ice 15 minutes TID. Tylenol. Spreading to right hand as well- appears to be his rheumatoid arthritis. Added voltaren gel. Increase prednisone to . Consider consulting his rheumatologist regarding potential treatment options  1/23- L/m for pt's Rheum about his prednisone/MTX- refused MTX last week when ordered, of note. 1/24- changed MTX to start today 17.5 mg qweek;  got OK from Rheum; also increased Prednisone to his home dose of 10 mg daily.   1/28- pain under reasonable control at present 4. Mood: LCSW to evlauate and provide emotional support             -antipsychotic agents: n/a 5. Neuropsych: This patient is capable of making decisions on his own behalf. 6. Skin/Wound Care: Routine skin checks 7. Fluids/Electrolytes/Nutrition: Routine ins and outs and follow-up chemistries 8.  Peripheral polyneuropathy: Continue gabapentin 9.  Obstructive sleep apnea: Continue CPAP at night time and while napping 10: Morbid obesity: Discussed benefits of Mediterranean diet   1/24-  BMI 41.82- con't reduction in calories 11: Restless leg syndrome: Continue Mirapex  1/24- will change to 2pm and 7pm per his home dose  1/27- working much better- con't regimen 12: Benign prostatic hypertrophy: Continue Flomax 13: Muscle spasm: Robaxin prn (was getting Valium on acute side)  1/23- increased robaxin to 2x/day at 3pm and 9pm per pt request and BID prn increased to 1000 mg- max 4x/day.  1/28- Muscle spasms controlled- con't regimen 14: Vitamin B12 deficiency: continue oral supplement  15: RA- Arthritis flare both hands: started on prednisone then weaned off; methotrexate home med held  1/24- restarted MTX q week today and increased prednisone to home dose 10 mg daily.   1/27- doing much better overall- still Am stiffness 16: Overactive bladder: continue Vibegron 17: Loose stools versus constipation: continue to monitor>>Imodium PRN/anti-constipation measures. Added prn Dulxolax.  18: History of Vitamin D deficiency: check serum level  1/24- Vit D level 44.45 19: Mild thrombocytopenia: follow-up CBC  1/23- plts down slightly to 136k- will recheck in AM 20. Left knee pain: ice 15 minutes TID. Messaged ortho PA to see if he can get knee aspiration again but pain has improved- can likely defer to later in the week, discussed with ortho PA.   1/23- has already d/w ortho-  they don't want to take more fluid off L knee right now-   1/28 pain appears improved-  21. Low back pain, chronic: add kpad. 22. Cold feet/neuropathy  1/28 discussed with patient that circulation appeared to be adequate in both feet. "Cold" sensation is more related to his neuropathy.Mild color difference might be related to neurological factors, positioning, etc.   22. Leukopenia  1/27- Down to 3.7- not sure cause- just under normal- will recheck Monday and could be due to prednisone?    LOS: 8 days A FACE TO FACE EVALUATION WAS PERFORMED  Ranelle Oyster 04/28/2021, 10:18 AM

## 2021-04-28 NOTE — Progress Notes (Signed)
Occupational Therapy Session Note  Patient Details  Name: Tony Mcintosh MRN: [id number] Date of Birth: [birth year]  Today's Date: 04/29/2021 OT Individual Time: 1449-1531 OT Individual Time Calculation (min): 42 min   Skilled Therapeutic Interventions/Progress Updates:    Pt greeted in the w/c with no c/o pain. He showed OT photos from home pertaining to his bathroom setup. Pts toilet is ~17.5 inches high. The standard toilet in his CIR bathroom is ~16.5 inches high. Pt used the RW to complete toilet transfer. Pt completed x5 sit<stands from standard toilet, using bilateral grab bars (which he also has at home) for assist with power up. Close supervision for all sit<stands with pt needing rest in between stands. Discussed benefits of having BSC to increase toilet height and also to have for safety during nighttime toileting. Pt reports that he has to "concentrate" on the Lt LE to prevent buckling in stance. He's interested in having safety plan upgraded so that he can ambulate with staff to the restroom. Advised pt to defer this question to primary OTA. Reviewed pts shower setup, pt with ~8 inch threshold with sliding door into shower. Pt intends to remove the sliding door so that he can use a TTB with curtain. Pt concerned about water spillage so discussed tucking curtain into long groove of the TTB to address. He plans to eventually have a level entry shower installed. We also talked about toileting adaptations, as he reports hygiene is difficult due to hx back issues. OT provided pt with toilet tongs and reviewed use. Pt willing to practice and see if this is a useful option for toileting at home. He is planning to remove hinges from the doors to increase widths of doorway as well, says the walker + power chair should fit easily into spaces such as bathroom. He ambulated with close supervision using RW back to his w/c. Pt remained sitting up at close of session, all needs within reach. Tx focus  placed on d/c planning, functional transfers, sit<stands, and adaptive self care skills.  Therapy Documentation Precautions:  Precautions Precautions: Fall Precaution Comments: L knee buckles Restrictions Weight Bearing Restrictions: No  ADL: ADL Eating: Not assessed Grooming: Not assessed Upper Body Bathing: Not assessed Lower Body Bathing: Not assessed Upper Body Dressing: Not assessed Lower Body Dressing: Maximal assistance Where Assessed-Lower Body Dressing: Edge of bed, Other (Comment) [name]) Toileting: Not assessed Toilet Transfer Method: Not assessed Tub/Shower Transfer: Not assessed Social research officer, government: Not assessed Therapy/Group: Individual Therapy  Kennadie Brenner A Lofton Leon 04/29/2021, 4:26 PM

## 2021-04-29 NOTE — Plan of Care (Addendum)
3 goals upgraded to reflect OT progress, please see POC for details

## 2021-04-29 NOTE — Progress Notes (Signed)
Physical Therapy Weekly Progress Note  Patient Details  Name: Tony Mcintosh MRN: [id number] Date of Birth: 1945/07/04  Beginning of progress report period: April 21, 2021 End of progress report period: April 29, 2021  Today's Date: 04/29/2021 PT Individual Time: 1300-1400 PT Individual Time Calculation (min): 60 min   Patient has met 3 of 3 short term goals.  Pt is making great progress towards therapy goals. He is able to complete bed mobility with Supervision, perform sit to stand and transfers with RW with CGA to min A, and has been able to initiate gait training and ambulate up to 80 ft with use of RW at CGA level.  Patient continues to demonstrate the following deficits muscle weakness, decreased cardiorespiratoy endurance, and decreased standing balance, decreased postural control, and decreased balance strategies and therefore will continue to benefit from skilled PT intervention to increase functional independence with mobility.  Patient progressing toward long term goals..  Continue plan of care.  PT Short Term Goals Week 1:  PT Short Term Goal 1 (Week 1): Pt will initiate gait training w/LRAD and max A PT Short Term Goal 1 - Progress (Week 1): Met PT Short Term Goal 2 (Week 1): Pt will perform sit <>stands w/LRAD and mod A PT Short Term Goal 2 - Progress (Week 1): Met PT Short Term Goal 3 (Week 1): Pt will perform bed <>chair transfers w/LRAD and max A PT Short Term Goal 3 - Progress (Week 1): Met Week 2:  PT Short Term Goal 1 (Week 2): =LTG due to ELOS  Skilled Therapeutic Interventions/Progress Updates:    Pt received seated in w/c in room, eager and agreeable to participate in therapy session. No complaints of pain this date with improvement in L knee function noted overall. Pt able to perform short distance w/c propulsion with use of BUE before onset of pain/fatigue due to RA in hands. Sit to stand with min A to RW during session. Ambulation x 77 ft, x 80 ft, x 82 ft  with RW and CGA for balance with w/c follow for safety. Pt focuses on L quad contraction during stance and exhibits improved L knee control as a result. Pt does continue to exhibit flexed trunk posture during gait but reports he is only relying on BUE 10-15%. Stand pivot transfer w/c to mat table with min A and RW. Sit to supine on flat mat table at 26.5" to simulate pt's home environment at Supervision level. Sit to supine on real bed in therapy apartment at Supervision level though increased time needed due to decreased firmness of surface. Pt returned to room and left seated in w/c with needs in reach at end of session.  Therapy Documentation Precautions:  Precautions Precautions: Fall Precaution Comments: L knee buckles Restrictions Weight Bearing Restrictions: No         Therapy/Group: Individual Therapy   Excell Seltzer, PT, DPT, CSRS  04/29/2021, 5:02 PM

## 2021-04-29 NOTE — Progress Notes (Signed)
Pt has home CPAP unit, no assistance needed.  

## 2021-04-30 LAB — COMPREHENSIVE METABOLIC PANEL
ALT: 119 U/L — ABNORMAL HIGH (ref 0–44)
AST: 65 U/L — ABNORMAL HIGH (ref 15–41)
Albumin: 3 g/dL — ABNORMAL LOW (ref 3.5–5.0)
Alkaline Phosphatase: 57 U/L (ref 38–126)
Anion gap: 7 (ref 5–15)
BUN: 16 mg/dL (ref 8–23)
CO2: 27 mmol/L (ref 22–32)
Calcium: 8.8 mg/dL — ABNORMAL LOW (ref 8.9–10.3)
Chloride: 100 mmol/L (ref 98–111)
Creatinine, Ser: 0.99 mg/dL (ref 0.61–1.24)
GFR, Estimated: >60 mL/min (ref >60)
Glucose, Bld: 99 mg/dL (ref 70–99)
Potassium: 3.9 mmol/L (ref 3.5–5.1)
Sodium: 134 mmol/L — ABNORMAL LOW (ref 135–145)
Total Bilirubin: 0.5 mg/dL (ref 0.3–1.2)
Total Protein: 8 g/dL (ref 6.5–8.1)

## 2021-04-30 LAB — CBC WITH DIFFERENTIAL/PLATELET
Abs Immature Granulocytes: 0.04 10*3/uL (ref 0.00–0.07)
Basophils Absolute: 0.1 10*3/uL (ref 0.0–0.1)
Basophils Relative: 1 %
Eosinophils Absolute: 0.1 10*3/uL (ref 0.0–0.5)
Eosinophils Relative: 3 %
HCT: 39.9 % (ref 39.0–52.0)
Hemoglobin: 13.4 g/dL (ref 13.0–17.0)
Immature Granulocytes: 1 %
Lymphocytes Relative: 42 %
Lymphs Abs: 1.8 10*3/uL (ref 0.7–4.0)
MCH: 31.3 pg (ref 26.0–34.0)
MCHC: 33.6 g/dL (ref 30.0–36.0)
MCV: 93.2 fL (ref 80.0–100.0)
Monocytes Absolute: 0.6 10*3/uL (ref 0.1–1.0)
Monocytes Relative: 14 %
Neutro Abs: 1.7 10*3/uL (ref 1.7–7.7)
Neutrophils Relative %: 39 %
Platelets: 240 10*3/uL (ref 150–400)
RBC: 4.28 MIL/uL (ref 4.22–5.81)
RDW: 13.5 % (ref 11.5–15.5)
WBC: 4.2 10*3/uL (ref 4.0–10.5)
nRBC: 0 % (ref 0.0–0.2)

## 2021-04-30 MED ORDER — TRAMADOL HCL 50 MG PO TABS
50.0000 mg | ORAL_TABLET | Freq: Two times a day (BID) | ORAL | Status: DC | PRN
  Administered 2021-05-01 – 2021-05-02 (×2): 50 mg via ORAL
  Filled 2021-04-30 (×2): qty 1

## 2021-04-30 NOTE — Progress Notes (Signed)
Physical Therapy Session Note  Patient Details  Name: Tony Mcintosh MRN: [id number] Date of Birth: 02/12/1946  Today's Date: 04/30/2021 PT Individual Time: 1000-1100; 1415-1455 PT Individual Time Calculation (min): 60 min and 40 min  Short Term Goals: Week 2:  PT Short Term Goal 1 (Week 2): =LTG due to ELOS  Skilled Therapeutic Interventions/Progress Updates:    Session 1: Pt received seated in w/c in room, agreeable to PT session. Pt reports he had some soreness in his back overnight, no pain currently. Sit to stand with CGA to RW during session. Ambulation x 83 ft, 98 ft, x 140 ft with RW and CGA for balance. Pt exhibits flexed head and ongoing heavy UE reliance during gait, focuses on decreasing UE support on RW and L quad activation in stance. Pt continues to exhibit improved endurance for gait training. Standing mini-squats x 10 reps, 2 x 15 reps to fatigue with RW and CGA for balance for quad strengthening. Pt exhibits impaired ability to activate quads simultaneously due to delay on L side. Pt left seated in w/c in room with needs in reach at end of session.  Session 2: Pt received seated in w/c in room, agreeable to PT session. Pt reports increase in L knee pain/soreness/stiffness this PM. Pt to request pain medication at end of session, utilized stretching and ice pack at end of session for pain management. Trained pt's son Thayer Ohm on how to safely assist pt with sit to stand transfers to RW and short distance ambulation to bathroom with RW and CGA. Pt's son able to perform return demo of assisting pt with transfers. Stand pivot transfer to Nustep with RW and CGA. Nustep level 3 x 5 min with use of B UE/LE with focus on increasing L knee ROM due to stiffness. Pt able to increase L knee ROM with assist from Nustep, does have increase in soreness following stretching. Pt also requires cues to prevent L hip ER while on Nustep. Pt returned to room and left seated in w/c with needs in reach,  son present, ice pack to L knee.  Therapy Documentation Precautions:  Precautions Precautions: Fall Precaution Comments: L knee buckles Restrictions Weight Bearing Restrictions: No     Therapy/Group: Individual Therapy   Peter Congo, PT, DPT, CSRS  04/30/2021, 12:08 PM

## 2021-04-30 NOTE — Progress Notes (Signed)
Patient ID: Tony Mcintosh, male   DOB: 01/01/1946, 76 y.o.   MRN: [id number]  SW met with pt and pt son [name] in room to discuss HHA preference. Pt called his wife while SW in room. 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   SW called [facility] at [place] location/ #[phone]) to discuss referral. Reports all referrals must come from [place] office first.   SW sent request for services form with clinicals, and pictures to pt [place] ([email or web address]).   Loralee Pacas, MSW, [place] Office: [phone] Cell: [phone] Fax: [phone]

## 2021-04-30 NOTE — Progress Notes (Signed)
Occupational Therapy Session Note  Patient Details  Name: Tony Mcintosh MRN: [id number] Date of Birth: [birth year]  Today's Date: 04/30/2021 OT Individual Time: 0705-0830 OT Individual Time Calculation (min): 85 min    Short Term Goals: Week 2:  OT Short Term Goal 1 (Week 2): Pt will complete toileting tasks with mod A OT Short Term Goal 2 (Week 2): Pt will complete bathing at shower level with min A OT Short Term Goal 3 (Week 2): Pt will maintain standing balance with CGA when pulling pants over hips  Skilled Therapeutic Interventions/Progress Updates:    Pt seated in w/c upon arrival. OT intervention with focus on bathing at shower level, dressing with sit<>stand from w/c, standing balance at sink, functional amb with RW in room, TTB transfers, ongoing discharge planning, activity tolerance, and safety awareness to increase independence with BADLs. Amb with RW in room and hallway with CGA. Bathing with CGA sit<>stand in shower. Pt also practiced sit<>stand from toilet in bathroom with CGA. Dressing with CGA for standing. Pt uses AE to assist. TTB tranfsers with CGA. Pt remained seated in w/c with all needs within reach.  Therapy Documentation Precautions:  Precautions Precautions: Fall Precaution Comments: L knee buckles Restrictions Weight Bearing Restrictions: No   Pain:  Pt c/o some "stiffness" in back when initially standing, resolved with activity   Therapy/Group: Individual Therapy  Rich Brave 04/30/2021, 9:14 AM

## 2021-04-30 NOTE — Progress Notes (Signed)
PROGRESS NOTE   Subjective/Complaints:  Tony Mcintosh reports coldness of feet is doing better- wondering why since been cold for "years" with neuropathy.   Didn't get therapy Saturday so very stiff Sunday-  Back also hurting really bad last 5-6 days- has chronic back pain- takes Norco at home sometimes- doesn't really want opiate.    ROS:  Tony Mcintosh denies SOB, abd pain, CP, N/V/C/D, and vision changes    Objective:   No results found. Recent Labs    04/30/21 0548  WBC 4.2  HGB 13.4  HCT 39.9  PLT 240    Recent Labs    04/30/21 0548  NA 134*  K 3.9  CL 100  CO2 27  GLUCOSE 99  BUN 16  CREATININE 0.99  CALCIUM 8.8*     Intake/Output Summary (Last 24 hours) at 04/30/2021 0846 Last data filed at 04/30/2021 0410 Gross per 24 hour  Intake 480 ml  Output 600 ml  Net -120 ml        Physical Exam: Vital Signs Blood pressure (!) 142/71, pulse 67, temperature 97.9 F (36.6 C), temperature source Oral, resp. rate 18, height  (1.803 m), weight (!) 138.3 kg, SpO2 93 %.        General: awake, alert, appropriate, BMI 42.5; in shower with OTA in bathroom; NAD HENT: conjugate gaze; oropharynx moist CV: regular rate; no JVD Pulmonary: CTA B/L; no W/R/R- good air movement GI: soft, NT, ND, (+)BS Psychiatric: appropriate Neurological: Ox3  Psych: pleasant and cooperative  Musculoskeletal: no muscle spasms seen this AM.= hands less swollen/feet cold kpad on feet. Rubbing back constantly. - low back    Cervical back: Rigidity present. Right knee swollen,  5/5 strength with the exception of LLE 4/5- greatly improved as knee swelling is notable- moderate esp suprapatellar, however nothing to compare it to. Left leg with less muscle girth than right side Neurological:     Mental Status: He is alert and oriented to person, place, and time.  Skin/ext::right foot with a little more blood pooling than left. Both feet warm to  touch with norma cap refill   Assessment/Plan: 1. Functional deficits which require 3+ hours per day of interdisciplinary therapy in a comprehensive inpatient rehab setting. Physiatrist is providing close team supervision and 24 hour management of active medical problems listed below. Physiatrist and rehab team continue to assess barriers to discharge/monitor patient progress toward functional and medical goals  Care Tool:  Bathing  Bathing activity did not occur: Refused Body parts bathed by patient: Right arm, Left arm, Chest, Abdomen, Front perineal area, Right upper leg, Left upper leg, Right lower leg, Face, Left lower leg   Body parts bathed by helper: Buttocks     Bathing assist Assist Level: Minimal Assistance - Patient > 75%     Upper Body Dressing/Undressing Upper body dressing Upper body dressing/undressing activity did not occur (including orthotics): Refused (Tony Mcintosh reported he had just changed shirt prior to evaluation) What is the patient wearing?: Pull over shirt    Upper body assist Assist Level: Set up assist    Lower Body Dressing/Undressing Lower body dressing      What is the patient wearing?: Pants,  Underwear/pull up     Lower body assist Assist for lower body dressing: Contact Guard/Touching assist     Toileting Toileting Toileting Activity did not occur Press photographer(Clothing management and hygiene only): Refused  Toileting assist Assist for toileting: Dependent - Patient 0%     Transfers Chair/bed transfer  Transfers assist  Chair/bed transfer activity did not occur: Refused  Chair/bed transfer assist level: Minimal Assistance - Patient > 75%     Locomotion Ambulation   Ambulation assist   Ambulation activity did not occur: Safety/medical concerns (Fatigue, pain)  Assist level: Contact Guard/Touching assist Assistive device: Walker-rolling Max distance: 3282'   Walk 10 feet activity   Assist  Walk 10 feet activity did not occur: Safety/medical  concerns (Fatigue, pain)  Assist level: Contact Guard/Touching assist Assistive device: Walker-rolling   Walk 50 feet activity   Assist Walk 50 feet with 2 turns activity did not occur: Safety/medical concerns (Fatigue, pain)  Assist level: Contact Guard/Touching assist Assistive device: Walker-rolling    Walk 150 feet activity   Assist Walk 150 feet activity did not occur: Safety/medical concerns         Walk 10 feet on uneven surface  activity   Assist Walk 10 feet on uneven surfaces activity did not occur: Safety/medical concerns (Fatigue, pain)         Wheelchair     Assist Is the patient using a wheelchair?: Yes Type of Wheelchair: Manual    Wheelchair assist level: Dependent - Patient 0%      Wheelchair 50 feet with 2 turns activity    Assist        Assist Level: Dependent - Patient 0%   Wheelchair 150 feet activity     Assist      Assist Level: Dependent - Patient 0%   Blood pressure (!) 142/71, pulse 67, temperature 97.9 F (36.6 C), temperature source Oral, resp. rate 18, height 5\' 11"  (1.803 m), weight (!) 138.3 kg, SpO2 93 %.    Medical Problem List and Plan: 1. 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 IVIG x 5 days completed.             -patient may shower             -ELOS/Goals: minA 14-18 days           - D/c 05/09/21  -Continue CIR- Tony Mcintosh, OT -  2.  Antithrombotics: -DVT/anticoagulation:  Pharmaceutical: Lovenox             -antiplatelet therapy: none 3. Left thumb pain: Ice 15 minutes TID. Tylenol. Spreading to right hand as well- appears to be his rheumatoid arthritis. Added voltaren gel. Increase prednisone to 5mg . Consider consulting his rheumatologist regarding potential treatment options  1/23- L/m for Tony Mcintosh's Rheum about his prednisone/MTX- refused MTX last week when ordered, of note. 1/24-  changed MTX to start today 17.5 mg qweek; got OK from Rheum; also increased Prednisone to his home dose of 10 mg daily.   1/28- pain under reasonable control at present 1/30- will add tramadol 50 mg BID prn for back pain- has chronic pain AND bed is really hurting his back.  4. Mood: LCSW to evlauate and provide emotional support             -antipsychotic agents: n/a 5. Neuropsych: This patient is capable of making decisions on his own behalf. 6. Skin/Wound Care: Routine skin checks 7.  Fluids/Electrolytes/Nutrition: Routine ins and outs and follow-up chemistries 8.  Peripheral polyneuropathy: Continue gabapentin 1/30- explained this is why had col feeling of feet- but wondering how GBS plays a role in changing sensation as well.  9.  Obstructive sleep apnea: Continue CPAP at night time and while napping 10: Morbid obesity: Discussed benefits of Mediterranean diet   1/24- BMI 41.82- con't reduction in calories 11: Restless leg syndrome: Continue Mirapex  1/24- will change to 2pm and 7pm per his home dose  1/27- working much better- con't regimen 12: Benign prostatic hypertrophy: Continue Flomax 13: Muscle spasm: Robaxin prn (was getting Valium on acute side)  1/23- increased robaxin to 2x/day at 3pm and 9pm per Tony Mcintosh request and BID prn increased to 1000 mg- max 4x/day.  1/28- Muscle spasms controlled- con't regimen 14: Vitamin B12 deficiency: continue oral supplement  15: RA- Arthritis flare both hands: started on prednisone then weaned off; methotrexate home med held  1/24- restarted MTX q week today and increased prednisone to home dose 10 mg daily.   1/27- doing much better overall- still Am stiffness 16: Overactive bladder: continue Vibegron 17: Loose stools versus constipation: continue to monitor>>Imodium PRN/anti-constipation measures. Added prn Dulxolax.  18: History of Vitamin D deficiency: check serum level  1/24- Vit D level 44.45 19: Mild thrombocytopenia: follow-up CBC  1/23-  plts down slightly to 136k- will recheck in AM 20. Left knee pain: ice 15 minutes TID. Messaged ortho PA to see if he can get knee aspiration again but pain has improved- can likely defer to later in the week, discussed with ortho PA.   1/23- has already d/w ortho- they don't want to take more fluid off L knee right now-   1/28 pain appears improved-  21. Low back pain, chronic: add kpad. 22. Cold feet/neuropathy  1/28 discussed with patient that circulation appeared to be adequate in both feet. "Cold" sensation is more related to his neuropathy.Mild color difference might be related to neurological factors, positioning, etc.   22. Leukopenia  1/27- Down to 3.7- not sure cause- just under normal- will recheck Monday and could be due to prednisone?  1/30- back to 4.2- will monitor weekly.     LOS: 10 days A FACE TO FACE EVALUATION WAS PERFORMED  Jevaun Strick 04/30/2021, 8:46 AM

## 2021-04-30 NOTE — Plan of Care (Signed)
°  Problem: RH Balance Goal: LTG Patient will maintain dynamic standing balance (PT) Description: LTG:  Patient will maintain dynamic standing balance with assistance during mobility activities (PT) Flowsheets (Taken 04/30/2021 0746) LTG: Pt will maintain dynamic standing balance during mobility activities with:: (upgrade due to progress) Supervision/Verbal cueing Note: Upgrade due to progress   Problem: Sit to Stand Goal: LTG:  Patient will perform sit to stand with assistance level (PT) Description: LTG:  Patient will perform sit to stand with assistance level (PT) Flowsheets (Taken 04/30/2021 0746) LTG: PT will perform sit to stand in preparation for functional mobility with assistance level: (upgrade due to progress) Supervision/Verbal cueing Note: Upgrade due to progress   Problem: RH Bed to Chair Transfers Goal: LTG Patient will perform bed/chair transfers w/assist (PT) Description: LTG: Patient will perform bed to chair transfers with assistance (PT). Flowsheets (Taken 04/30/2021 0746) LTG: Pt will perform Bed to Chair Transfers with assistance level: (upgrade due to progress) Supervision/Verbal cueing Note: Upgrade due to progress   Problem: RH Ambulation Goal: LTG Patient will ambulate in home environment (PT) Description: LTG: Patient will ambulate in home environment, # of feet with assistance (PT). Flowsheets (Taken 04/30/2021 0746) LTG: Pt will ambulate in home environ  assist needed:: (upgrade due to progress) -- LTG: Ambulation distance in home environment: (upgrade due to progress) 75 ft   Problem: RH Ambulation Goal: LTG Patient will ambulate in controlled environment (PT) Description: LTG: Patient will ambulate in a controlled environment, # of feet with assistance (PT). Flowsheets (Taken 04/30/2021 0749) LTG: Pt will ambulate in controlled environ  assist needed:: (added goal due to progress) Supervision/Verbal cueing LTG: Ambulation distance in controlled environment:  150 ft with LRAD Note: Added goal due to progress

## 2021-04-30 NOTE — Plan of Care (Signed)
°  Problem: RH Balance Goal: LTG Patient will maintain dynamic standing balance (PT) Description: LTG:  Patient will maintain dynamic standing balance with assistance during mobility activities (PT) Flowsheets (Taken 04/30/2021 0746) LTG: Pt will maintain dynamic standing balance during mobility activities with:: (upgrade due to progress) Supervision/Verbal cueing Note: Upgrade due to progress   Problem: Sit to Stand Goal: LTG:  Patient will perform sit to stand with assistance level (PT) Description: LTG:  Patient will perform sit to stand with assistance level (PT) Flowsheets (Taken 04/30/2021 0746) LTG: PT will perform sit to stand in preparation for functional mobility with assistance level: (upgrade due to progress) Supervision/Verbal cueing Note: Upgrade due to progress   Problem: RH Bed to Chair Transfers Goal: LTG Patient will perform bed/chair transfers w/assist (PT) Description: LTG: Patient will perform bed to chair transfers with assistance (PT). Flowsheets (Taken 04/30/2021 0746) LTG: Pt will perform Bed to Chair Transfers with assistance level: (upgrade due to progress) Supervision/Verbal cueing Note: Upgrade due to progress   Problem: RH Ambulation Goal: LTG Patient will ambulate in home environment (PT) Description: LTG: Patient will ambulate in home environment, # of feet with assistance (PT). Flowsheets (Taken 04/30/2021 0746) LTG: Pt will ambulate in home environ  assist needed:: (upgrade due to progress) -- LTG: Ambulation distance in home environment: (upgrade due to progress) 75 ft

## 2021-04-30 NOTE — Progress Notes (Signed)
Placed patient on home CPAP unit for the night.  

## 2021-05-01 NOTE — Progress Notes (Addendum)
Occupational Therapy Session Note  Patient Details  Name: BARBARA CALCANO MRN: [id number] Date of Birth: 1946/03/25  Today's Date: 05/01/2021 OT Individual Time: 0700-0825 OT Individual Time Calculation (min): 85 min    Short Term Goals: Week 2:  OT Short Term Goal 1 (Week 2): Pt will complete toileting tasks with mod A OT Short Term Goal 2 (Week 2): Pt will complete bathing at shower level with min A OT Short Term Goal 3 (Week 2): Pt will maintain standing balance with CGA when pulling pants over hips  Skilled Therapeutic Interventions/Progress Updates:    OT intervention with focus on sit<>stand, standing balance, functional amb with RW, discharge planning, and activity tolerance to increase independence with BADLs. Pt stood at sink to complete grooming tasks. Pt amb with RW to bathroom to practice sit<>stand from toilet and bariatric BSC. Sit<>stand with CGA. Pt will require bariatric BSC at discharge secondary to body habitus. Standing balance tossing ball and .5kg ball against rebounder 3x10 each with CGA. Standing balance at [facility] with CGA. Amb with RW 39' with CGA. Pt returned to room and remained in w/c with all needs within reach.  Therapy Documentation Precautions:  Precautions Precautions: Fall Precaution Comments: L knee buckles Restrictions Weight Bearing Restrictions: No   Pain:  Pt reports that his Lt knee is "feeling stiff"; activity with improvement noted    Therapy/Group: Individual Therapy  Rich Brave 05/01/2021, 8:32 AM

## 2021-05-01 NOTE — Discharge Instructions (Addendum)
COMMUNITY REFERRALS UPON DISCHARGE:    Home Health:   PT     OT     SNA                    Agency: [facility]  Phone: [phone] *Please expect follow-up within 2-3 days to schedule your home visit. If you have not received follow-up, be sure to contact the branch directly.*   Medical Equipment/Items Ordered:bariatric 3in1 BSC, bariatric rolling walker, and tub transfer bench                                                  [facility]  GENERAL COMMUNITY RESOURCES FOR PATIENT/FAMILY: Reminder of the follow-[facility] upcoming appointments with [facility]:  05/14/2021 09:30 [facility] OT 1 --- *  this is the OP OT video call that is for our OT to speak about home modifications (called a HISA grant process)  07/20/2021 09:30 SAM/NEURO/MD PROV D  09/20/2021 09:30 SAM SLEEP MD DO C  01/28/2022 08:30 [facility] LAB NONC  02/07/2022 10:00 [facility] PACT 2 NP  03/05/2022 09:30 [facility] 1 Inpatient Rehab Discharge Instructions  Tony Mcintosh Discharge date and time: 05/09/2021  Activities/Precautions/ Functional Status: Activity: no lifting, driving, or strenuous exercise for until cleared by MD Diet: regular diet Wound Care: none needed Functional status:  ___ No restrictions     ___ Walk up steps independently ___ 24/7 supervision/assistance   ___ Walk up steps with assistance ___ Intermittent supervision/assistance  ___ Bathe/dress independently ___ Walk with walker     ___ Bathe/dress with assistance ___ Walk Independently    ___ Shower independently ___ Walk with assistance    __x_ Shower with assistance _x__ No alcohol     ___ Return to work/school ________  Special Instructions:  No driving, alcohol consumption or tobacco use.   My questions have been answered and I understand these instructions. I will adhere to these goals and the provided educational materials after my discharge from the hospital.  Patient/Caregiver Signature  _______________________________ Date __________  Clinician Signature _______________________________________ Date __________  Please bring this form and your medication list with you to all your follow-up doctor's appointments.

## 2021-05-01 NOTE — Progress Notes (Signed)
PROGRESS NOTE   Subjective/Complaints:  Pt reports hasn't slept well in 2 days- only ~ 3 hours at a time.  Sleeping on side now, though, which is better.  Didn't remember to ask for tramadol, so don't know if helped.  L knee stiff/swollen again this AM- iced it yesterday.   Per therapy, upgraded his goals.  Asking why needs Lovenox-   ROS:   Pt denies SOB, abd pain, CP, N/V/C/D, and vision changes   Objective:   No results found. Recent Labs    04/30/21 0548  WBC 4.2  HGB 13.4  HCT 39.9  PLT 240    Recent Labs    04/30/21 0548  NA 134*  K 3.9  CL 100  CO2 27  GLUCOSE 99  BUN 16  CREATININE 0.99  CALCIUM 8.8*     Intake/Output Summary (Last 24 hours) at 05/01/2021 0827 Last data filed at 04/30/2021 1805 Gross per 24 hour  Intake 476 ml  Output --  Net 476 ml        Physical Exam: Vital Signs Blood pressure 134/83, pulse 60, temperature 97.8 F (36.6 C), temperature source Oral, resp. rate 18, height  (1.803 m), weight (!) 138.3 kg, SpO2 96 %.          General: awake, alert, appropriate, sitting up in w/c in gym with OTA; BMI 42; NAD HENT: conjugate gaze; oropharynx moist CV: regular/borderline bradycardic  rate; no JVD Pulmonary: CTA B/L; no W/R/R- good air movement GI: soft, NT, ND, (+)BS- protuberant;  Psychiatric: appropriate; interactive Neurological: Ox3 Psych: pleasant and cooperative  Musculoskeletal: L knee still slightly swollen, but better     Cervical back: Rigidity present. 5/5 strength with the exception of LLE 4/5- greatly improved as knee swelling is notable- moderate esp suprapatellar, however nothing to compare it to. Left leg with less muscle girth than right side Neurological:     Mental Status: He is alert and oriented to person, place, and time.  Skin/ext::right foot with a little more blood pooling than left. Both feet warm to touch with norma cap  refill   Assessment/Plan: 1. Functional deficits which require 3+ hours per day of interdisciplinary therapy in a comprehensive inpatient rehab setting. Physiatrist is providing close team supervision and 24 hour management of active medical problems listed below. Physiatrist and rehab team continue to assess barriers to discharge/monitor patient progress toward functional and medical goals  Care Tool:  Bathing  Bathing activity did not occur: Refused Body parts bathed by patient: Right arm, Left arm, Chest, Abdomen, Front perineal area, Right upper leg, Left upper leg, Right lower leg, Face, Left lower leg, Buttocks   Body parts bathed by helper: Buttocks     Bathing assist Assist Level: Contact Guard/Touching assist     Upper Body Dressing/Undressing Upper body dressing Upper body dressing/undressing activity did not occur (including orthotics): Refused (pt reported he had just changed shirt prior to evaluation) What is the patient wearing?: Pull over shirt    Upper body assist Assist Level: Set up assist    Lower Body Dressing/Undressing Lower body dressing      What is the patient wearing?: Pants, Underwear/pull up  Lower body assist Assist for lower body dressing: Contact Guard/Touching assist     Toileting Toileting Toileting Activity did not occur (Clothing management and hygiene only): Refused  Toileting assist Assist for toileting: Dependent - Patient 0%     Transfers Chair/bed transfer  Transfers assist  Chair/bed transfer activity did not occur: Refused  Chair/bed transfer assist level: Contact Guard/Touching assist     Locomotion Ambulation   Ambulation assist   Ambulation activity did not occur: Safety/medical concerns (Fatigue, pain)  Assist level: Contact Guard/Touching assist Assistive device: Walker-rolling Max distance: 140'   Walk 10 feet activity   Assist  Walk 10 feet activity did not occur: Safety/medical concerns (Fatigue,  pain)  Assist level: Contact Guard/Touching assist Assistive device: Walker-rolling   Walk 50 feet activity   Assist Walk 50 feet with 2 turns activity did not occur: Safety/medical concerns (Fatigue, pain)  Assist level: Contact Guard/Touching assist Assistive device: Walker-rolling    Walk 150 feet activity   Assist Walk 150 feet activity did not occur: Safety/medical concerns         Walk 10 feet on uneven surface  activity   Assist Walk 10 feet on uneven surfaces activity did not occur: Safety/medical concerns (Fatigue, pain)         Wheelchair     Assist Is the patient using a wheelchair?: Yes Type of Wheelchair: Manual    Wheelchair assist level: Dependent - Patient 0%      Wheelchair 50 feet with 2 turns activity    Assist        Assist Level: Dependent - Patient 0%   Wheelchair 150 feet activity     Assist      Assist Level: Dependent - Patient 0%   Blood pressure 134/83, pulse 60, temperature 97.8 F (36.6 C), temperature source Oral, resp. rate 18, height  (1.803 m), weight (!) 138.3 kg, SpO2 96 %.    Medical Problem List and Plan: 1. 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 IVIG x 5 days completed.             -patient may shower             -ELOS/Goals: minA 14-18 days           - D/c 05/09/21  -Continue CIR- PT, OT - con't CIR- PT and OT- team conference today to update on progress- goals upgraded.  Thrombotics: -DVT/anticoagulation:  Pharmaceutical: Lovenox             -antiplatelet therapy: none 3. Left thumb pain: Ice 15 minutes TID. Tylenol. Spreading to right hand as well- appears to be his rheumatoid arthritis. Added voltaren gel. Increase prednisone to . Consider consulting his rheumatologist regarding potential treatment options  1/23- L/m for pt's Rheum about his prednisone/MTX-  refused MTX last week when ordered, of note. 1/24- changed MTX to start today 17.5 mg qweek; got OK from Rheum; also increased Prednisone to his home dose of 10 mg daily.   1/28- pain under reasonable control at present 1/30- will add tramadol 50 mg BID prn for back pain- has chronic pain AND bed is really hurting his back. 1/31- forgot to ask for tramadol- con't regimen and monitor- asked him to just ask for pain meds prn.   4. Mood: LCSW to evlauate and provide emotional support             -antipsychotic  agents: n/a 5. Neuropsych: This patient is capable of making decisions on his own behalf. 6. Skin/Wound Care: Routine skin checks 7. Fluids/Electrolytes/Nutrition: Routine ins and outs and follow-up chemistries 8.  Peripheral polyneuropathy: Continue gabapentin 1/30- explained this is why had col feeling of feet- but wondering how GBS plays a role in changing sensation as well.  9.  Obstructive sleep apnea: Continue CPAP at night time and while napping 10: Morbid obesity: Discussed benefits of Mediterranean diet   1/24- BMI 41.82- con't reduction in calories  1/31- BMI up to 42.52- is stable to slightly up.  11: Restless leg syndrome: Continue Mirapex  1/24- will change to 2pm and 7pm per his home dose  1/27- working much better- con't regimen 12: Benign prostatic hypertrophy: Continue Flomax 13: Muscle spasm: Robaxin prn (was getting Valium on acute side)  1/23- increased robaxin to 2x/day at 3pm and 9pm per pt request and BID prn increased to 1000 mg- max 4x/day.  1/28- Muscle spasms controlled- con't regimen 14: Vitamin B12 deficiency: continue oral supplement  15: RA- Arthritis flare both hands: started on prednisone then weaned off; methotrexate home med held  1/24- restarted MTX q week today and increased prednisone to home dose 10 mg daily.   1/31- still having AM stiffness, but better. Con't regimen 16: Overactive bladder: continue Vibegron 17: Loose stools versus constipation:  continue to monitor>>Imodium PRN/anti-constipation measures. Added prn Dulxolax.  18: History of Vitamin D deficiency: check serum level  1/24- Vit D level 44.45 19: Mild thrombocytopenia: follow-up CBC  1/23- plts down slightly to 136k- will recheck in AM  1/31- Plts up to 240k- con't to monitor 20. Left knee pain: ice 15 minutes TID. Messaged ortho PA to see if he can get knee aspiration again but pain has improved- can likely defer to later in the week, discussed with ortho PA.   1/23- has already d/w ortho- they don't want to take more fluid off L knee right now-   1/28 pain appears improved-  21. Low back pain, chronic: add kpad. 22. Cold feet/neuropathy  1/28 discussed with patient that circulation appeared to be adequate in both feet. "Cold" sensation is more related to his neuropathy.Mild color difference might be related to neurological factors, positioning, etc.   22. Leukopenia  1/27- Down to 3.7- not sure cause- just under normal- will recheck Monday and could be due to prednisone?  1/30- back to 4.2- will monitor weekly.    I spent a total of  35  minutes on total care today- >50% coordination of care- due to team conference and d/w OTA about goals.     LOS: 11 days A FACE TO FACE EVALUATION WAS PERFORMED  Shamiya Demeritt 05/01/2021, 8:27 AM

## 2021-05-01 NOTE — Progress Notes (Signed)
Patient ID: PILAR CORRALES, male   DOB: 1945-05-15, 75 y.o.   MRN: [id number]  SW met with pt and pt son to provide updates from team conference, and all [place] request sent to [place] shared he received a phone call from [place] reporting he has a video conference on 2/13 to discuss home modifications. SW discussed [facility]. SW requested update no later than Monday to cancel transportation request. Pt aware SW will follow-up with his wife.   SW received updates from [place] that request for transportation at 10am was requested and waiting for it be approved. Also reports that DME: bariatric RW and TTB. Reports 3in1 BSC is on back order and possibly in stock by Friday.   1415-SW spoke with pt wife Tye [place] [phone]) to provide above updates. Wife is reporting pt has confirmed that their son will be transporting. SW will confirm with patient.   Loralee Pacas, MSW, [place] Office: [phone] Cell: [phone] Fax: [phone]

## 2021-05-01 NOTE — Progress Notes (Signed)
Physical Therapy Session Note  Patient Details  Name: Tony Mcintosh MRN: [id number] Date of Birth: [birth year]  Today's Date: 05/01/2021 PT Individual Time: 0900-0950; 1400-1500 PT Individual Time Calculation (min): 50 min and 60 min PT Amount of Missed Time (min): 10 Minutes PT Missed Treatment Reason: Patient fatigue  Short Term Goals: Week 2:  PT Short Term Goal 1 (Week 2): =LTG due to ELOS  Skilled Therapeutic Interventions/Progress Updates:    Session 1: Pt received seated in w/c in room, agreeable to PT session. Pt reports increase in L knee pain/stiffness this AM. Pt reports minimal relief with use of ice pack last session for pain management. Pt has Voltarin gel applied to L knee for pain management at beginning of session. Sit to stand with CGA to RW during session. Ambulation x 160 ft, x 155 ft, x 168 ft with use of RW and CGA. Pt continues to exhibit improved endurance for gait training, no increase in pain during gait. Pt does exhibit increase in UE reliance with onset of fatigue. Discussed pt's home setup and accessibility into his woodshop. Discussed safe seating arrangements in woodshop (not recommending stools or chairs with wheels) and appropriate height for seating and workbench. Pt fatigued following gait this date, requests to return to room. Pt missed 10 min of scheduled therapy session due to fatigue. Pt left seated in w/c in room with needs in reach at end of session.  Session 2: Pt received seated in w/c in room, agreeable to PT session. Pt reports improvement in L knee pain this PM, does request pain medication at end of session and plans to use kpad later for pain management as needed. Sit to stand and stand pivot transfer with RW and CGA during session. Static standing balance with no UE support performing ball toss, 3 x 30 reps to fatigue with focus on upright posture and maintaining balance. Sit to stand 3 x 5 reps from mat table with no AD and CGA, focus on LE  activation to assist with transfer. Ambulation x 233 ft with RW and CGA at end of session with 2 standing rest breaks needed due to fatigue, increase in UE reliance on RW and trunk flexion with onset of fatigue. Pt left seated in w/c in room with needs in reach at end of session.  Therapy Documentation Precautions:  Precautions Precautions: Fall Precaution Comments: L knee buckles Restrictions Weight Bearing Restrictions: No General: PT Amount of Missed Time (min): 10 Minutes PT Missed Treatment Reason: Patient fatigue     Therapy/Group: Individual Therapy   Peter Congo, PT, DPT, CSRS  05/01/2021, 12:05 PM

## 2021-05-01 NOTE — Patient Care Conference (Signed)
Inpatient RehabilitationTeam Conference and Plan of Care Update Date: 05/01/2021   Time: 11:06 AM    Patient Name: Tony Mcintosh      Medical Record Number: [id number]  Date of Birth: Mar 18, 1946 Sex: Male         Room/Bed: 4M04C/4M04C-01 Payor Info: Payor: MEDICARE / Plan: MEDICARE PART A AND B / Product Type: *No Product type* /    Admit Date/Time:  04/20/2021  2:47 PM  Primary Diagnosis:  GBS (Guillain-Barre syndrome) [facility])  Hospital Problems: Principal Problem:   GBS (Guillain-Barre syndrome) [facility])    Expected Discharge Date: Expected Discharge Date: 05/09/21  Team Members Present: Physician leading conference: Dr. Genice Rouge Social Worker Present: Cecile Sheerer, LCSWA Nurse Present: Kennyth Arnold, RN PT Present: Peter Congo, PT OT Present: Ardis Rowan, COTA;Jennifer Katrinka Blazing, OT PPS Coordinator present : Fae Pippin, SLP     Current Status/Progress Goal Weekly Team Focus  Bowel/Bladder   Pt is cont of B/B. LBM 04/30/21  remain cont. of B/B  Assess q shift & PRN   Swallow/Nutrition/ Hydration             ADL's   bathing-CGA for LB; LB dressing-CGA; toileting-mod A; tranfsers-CGA  upgraded to supervision overall  standing balance, BADLs, activity tolerance, education   Mobility   Supervision bed mobility, CGA transfers with RW, CGA gait up to 140 ft with RW  upgraded all goals to Supervision and increased gait goal distance  LE NMR and strengthening, gait training, balance   Communication             Safety/Cognition/ Behavioral Observations            Pain   No c/o pain on this shift, nurse reported on shift pain that patient had pain on earlier shift.  decrease muscle spasms and pain score <3  Assess pain qshift & PRN   Skin   Pt skin is dry and intact no skin impairments  Remain free from skin impairments  Assess skin q shift & PRN     Discharge Planning:  Pt will d/c to home with his wife who is the primary caregiver and his wife is unable to  provide physical assistsance. Request for HHPT/OT/Aide and DME: bariatric RW/TTB/3in1 BSC [facility]. Pt will also require transportation which was listed on request as well.   Team Discussion: Increased steroids for RA. Added Tramadol for back pain. LFT's elevated, will recheck Thursday. Will add medication for sleep. [facility] providing [facility] at [facility]. Complains of left knee pain but not limiting. Patient on target to meet rehab goals: yes, upgraded to supervision goals. Has good awareness.  *See Care Plan and progress notes for long and short-term goals.   Revisions to Treatment Plan:  Adjusting medications and upgraded goals.   Teaching Needs: Family education, medication/pain management, transfer/gait training, safety awareness, etc.  Current Barriers to Discharge: LFT's and sleep  Possible Resolutions to Barriers: Recheck labs Add sleep medication     Medical Summary Current Status: AIDP- continent B/B- back and joint pain- LFT elevation- skin look good- poor sleep  Barriers to Discharge: Decreased family/caregiver support;Home enviroment access/layout;Weight;Medical stability;Weight bearing restrictions  Barriers to Discharge Comments: going home via [place]- upgraded goals by therapy- Possible Resolutions to [employer] Focus: focus on LFT elevation- rechecking CMP tomorrow- L knee pain- increased Prednisone/on MTX_ MTX likely cause of LFTs- will see what can do/labs show- increase trzodone fo rsleep- great therapy progress- d/c 05/09/21   Continued Need for Acute  Rehabilitation Level of Care: The patient requires daily medical management by a physician with specialized training in physical medicine and rehabilitation for the following reasons: Direction of a multidisciplinary physical rehabilitation program to maximize functional independence : Yes Medical management of patient stability for increased activity during participation in an intensive  rehabilitation regime.: Yes Analysis of laboratory values and/or radiology reports with any subsequent need for medication adjustment and/or medical intervention. : Yes   I attest that I was present, lead the team conference, and concur with the assessment and plan of the team.   Tennis MustJENNINGS, Lunetta Marina G 05/01/2021, 4:32 PM

## 2021-05-02 ENCOUNTER — Inpatient Hospital Stay (HOSPITAL_COMMUNITY): Payer: No Typology Code available for payment source

## 2021-05-02 LAB — CBC WITH DIFFERENTIAL/PLATELET
Abs Immature Granulocytes: 0.04 10*3/uL (ref 0.00–0.07)
Basophils Absolute: 0 10*3/uL (ref 0.0–0.1)
Basophils Relative: 0 %
Eosinophils Absolute: 0 10*3/uL (ref 0.0–0.5)
Eosinophils Relative: 0 %
HCT: 41.1 % (ref 39.0–52.0)
Hemoglobin: 13.7 g/dL (ref 13.0–17.0)
Immature Granulocytes: 1 %
Lymphocytes Relative: 8 %
Lymphs Abs: 0.6 10*3/uL — ABNORMAL LOW (ref 0.7–4.0)
MCH: 31.6 pg (ref 26.0–34.0)
MCHC: 33.3 g/dL (ref 30.0–36.0)
MCV: 94.7 fL (ref 80.0–100.0)
Monocytes Absolute: 0.6 10*3/uL (ref 0.1–1.0)
Monocytes Relative: 8 %
Neutro Abs: 6.4 10*3/uL (ref 1.7–7.7)
Neutrophils Relative %: 83 %
Platelets: 266 10*3/uL (ref 150–400)
RBC: 4.34 MIL/uL (ref 4.22–5.81)
RDW: 13.9 % (ref 11.5–15.5)
WBC: 7.8 10*3/uL (ref 4.0–10.5)
nRBC: 0 % (ref 0.0–0.2)

## 2021-05-02 LAB — C-REACTIVE PROTEIN: CRP: 1.2 mg/dL — ABNORMAL HIGH (ref <1.0)

## 2021-05-02 LAB — SEDIMENTATION RATE: Sed Rate: 84 mm/hr — ABNORMAL HIGH (ref 0–16)

## 2021-05-02 IMAGING — CR DG SHOULDER 2+V*R*
3 series · 3 of 3 positions shown · non-contrast
Comparison: None.

CLINICAL DATA: Right shoulder pain.  No known injury.

EXAM:
RIGHT SHOULDER - 2+ VIEW

[shoulder grashey]
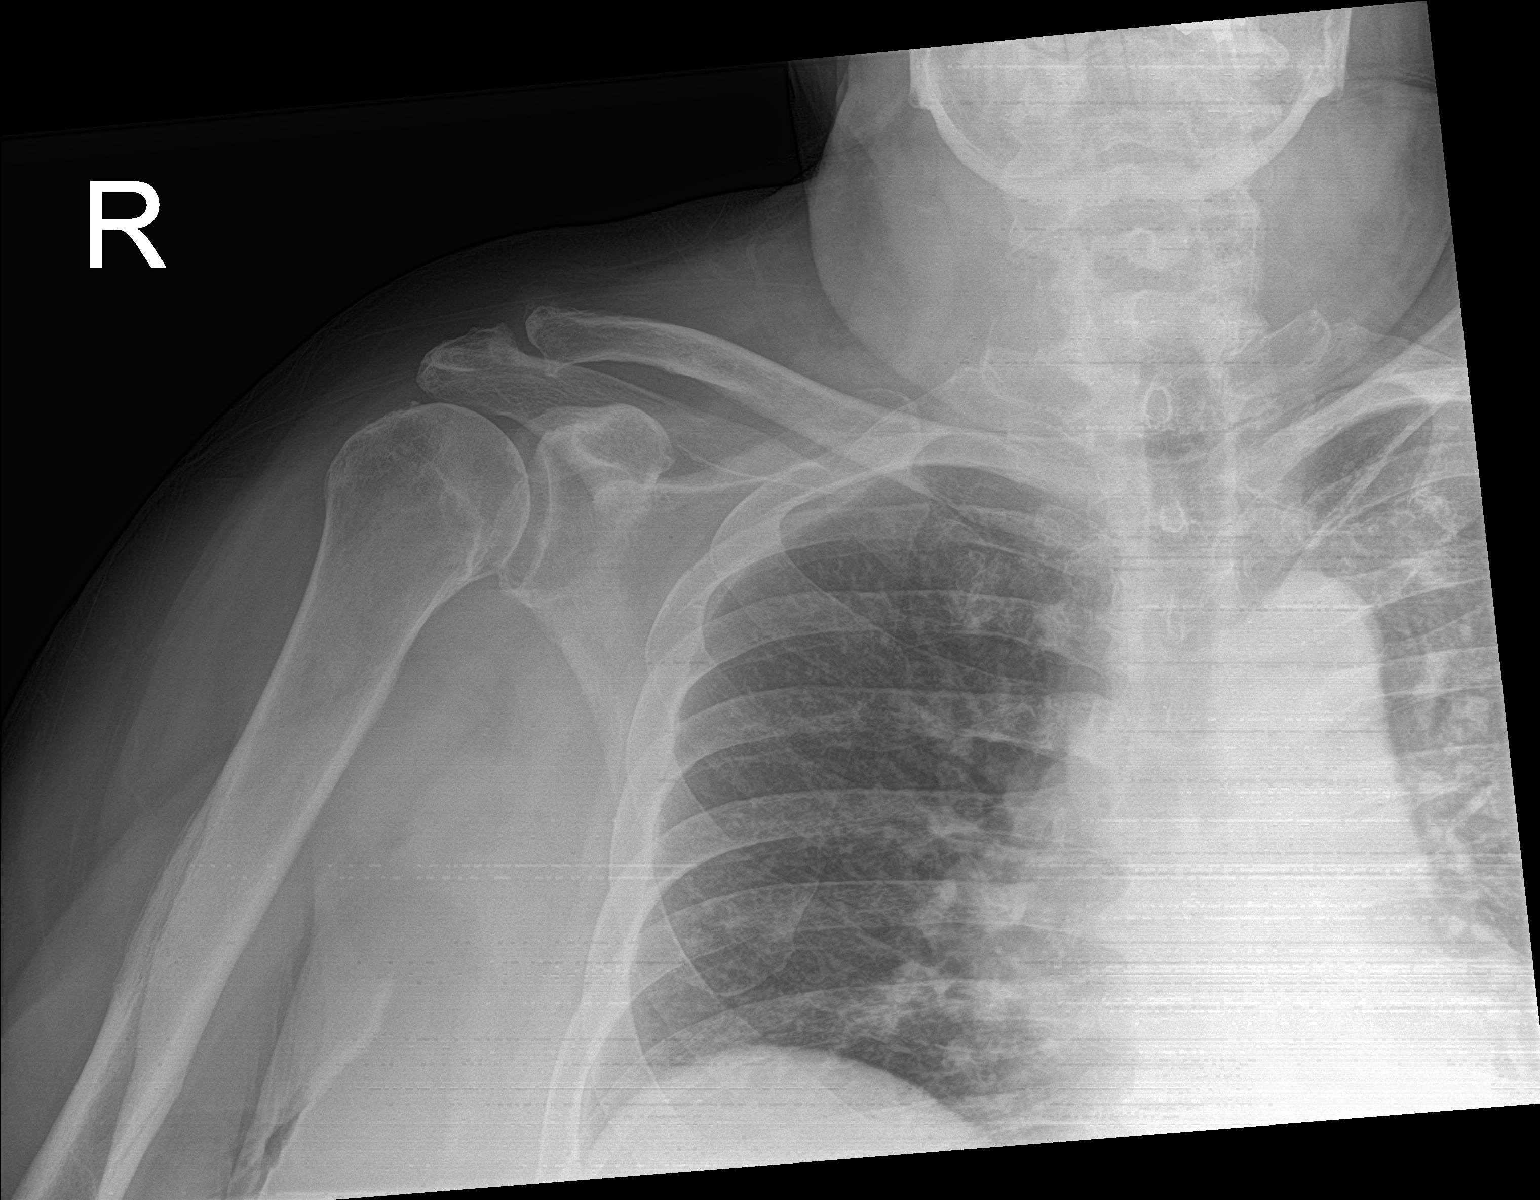

[shoulder y view]
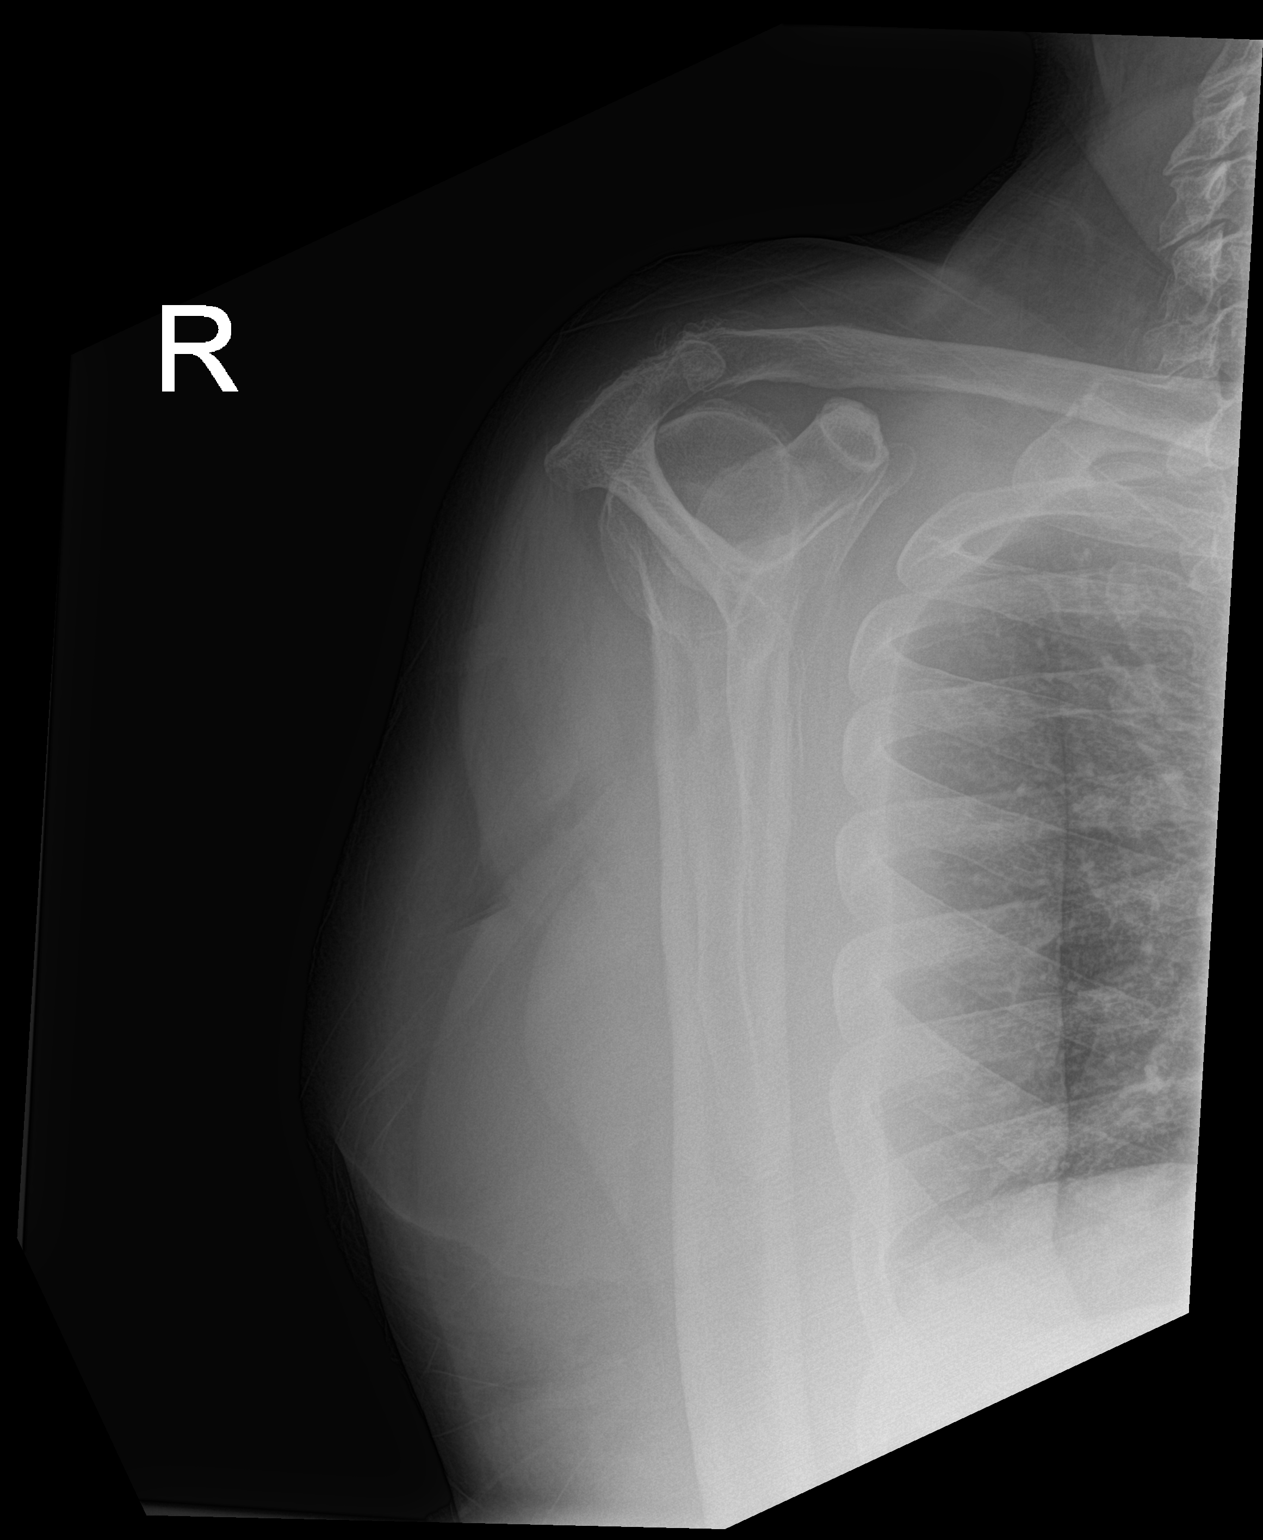

[shoulder ap neutral]
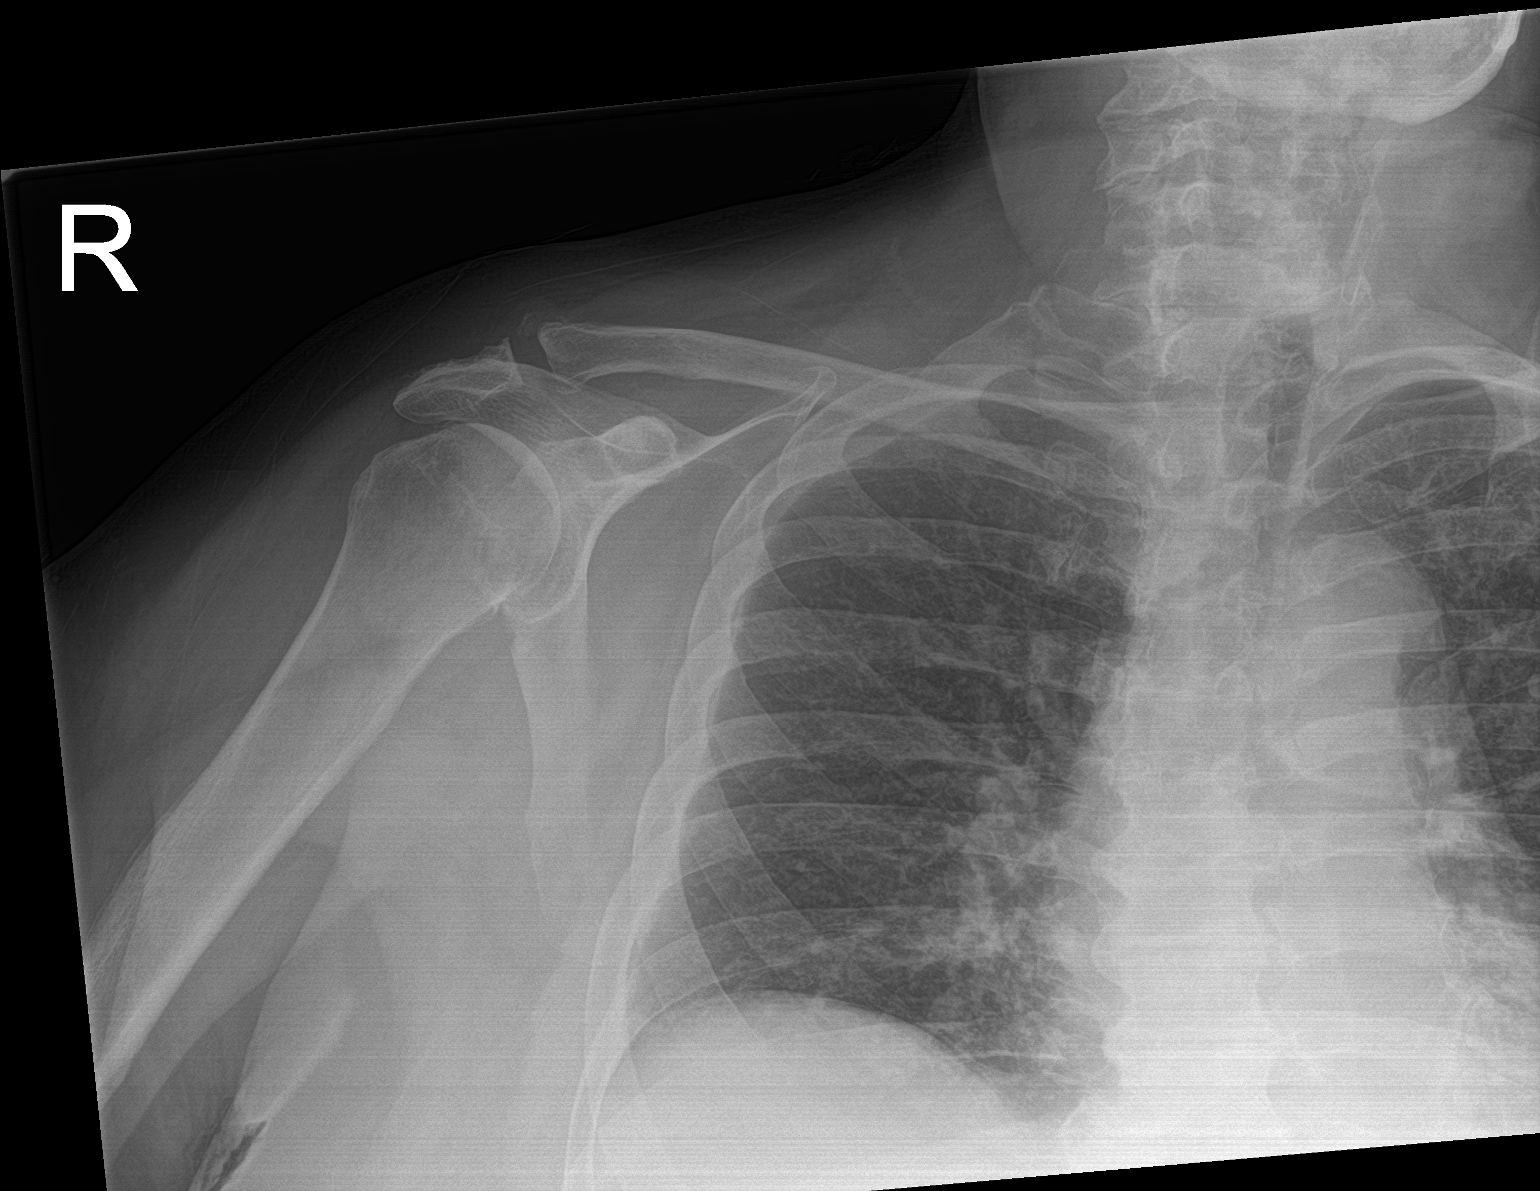

[3 of 3 positions shown; findings below may reference images not displayed]

FINDINGS: No acute fracture or dislocation. No aggressive osseous lesion.
Normal alignment. Generalized osteopenia. Mild arthropathy of the
acromioclavicular joint. No significant glenohumeral arthropathy.

Soft tissue are unremarkable. No radiopaque foreign body or soft
tissue emphysema.
IMPRESSION: 1. No acute osseous injury of the right shoulder.

## 2021-05-02 MED ORDER — PREDNISONE 20 MG PO TABS
20.0000 mg | ORAL_TABLET | Freq: Every day | ORAL | Status: DC
  Administered 2021-05-03 – 2021-05-04 (×2): 20 mg via ORAL
  Filled 2021-05-02 (×2): qty 1

## 2021-05-02 MED ORDER — PREDNISONE 10 MG PO TABS: 10.0000 mg | ORAL_TABLET | Freq: Every day | ORAL | Status: DC

## 2021-05-02 MED ORDER — TRAMADOL HCL 50 MG PO TABS
50.0000 mg | ORAL_TABLET | Freq: Four times a day (QID) | ORAL | Status: DC | PRN
  Administered 2021-05-02: 50 mg via ORAL
  Filled 2021-05-02 (×2): qty 1

## 2021-05-02 MED ORDER — PREDNISONE 10 MG PO TABS
10.0000 mg | ORAL_TABLET | Freq: Once | ORAL | Status: AC
  Administered 2021-05-02: 10 mg via ORAL

## 2021-05-02 NOTE — Progress Notes (Signed)
PROGRESS NOTE   Subjective/Complaints:  Pt reports R shoulder all of a sudden VERY painful- cannot lift shoulder past ~ 60 degrees right now and very painful.   Had never had pain in this shoulder, but started early this AM- literally said cannot use R shoulder.  Asking if injection is possible or what?  Has pain more anterior, but also posterior shoulder as well.    ROS:   Pt denies SOB, abd pain, CP, N/V/C/D, and vision changes   Objective:   No results found. Recent Labs    04/30/21 0548  WBC 4.2  HGB 13.4  HCT 39.9  PLT 240    Recent Labs    04/30/21 0548  NA 134*  K 3.9  CL 100  CO2 27  GLUCOSE 99  BUN 16  CREATININE 0.99  CALCIUM 8.8*     Intake/Output Summary (Last 24 hours) at 05/02/2021 0815 Last data filed at 05/02/2021 0803 Gross per 24 hour  Intake 960 ml  Output --  Net 960 ml        Physical Exam: Vital Signs Blood pressure (!) 147/88, pulse 76, temperature 98.7 F (37.1 C), resp. rate 16, height 5\' 11"  (1.803 m), weight (!) 138.3 kg, SpO2 98 %.           General: awake, alert, appropriate, sitting in bedside w/c; OTA in room; NAD HENT: conjugate gaze; oropharynx moist CV: regular rate; no JVD Pulmonary: CTA B/L; no W/R/R- good air movement GI: soft, NT, ND, (+)BS Psychiatric: appropriate, interactive Neurological: Ox3  Musculoskeletal: L knee still slightly swollen, but better Empty can test (+) for RTC pathology on R shoulder- cannot lift/abduct nor flex >60 degrees without severe pain- very TTP over Posterior shoulder and anterior also c/w biceps tendinitis.     Cervical back: Rigidity present. 5/5 strength with the exception of LLE 4/5- greatly improved as knee swelling is notable- moderate esp suprapatellar, however nothing to compare it to. Left leg with less muscle girth than right side Neurological:     Mental Status: He is alert and oriented to person, place, and  time.  Skin/ext::right foot with a little more blood pooling than left. Both feet warm to touch with norma cap refill   Assessment/Plan: 1. Functional deficits which require 3+ hours per day of interdisciplinary therapy in a comprehensive inpatient rehab setting. Physiatrist is providing close team supervision and 24 hour management of active medical problems listed below. Physiatrist and rehab team continue to assess barriers to discharge/monitor patient progress toward functional and medical goals  Care Tool:  Bathing  Bathing activity did not occur: Refused Body parts bathed by patient: Right arm, Left arm, Chest, Abdomen, Front perineal area, Right upper leg, Left upper leg, Right lower leg, Face, Left lower leg, Buttocks   Body parts bathed by helper: Buttocks     Bathing assist Assist Level: Contact Guard/Touching assist     Upper Body Dressing/Undressing Upper body dressing Upper body dressing/undressing activity did not occur (including orthotics): Refused (pt reported he had just changed shirt prior to evaluation) What is the patient wearing?: Pull over shirt    Upper body assist Assist Level: Set up assist  Lower Body Dressing/Undressing Lower body dressing      What is the patient wearing?: Pants, Underwear/pull up     Lower body assist Assist for lower body dressing: Contact Guard/Touching assist     Toileting Toileting Toileting Activity did not occur (Clothing management and hygiene only): Refused  Toileting assist Assist for toileting: Dependent - Patient 0%     Transfers Chair/bed transfer  Transfers assist  Chair/bed transfer activity did not occur: Refused  Chair/bed transfer assist level: Contact Guard/Touching assist     Locomotion Ambulation   Ambulation assist   Ambulation activity did not occur: Safety/medical concerns (Fatigue, pain)  Assist level: Contact Guard/Touching assist Assistive device: Walker-rolling Max distance: 233'    Walk 10 feet activity   Assist  Walk 10 feet activity did not occur: Safety/medical concerns (Fatigue, pain)  Assist level: Contact Guard/Touching assist Assistive device: Walker-rolling   Walk 50 feet activity   Assist Walk 50 feet with 2 turns activity did not occur: Safety/medical concerns (Fatigue, pain)  Assist level: Contact Guard/Touching assist Assistive device: Walker-rolling    Walk 150 feet activity   Assist Walk 150 feet activity did not occur: Safety/medical concerns  Assist level: Contact Guard/Touching assist Assistive device: Walker-rolling    Walk 10 feet on uneven surface  activity   Assist Walk 10 feet on uneven surfaces activity did not occur: Safety/medical concerns (Fatigue, pain)         Wheelchair     Assist Is the patient using a wheelchair?: Yes Type of Wheelchair: Manual    Wheelchair assist level: Dependent - Patient 0%      Wheelchair 50 feet with 2 turns activity    Assist        Assist Level: Dependent - Patient 0%   Wheelchair 150 feet activity     Assist      Assist Level: Dependent - Patient 0%   Blood pressure (!) 147/88, pulse 76, temperature 98.7 F (37.1 C), resp. rate 16, height  (1.803 m), weight (!) 138.3 kg, SpO2 98 %.    Medical Problem List and Plan: 1. 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 IVIG x 5 days completed.             -patient may shower             -ELOS/Goals: minA 14-18 days           - D/c 05/09/21  -Continue CIR- PT, OT  Thrombotics: -DVT/anticoagulation:  Pharmaceutical: Lovenox             -antiplatelet therapy: none 3. Left thumb pain: Ice 15 minutes TID. Tylenol. Spreading to right hand as well- appears to be his rheumatoid arthritis. Added voltaren gel. Increase prednisone to . Consider consulting his rheumatologist regarding  potential treatment options  1/23- L/m for pt's Rheum about his prednisone/MTX- refused MTX last week when ordered, of note. 1/24- changed MTX to start today 17.5 mg qweek; got OK from Rheum; also increased Prednisone to his home dose of 10 mg daily.   1/28- pain under reasonable control at present 1/30- will add tramadol 50 mg BID prn for back pain- has chronic pain AND bed is really hurting his back. 1/31- forgot to ask for tramadol- con't regimen and monitor- asked him to just ask for pain meds prn.  2/1- took tramadol this AM- hadn't kicked in yet- will con't tramadol but  increase to q6 hours prn  4. Mood: LCSW to evlauate and provide emotional support             -antipsychotic agents: n/a 5. Neuropsych: This patient is capable of making decisions on his own behalf. 6. Skin/Wound Care: Routine skin checks 7. Fluids/Electrolytes/Nutrition: Routine ins and outs and follow-up chemistries 8.  Peripheral polyneuropathy: Continue gabapentin 1/30- explained this is why had col feeling of feet- but wondering how GBS plays a role in changing sensation as well.  9.  Obstructive sleep apnea: Continue CPAP at night time and while napping 10: Morbid obesity: Discussed benefits of Mediterranean diet   1/24- BMI 41.82- con't reduction in calories  1/31- BMI up to 42.52- is stable to slightly up.  11: Restless leg syndrome: Continue Mirapex  1/24- will change to 2pm and 7pm per his home dose  2/1- working much better- con't regimen 12: Benign prostatic hypertrophy: Continue Flomax 13: Muscle spasm: Robaxin prn (was getting Valium on acute side)  1/23- increased robaxin to 2x/day at 3pm and 9pm per pt request and BID prn increased to 1000 mg- max 4x/day.  1/28- Muscle spasms controlled- con't regimen 14: Vitamin B12 deficiency: continue oral supplement  15: RA- Arthritis flare both hands: started on prednisone then weaned off; methotrexate home med held  1/24- restarted MTX q week today and increased  prednisone to home dose 10 mg daily.   1/31- still having AM stiffness, but better. Con't regimen 16: Overactive bladder: continue Vibegron 17: Loose stools versus constipation: continue to monitor>>Imodium PRN/anti-constipation measures. Added prn Dulxolax.  18: History of Vitamin D deficiency: check serum level  1/24- Vit D level 44.45 19: Mild thrombocytopenia: follow-up CBC  1/23- plts down slightly to 136k- will recheck in AM  1/31- Plts up to 240k- con't to monitor 20. Left knee pain: ice 15 minutes TID. Messaged ortho PA to see if he can get knee aspiration again but pain has improved- can likely defer to later in the week, discussed with ortho PA.   1/23- has already d/w ortho- they don't want to take more fluid off L knee right now-   1/28 pain appears improved-  21. Low back pain, chronic: add kpad. 22. Cold feet/neuropathy  1/28 discussed with patient that circulation appeared to be adequate in both feet. "Cold" sensation is more related to his neuropathy.Mild color difference might be related to neurological factors, positioning, etc.   22. Leukopenia  1/27- Down to 3.7- not sure cause- just under normal- will recheck Monday and could be due to prednisone?  1/30- back to 4.2- will monitor weekly.  23. R shoulder pain/RTC inflammation?  2/1- spoke with Dr Lajoyce Corners- we agreed that MRI would be needed if thought infectious, but don't see a reason for this- will try increasing his prednisone to 20 mg daily x 4 days and also increase tramadol to q6 hours prn for pain- and see if that helps. Wait to do injection for now.   I spent a total of  41 minutes on total care today- >50% coordination of care- due to calling Ortho and d/w OTA.     LOS: 12 days A FACE TO FACE EVALUATION WAS PERFORMED  Elysia Grand 05/02/2021, 8:15 AM

## 2021-05-02 NOTE — Progress Notes (Signed)
Occupational Therapy Session Note  Patient Details  Name: Tony Mcintosh MRN: [id number] Date of Birth: September 15, 1945  Today's Date: 05/02/2021 OT Individual Time: 1330-1419 OT Individual Time Calculation (min): 49 min  and Today's Date: 05/02/2021 OT Missed Time: 11 Minutes Missed Time Reason: X-Ray   Short Term Goals: Week 2:  OT Short Term Goal 1 (Week 2): Pt will complete toileting tasks with mod A OT Short Term Goal 2 (Week 2): Pt will complete bathing at shower level with min A OT Short Term Goal 3 (Week 2): Pt will maintain standing balance with CGA when pulling pants over hips  Skilled Therapeutic Interventions/Progress Updates:    Pt resting in w/c upon arrival. Engaged pt in discussion regarding transfer methods to be employed by nursing staff. Practiced sit<>stand in [place] with mod A. Sit<>stand with RW also with mod A but increased pain in Rt shoulder when using RW for transfer. Safety plane changed to +2 Stedy. Nusrsing staff notified. Rt shoulder painful when standing with RW and in [place]. See pain note below. Pt returned to bed for transport to x-ray. Sit>supine with supervision. Pt missed 11 mins OT intervention.  Therapy Documentation Precautions:  Precautions Precautions: Fall Precaution Comments: L knee buckles Restrictions Weight Bearing Restrictions: No General: General OT Amount of Missed Time: 11 Minutes Pain:  Pt reports "moderate" Rt shoulder pain when resting, increasing to 10/10 with active movement; repositioned    Therapy/Group: Individual Therapy  Rich BraveLanier, Emmalou Hunger Chappell 05/02/2021, 2:24 PM

## 2021-05-02 NOTE — Progress Notes (Signed)
Patient ID: Tony Mcintosh, male   DOB: October 21, 1945, 76 y.o.   MRN: [id number]  SW met with pt in to discuss plan for transportation. Patient reports [facility] would [facility]. SW reminded pt on w/c will be needed here prior to discharge. SW also discussed with him to discuss with his wife on family education this Friday if possible. SW will f/u.  SW informed [facility] [place] on pt transportation. Updates report that she reminded physician again to place [facility] consult to begin the process, and has placed transportation request.  Loralee Pacas, MSW, [place] Office: [phone] Cell: [phone] Fax: [phone]

## 2021-05-02 NOTE — Progress Notes (Signed)
Occupational Therapy Session Note  Patient Details  Name: Tony Mcintosh MRN: [id number] Date of Birth: February 08, 1946  Today's Date: 05/02/2021 OT Individual Time: 0700-0755 OT Individual Time Calculation (min): 55 min  and Today's Date: 05/02/2021 OT Missed Time: 20 Minutes Missed Time Reason: Pain   Short Term Goals: Week 2:  OT Short Term Goal 1 (Week 2): Pt will complete toileting tasks with mod A OT Short Term Goal 2 (Week 2): Pt will complete bathing at shower level with min A OT Short Term Goal 3 (Week 2): Pt will maintain standing balance with CGA when pulling pants over hips  Skilled Therapeutic Interventions/Progress Updates:    Pt resting in w/c upon arrival. Pt reports that he "can't move" his Rt arm (see below). MD assessed and will contact Ortho in hospital for steroid injections. Dr Dagoberto Ligas stated she believes it is a rotator cuff tear. Pt will to go over to sink to brush teeth while standing. Sit<>stand with mod A. Standing balance with CGA but pt requested to return to w/c when he needed increased BUE support for standing. Discussed discharge plans. Will reassess in the afternoon. Pt remained in w/c with all needs within reach.   Therapy Documentation Precautions:  Precautions Precautions: Fall Precaution Comments: L knee buckles Restrictions Weight Bearing Restrictions: No General: General OT Amount of Missed Time: 20 Minutes  Pain:  Pt with increased Rt shoulder pain with abduction, elbow extension, and shoulder flexion; MD aware and assessed   Therapy/Group: Individual Therapy  Leroy Libman 05/02/2021, 8:01 AM

## 2021-05-02 NOTE — Progress Notes (Signed)
Physical Therapy Session Note  Patient Details  Name: Tony Mcintosh MRN: [id number] Date of Birth: 1945/04/20  Today's Date: 05/02/2021 PT Individual Time: 0905-1000 PT Individual Time Calculation (min): 55 min   Short Term Goals: Week 2:  PT Short Term Goal 1 (Week 2): =LTG due to ELOS  Skilled Therapeutic Interventions/Progress Updates: Pt presented in w/c agreeable to therapy. This am pt with acute RC pain in R shoulder. Pt unable to perfrom ROM or any pushing/pulling through RUE this session due to pain. Session to focus on pain management of R shoulder and BLE strengthening. Pt transported to ortho gym and PTA performed US for pain management. Pt received theraputic [place] for pain management at 3.3 MHz and 1.5 w/cm intensity. IT was then followed up by IFC dual channel x 15 min intensity 17-26 while pt participated in seated LE therex. Due to L knee pain no weight was used on LLE, but 4lb cuff on RLE Pt performed ankle pump with knee extension x 15, LAQ x 10, hip abd/add x 15, seated march x 15, manually resisted hamstring pulls. Pt noted to be more lethargic towards end of session. Pt transported back to room at end of session and set up with ice pack on R shoulders. Pt placed timer set for 20 min and instructed to remove once completed, nsg also notified of pt's disposition. Pt left in w/c at end of session with call bell within reach and needs met.      Therapy Documentation Precautions:  Precautions Precautions: Fall Precaution Comments: L knee buckles Restrictions Weight Bearing Restrictions: No General:   Vital Signs:  Pain: Pain Assessment Pain Score: 3  Mobility:   Locomotion :    Trunk/Postural Assessment :    Balance:   Exercises:   Other Treatments:      Therapy/Group: Individual Therapy  Deseray Daponte 05/02/2021, 1:03 PM

## 2021-05-02 NOTE — Progress Notes (Addendum)
Patient reevaluated regarding the left knee pain and effusion he was experiencing in mid-to-late January.  Today reports that his left knee pain has been steadily improving.  He has had no significant swelling.  He has been improving his range of motion and been able to work with physical therapy.  He states he has been walking 300 feet with almost no left knee pain.  Denies any fevers, chills, night sweats.  Aspirations and injection with Toradol helped his left knee pain.  More concerning for him today is the development of acute right shoulder pain.  He states that he woke up this morning fine but about an hour after he woke up he began to experience severe right shoulder pain that he localizes to the lateral, superior, posterior aspects of the right shoulder.  He has no radiation of this pain.  No scapular pain.  No neck pain that is bothering him that is new.  He states he really cannot do anything with his right shoulder as anytime he lifts it up he has severe pain.  He has no history of prior injury to the right shoulder.  No history of prior right shoulder surgery.  No radicular pain down the arm.  Ortho exam demonstrates left knee with no effusion.  Range of motion from 0 degrees extension to 110 degrees of flexion.  No calf tenderness.  Negative Homans' sign.  No medial or lateral joint line tenderness to any significant degree.  No increased warmth noted compared to the contralateral knee.  No erythema noted. Right shoulder with no pain with passive range of motion below shoulder level with just internal rotation/external rotation.  No pain with passive AB duction up until about 60 to 70 degrees of abduction.  No tenderness throughout the axial cervical spine.  Moderate tenderness over the [facility] joint on the right shoulder.  No asymmetric swelling or warmth of the right AC joint compared to the left AC joint.  He has severe pain with supraspinatus strength testing on exam.  Mild pain with infraspinatus  and subscapularis testing.  No tenderness over the bicipital groove.   Plan: No further intervention required on the left knee.  He does have history of rheumatoid arthritis and has recently resumed methotrexate so could have been that his left knee pain and swelling was from a rheumatoid flare.  Regarding the right shoulder, plan to order radiographs of the right shoulder.  With his severe pain on supraspinatus strength testing and inability to really AB duct his arm actively, concern for rotator cuff damage.  He has been transferring and using a walker more over the last several weeks which could be placing stress on the rotator cuff.  Plan review x-rays and monitor patient over the next day.  No signs or symptoms of septic arthritis aside from the acute onset of shoulder pain. Anticipate MRI in outpatient setting if continued pain with difficulty lifting arm.

## 2021-05-03 LAB — COMPREHENSIVE METABOLIC PANEL
ALT: 94 U/L — ABNORMAL HIGH (ref 0–44)
AST: 53 U/L — ABNORMAL HIGH (ref 15–41)
Albumin: 3 g/dL — ABNORMAL LOW (ref 3.5–5.0)
Alkaline Phosphatase: 55 U/L (ref 38–126)
Anion gap: 9 (ref 5–15)
BUN: 13 mg/dL (ref 8–23)
CO2: 26 mmol/L (ref 22–32)
Calcium: 9.1 mg/dL (ref 8.9–10.3)
Chloride: 97 mmol/L — ABNORMAL LOW (ref 98–111)
Creatinine, Ser: 0.9 mg/dL (ref 0.61–1.24)
GFR, Estimated: >60 mL/min (ref >60)
Glucose, Bld: 107 mg/dL — ABNORMAL HIGH (ref 70–99)
Potassium: 3.9 mmol/L (ref 3.5–5.1)
Sodium: 132 mmol/L — ABNORMAL LOW (ref 135–145)
Total Bilirubin: 0.6 mg/dL (ref 0.3–1.2)
Total Protein: 7.9 g/dL (ref 6.5–8.1)

## 2021-05-03 MED ORDER — HYDROCODONE-ACETAMINOPHEN 5-325 MG PO TABS
1.0000 | ORAL_TABLET | Freq: Four times a day (QID) | ORAL | Status: DC | PRN
  Administered 2021-05-03 – 2021-05-05 (×5): 1 via ORAL
  Filled 2021-05-03 (×6): qty 1

## 2021-05-03 MED ORDER — TRIAMCINOLONE ACETONIDE 40 MG/ML IJ SUSP
80.0000 mg | Freq: Once | INTRAMUSCULAR | Status: AC
  Administered 2021-05-03: 80 mg via INTRAMUSCULAR
  Filled 2021-05-03: qty 2

## 2021-05-03 MED ORDER — LIDOCAINE HCL 1 % IJ SOLN
10.0000 mL | Freq: Once | INTRAMUSCULAR | Status: AC
  Administered 2021-05-03: 10 mL
  Filled 2021-05-03: qty 10

## 2021-05-03 NOTE — Progress Notes (Signed)
Occupational Therapy Session Note  Patient Details  Name: Tony Mcintosh MRN: [id number] Date of Birth: 03/09/1946  Today's Date: 05/03/2021 OT Individual Time: 0700-0809 OT Individual Time Calculation (min): 69 min    Short Term Goals: Week 2:  OT Short Term Goal 1 (Week 2): Pt will complete toileting tasks with mod A OT Short Term Goal 2 (Week 2): Pt will complete bathing at shower level with min A OT Short Term Goal 3 (Week 2): Pt will maintain standing balance with CGA when pulling pants over hips  Skilled Therapeutic Interventions/Progress Updates:    Pt resting in w/c upon arrival. Pt reports his Rt shoulder is marginally "better." OT intervention with focus on sit<>stand, functional amb with RW, bathing at shower level, grooming seated in w/c at sink, and dressing with sit<>stand from w/c. Pt required more assistance this morning 2/2 Rt shoulder/UE limitations but completed bathing/dressing tasks with min A. Pt required increased time to complete tasks this morning. Sit<>stand with min A. Standing balance and amb with RW at [facility]. Discussed shower setup at home. Glass doors will NOT be removed prior to discharge. Will continue to explore alternatives. Pt remained in w/c with all needs within reach.   Therapy Documentation Precautions:  Precautions Precautions: Fall Precaution Comments: L knee buckles Restrictions Weight Bearing Restrictions: No   Pain: Pt reports increase in Rt shoulder pain with specific movements; shower, heat, and repositioning, MD aware   Therapy/Group: Individual Therapy  Rich Brave 05/03/2021, 8:11 AM

## 2021-05-03 NOTE — Progress Notes (Addendum)
Patient ID: Tony Mcintosh, male   DOB: [birth year], 76 y.o.   MRN: [id number]  SW received updates from [place] reporting that [employer] has been scheduled with [facility] ([phone]) for 10am pick up. Stated that [facility] remains on back order at this time. Waiting on physician to place order for [facility] as physician computer system was down.   *SW met with pt and pt wife in room to discuss above. Pt will wait for [facility] to arrive. Pt aware there will be confirmation from [place] when [facility] is in place.   1437- received updates from [place] stating referral was accepted by [facility] (p:[phone]/f:[phone]).  SW spoke with [facility] to inquire if there are any needs. Reports to only inform if d/c date changes.   Loralee Pacas, MSW, [place] Office: [phone] Cell: [phone] Fax: [phone]

## 2021-05-03 NOTE — Progress Notes (Signed)
Physical Therapy Session Note  Patient Details  Name: Tony Mcintosh MRN: [id number] Date of Birth: 1946-02-23  Today's Date: 05/03/2021 PT Individual Time: [phone] and 1350-1445 PT Individual Time Calculation (min): 40 min and 55 min  Short Term Goals: Week 2:  PT Short Term Goal 1 (Week 2): =LTG due to ELOS  Skilled Therapeutic Interventions/Progress Updates: Pt presented in w/c agreeable to therapy. Pt states pain improved from yesterday in R shoulder but still significant rates 2/10 at rest up to 71/06 with certain movements. Pt transported to day room for energy conservation. Perform stand pivot from w/c with minA for Sit to stand and CGA for transfer. Pt then participate din Sit to stand 2 x 6 from 23in and 22in height respectively for work on BLE. Pt using LUE only to assist in pushing up from mat but performing overall without AD. Pt then participated in ambulation with RW ~89f with x 2 turns with CGA. After brief rest pt ambulated an additional 1018fwith w/c follow CGA. Pt transported remaining distance back to room and remained in w/c at end of session. Pt left with call bell within reach, ice pack on R shoulder with timer set for 20 min and current needs met.   Tx2: Pt presented in w/c agreeable to therapy. Pt states received steroid injection earlier today and is feeling affects of lidocaine. Discussed current safely plan, pt states extremely difficult to use Stedy particularly from w/c or elevated toilet. Pt states feels more comfortable this am using RW. Pt and PTA trialed transfers form w/c, toilet with elevated toilet and standard toilet both with use of wall rails. PTA changed safety plan to ambulatory transfer with RW and notified nsg staff. Pt then transported to rehab gym and participated in ambulation 10047fith RW and CGA. Pt encouraged to improve erect posture and decreased pressure on RW. Participated in toe taps to 4in step x 10 with emphasis on increasing wt bearing  through stance LE and decreased wt on UE. Discussed that although shoulder pain pt continues to demonstrate good functional mobility. Pt ambulated back to room with x 3 brief standing rest breaks and returned to w/c at end of session. Pt left in w/c with call bell within reach and needs met.      Therapy Documentation Precautions:  Precautions Precautions: Fall Precaution Comments: L knee buckles Restrictions Weight Bearing Restrictions: No General:   Vital Signs:  Pain: Pain Assessment Pain Scale: 0-10 Pain Score: 4  Pain Type: Acute pain Pain Location: Shoulder Pain Orientation: Right Pain Descriptors / Indicators: Aching;Squeezing;Constant;Heaviness;Pressure;Tender;Sharp;Discomfort Pain Onset: On-going Patients Stated Pain Goal: 2 Pain Intervention(s): Medication (See eMAR);Repositioned;Pain med given for lower pain score than stated, per patient request;Cold applied;Rest Multiple Pain Sites: No Mobility:   Locomotion :    Trunk/Postural Assessment :    Balance:   Exercises:   Other Treatments:      Therapy/Group: Individual Therapy  Dequann Vandervelden 05/03/2021, 12:32 PM

## 2021-05-03 NOTE — Progress Notes (Signed)
PROGRESS NOTE   Subjective/Complaints:  Pain has more localized to R top of shoulder and posterior shoulder.  Less so anterior now.  Shoulder less tender to light palpation, but no change with ability to lift arm and pain with using RW/trying to put shirt on/off.   L knee is doing well.  Tramadol not helpful- made him sleepy, but didn't help pain. Has used norco in past without sedation.  Got U/S and TENS unit on R shoulder by therapy yesterday- mild improvement.    ROS:   Pt denies SOB, abd pain, CP, N/V/C/D, and vision changes   Objective:   DG Shoulder Right  Result Date: 05/02/2021 CLINICAL DATA:  Right shoulder pain.  No known injury. EXAM: RIGHT SHOULDER - 2+ VIEW COMPARISON:  None. FINDINGS: No acute fracture or dislocation. No aggressive osseous lesion. Normal alignment. Generalized osteopenia. Mild arthropathy of the acromioclavicular joint. No significant glenohumeral arthropathy. Soft tissue are unremarkable. No radiopaque foreign body or soft tissue emphysema. IMPRESSION: 1. No acute osseous injury of the right shoulder. Electronically Signed   By: Elige Ko M.D.   On: 05/02/2021 14:48   Recent Labs    05/02/21 1314  WBC 7.8  HGB 13.7  HCT 41.1  PLT 266    Recent Labs    05/03/21 0522  NA 132*  K 3.9  CL 97*  CO2 26  GLUCOSE 107*  BUN 13  CREATININE 0.90  CALCIUM 9.1     Intake/Output Summary (Last 24 hours) at 05/03/2021 1029 Last data filed at 05/03/2021 0757 Gross per 24 hour  Intake 720 ml  Output 2200 ml  Net -1480 ml        Physical Exam: Vital Signs Blood pressure (!) 144/76, pulse 67, temperature 98 F (36.7 C), resp. rate 18, height  (1.803 m), weight (!) 138.3 kg, SpO2 97 %.            General: awake, alert, appropriate, NAD HENT: conjugate gaze; oropharynx moist CV: regular rate; no JVD Pulmonary: CTA B/L; no W/R/R- good air movement GI: soft, NT, ND,  (+)BS Psychiatric: appropriate Neurological: Ox3  Musculoskeletal: L knee still slightly swollen, but much better Empty can test (+) for RTC pathology on R shoulder- still- - cannot lift/abduct nor flex >60 degrees without severe pain- very TTP over Posterior shoulder and top of shoulder c/w R AC DJD/pathology- which is c/w xray-  wincing with trying to get shirt over arm/head.     Cervical back: Rigidity present. 5/5 strength with the exception of LLE 4/5- greatly improved as knee swelling is notable- moderate esp suprapatellar, however nothing to compare it to. Left leg with less muscle girth than right side Neurological:     Mental Status: He is alert and oriented to person, place, and time.  Skin/ext::right foot with a little more blood pooling than left. Both feet warm to touch with norma cap refill   Assessment/Plan: 1. Functional deficits which require 3+ hours per day of interdisciplinary therapy in a comprehensive inpatient rehab setting. Physiatrist is providing close team supervision and 24 hour management of active medical problems listed below. Physiatrist and rehab team continue to assess barriers to discharge/monitor  patient progress toward functional and medical goals  Care Tool:  Bathing  Bathing activity did not occur: Refused Body parts bathed by patient: Right arm, Left arm, Chest, Abdomen, Front perineal area, Right upper leg, Left upper leg, Right lower leg, Face, Left lower leg   Body parts bathed by helper: Buttocks     Bathing assist Assist Level: Minimal Assistance - Patient > 75%     Upper Body Dressing/Undressing Upper body dressing Upper body dressing/undressing activity did not occur (including orthotics): Refused (pt reported he had just changed shirt prior to evaluation) What is the patient wearing?: Pull over shirt    Upper body assist Assist Level: Set up assist    Lower Body Dressing/Undressing Lower body dressing      What is the patient  wearing?: Pants, Underwear/pull up     Lower body assist Assist for lower body dressing: Contact Guard/Touching assist     Toileting Toileting Toileting Activity did not occur (Clothing management and hygiene only): Refused  Toileting assist Assist for toileting: Dependent - Patient 0%     Transfers Chair/bed transfer  Transfers assist  Chair/bed transfer activity did not occur: Refused  Chair/bed transfer assist level: Contact Guard/Touching assist     Locomotion Ambulation   Ambulation assist   Ambulation activity did not occur: Safety/medical concerns (Fatigue, pain)  Assist level: Contact Guard/Touching assist Assistive device: Walker-rolling Max distance: 233'   Walk 10 feet activity   Assist  Walk 10 feet activity did not occur: Safety/medical concerns (Fatigue, pain)  Assist level: Contact Guard/Touching assist Assistive device: Walker-rolling   Walk 50 feet activity   Assist Walk 50 feet with 2 turns activity did not occur: Safety/medical concerns (Fatigue, pain)  Assist level: Contact Guard/Touching assist Assistive device: Walker-rolling    Walk 150 feet activity   Assist Walk 150 feet activity did not occur: Safety/medical concerns  Assist level: Contact Guard/Touching assist Assistive device: Walker-rolling    Walk 10 feet on uneven surface  activity   Assist Walk 10 feet on uneven surfaces activity did not occur: Safety/medical concerns (Fatigue, pain)         Wheelchair     Assist Is the patient using a wheelchair?: Yes Type of Wheelchair: Manual    Wheelchair assist level: Dependent - Patient 0%      Wheelchair 50 feet with 2 turns activity    Assist        Assist Level: Dependent - Patient 0%   Wheelchair 150 feet activity     Assist      Assist Level: Dependent - Patient 0%   Blood pressure (!) 144/76, pulse 67, temperature 98 F (36.7 C), resp. rate 18, height 5\' 11"  (1.803 m), weight (!) 138.3  kg, SpO2 97 %.    Medical Problem List and Plan: 1. 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 IVIG x 5 days completed.             -patient may shower             -ELOS/Goals: minA 14-18 days           - D/c 05/09/21  Continue CIR- PT, OT - R shoulder new pain has limited therapy- might need to extend d/c date? Can continue TENS/and U/S and icing for now.  Thrombotics: -DVT/anticoagulation:  Pharmaceutical: Lovenox             -antiplatelet  therapy: none 3. Left thumb pain: Ice 15 minutes TID. Tylenol. Spreading to right hand as well- appears to be his rheumatoid arthritis. Added voltaren gel. Increase prednisone to 5mg . Consider consulting his rheumatologist regarding potential treatment options  1/23- L/m for pt's Rheum about his prednisone/MTX- refused MTX last week when ordered, of note. 1/24- changed MTX to start today 17.5 mg qweek; got OK from Rheum; also increased Prednisone to his home dose of 10 mg daily.   1/28- pain under reasonable control at present 1/30- will add tramadol 50 mg BID prn for back pain- has chronic pain AND bed is really hurting his back. 1/31- forgot to ask for tramadol- con't regimen and monitor- asked him to just ask for pain meds prn.  2/1- took tramadol this AM- hadn't kicked in yet- will con't tramadol but increase to q6 hours prn   2/2- tramadol didn't help- will change to norco and d/c tramadol since sedating but not helpful- will do steroid injection of R shoulder today per pt request.  4. Mood: LCSW to evlauate and provide emotional support             -antipsychotic agents: n/a 5. Neuropsych: This patient is capable of making decisions on his own behalf. 6. Skin/Wound Care: Routine skin checks 7. Fluids/Electrolytes/Nutrition: Routine ins and outs and follow-up chemistries 8.  Peripheral polyneuropathy: Continue gabapentin 1/30-  explained this is why had col feeling of feet- but wondering how GBS plays a role in changing sensation as well.  9.  Obstructive sleep apnea: Continue CPAP at night time and while napping 10: Morbid obesity: Discussed benefits of Mediterranean diet   1/24- BMI 41.82- con't reduction in calories  1/31- BMI up to 42.52- is stable to slightly up.  11: Restless leg syndrome: Continue Mirapex  1/24- will change to 2pm and 7pm per his home dose  2/1- working much better- con't regimen 12: Benign prostatic hypertrophy: Continue Flomax 13: Muscle spasm: Robaxin prn (was getting Valium on acute side)  1/23- increased robaxin to 2x/day at 3pm and 9pm per pt request and BID prn increased to 1000 mg- max 4x/day.  1/28- Muscle spasms controlled- con't regimen 14: Vitamin B12 deficiency: continue oral supplement  15: RA- Arthritis flare both hands: started on prednisone then weaned off; methotrexate home med held  1/24- restarted MTX q week today and increased prednisone to home dose 10 mg daily.   1/31- still having AM stiffness, but better. Con't regimen 16: Overactive bladder: continue Vibegron 17: Loose stools versus constipation: continue to monitor>>Imodium PRN/anti-constipation measures. Added prn Dulxolax.  18: History of Vitamin D deficiency: check serum level  1/24- Vit D level 44.45 19: Mild thrombocytopenia: follow-up CBC  1/23- plts down slightly to 136k- will recheck in AM  1/31- Plts up to 240k- con't to monitor 20. Left knee pain: ice 15 minutes TID. Messaged ortho PA to see if he can get knee aspiration again but pain has improved- can likely defer to later in the week, discussed with ortho PA.   1/23- has already d/w ortho- they don't want to take more fluid off L knee right now-   1/28 pain appears improved-  21. Low back pain, chronic: add kpad. 22. Cold feet/neuropathy  1/28 discussed with patient that circulation appeared to be adequate in both feet. "Cold" sensation is more  related to his neuropathy.Mild color difference might be related to neurological factors, positioning, etc.   22. Leukopenia  1/27- Down to 3.7- not sure cause- just  under normal- will recheck Monday and could be due to prednisone?  1/30- back to 4.2- will monitor weekly.  23. R shoulder pain/RTC inflammation?  2/1- spoke with Dr Lajoyce Corners- we agreed that MRI would be needed if thought infectious, but don't see a reason for this- will try increasing his prednisone to 20 mg daily x 4 days and also increase tramadol to q6 hours prn for pain- and see if that helps. Wait to do injection for now.   2/2- pain hasn't improved too much- even though icing and using heat as well- will do steroid injection of R RTC/and R subacromial steroid injection  Procedure note: steroid injection was performed at bR posterior shoulder/RTC and R subacromial using 1% plain Lidocaine and  /1cc of Kenalog for each injection. This was well tolerated.  Cleaned with betadine x3 and allowed to dry- then alcohol then injected using 27 gauge 1.5 inch needle- no bleeding or complications.    F/U in 3 months for steroid injections of shoulders/bsubacromial  tendons.  Lidocaine will kick in 15 minutes- and wear off tonight- the steroid will kick in tomorrow within 24 hours and take up to 72 hours to fully kick in.   I spent a total of  55  minutes on total care today- >50% coordination of care- due to doing steroid injections of R subacromial and R RTC/posterior shoulder  Also went over xray- and Ortho note- xray not diagnostic.    LOS: 13 days A FACE TO FACE EVALUATION WAS PERFORMED  Mckynzi Cammon 05/03/2021, 10:29 AM

## 2021-05-03 NOTE — Progress Notes (Signed)
Physical Therapy Session Note  Patient Details  Name: Tony Mcintosh MRN: [id number] Date of Birth: [birth year]  Today's Date: 05/03/2021 PT Individual Time: [phone] PT Individual Time Calculation (min): 54 min   Short Term Goals:  Week 2:  PT Short Term Goal 1 (Week 2): =LTG due to ELOS   Skilled Therapeutic Interventions/Progress Updates:   Pt received sitting in WC and agreeable to PT. Pt transported to rehab gym in [facility]. Gait training with RW 2 x 55f with CGA for safety. Pt noted to have labored breathing with mild SOB folloiwing each bout and mild increase in shoulder pain on the RUE following second bout, but does not rate. Pt required therapeutic rest break between bouts due to SOB and BLE fatigue.   PT instructed pt in dynamic balance and standing tolerance task of Wii bowling. Pt able to tolerate 3 bouts of standing for 2 frames each with LUE supported on RW for support. Mild L knee instability on second bout, but able to correct without LOB. Cues for improved posture, neutral knee position and decreased use of RUE for balance in standing as tolerated. [facility] and all needs met.         Therapy Documentation Precautions:  Precautions Precautions: Fall Precaution Comments: L knee buckles Restrictions Weight Bearing Restrictions: No  Vital Signs: Therapy Vitals Temp: 98.5 F (36.9 C) Pulse Rate: 81 Resp: 16 BP: 129/75 Patient Position (if appropriate): Sitting Oxygen Therapy SpO2: 96 % O2 Device: Room Air Pain: denies    Therapy/Group: Individual Therapy  ALorie Phenix2/05/2021, 5:03 PM

## 2021-05-04 MED ORDER — PREDNISONE 10 MG PO TABS
10.0000 mg | ORAL_TABLET | Freq: Every day | ORAL | Status: DC
  Administered 2021-05-05 – 2021-05-09 (×5): 10 mg via ORAL
  Filled 2021-05-04 (×5): qty 1

## 2021-05-04 NOTE — Progress Notes (Signed)
Occupational Therapy Session Note  Patient Details  Name: Tony Mcintosh MRN: [id number] Date of Birth: Jul 22, 1945  Today's Date: 05/04/2021 OT Individual Time: 0700-0825 OT Individual Time Calculation (min): 85 min    Short Term Goals: Week 3:  OT Short Term Goal 1 (Week 3): STG=LTG 2/2 ELOS (continue to work towards supervision/min A goals)  Skilled Therapeutic Interventions/Progress Updates:    Pt resting in w/c upon arrival. OT intervention with focus on functional amb with RW, standing balance, bathing at shower level, dressing with sit<>stand from w/c, discharge planning regarding shower transfers, and safety awareness to increase independence with BADLs. Pt amb with RW to bathroom and completed bathing at shower level with CGA when standing. Pt required CGA for amb with RW. CGA for standing to pull pants over hips and when brushing teeth standing at sink. Transitioned to ADL apt to problem solve shower transfers. Pt's shower at home has sliding doors with 8" step. Recommended that pt not attempt shower transfer until [facility] makes recommendations. Pt is hoping [place] with pay for shower modifications to make it accessible. Pt enaged in standing tasks with cornhole. Pt also retrieved bean bags from floor with reacher and stood to clean all bags. Pt returned to room and remained in w/c. All needs within reach.   Therapy Documentation Precautions:  Precautions Precautions: Fall Precaution Comments: L knee buckles Restrictions Weight Bearing Restrictions: No   Pain:  Pt reports his Rt shoulder is feeling much better  Therapy/Group: Individual Therapy  Rich Brave 05/04/2021, 8:31 AM

## 2021-05-04 NOTE — Progress Notes (Signed)
PROGRESS NOTE   Subjective/Complaints:   Pain MUCH better since got injections yesterday- a little sore, but doesn't "hurt" anymore.  Thinks won't need to extend his rehab stay now.  Slept like a log from 10-4am.   ROS:   Pt denies SOB, abd pain, CP, N/V/C/D, and vision changes   Objective:   DG Shoulder Right  Result Date: 05/02/2021 CLINICAL DATA:  Right shoulder pain.  No known injury. EXAM: RIGHT SHOULDER - 2+ VIEW COMPARISON:  None. FINDINGS: No acute fracture or dislocation. No aggressive osseous lesion. Normal alignment. Generalized osteopenia. Mild arthropathy of the acromioclavicular joint. No significant glenohumeral arthropathy. Soft tissue are unremarkable. No radiopaque foreign body or soft tissue emphysema. IMPRESSION: 1. No acute osseous injury of the right shoulder. Electronically Signed   By: Elige Ko M.D.   On: 05/02/2021 14:48   Recent Labs    05/02/21 1314  WBC 7.8  HGB 13.7  HCT 41.1  PLT 266    Recent Labs    05/03/21 0522  NA 132*  K 3.9  CL 97*  CO2 26  GLUCOSE 107*  BUN 13  CREATININE 0.90  CALCIUM 9.1     Intake/Output Summary (Last 24 hours) at 05/04/2021 0824 Last data filed at 05/04/2021 0742 Gross per 24 hour  Intake 480 ml  Output 600 ml  Net -120 ml        Physical Exam: Vital Signs Blood pressure 124/76, pulse 73, temperature 97.7 F (36.5 C), resp. rate 18, height  (1.803 m), weight (!) 138.3 kg, SpO2 97 %.             General: awake, alert, appropriate, sitting up in w/c at bedside; OTA in room; NAD HENT: conjugate gaze; oropharynx moist CV: regular rate; no JVD Pulmonary: CTA B/L; no W/R/R- good air movement GI: soft, NT, ND, (+)BS- protuberant Psychiatric: appropriate Neurological: Ox3 Musculoskeletal:  R shoulder much less TTP as well as able to lift R arm above head with flexion and abduction.  L knee still slightly swollen, but much  better 2/2 Empty can test (+) for RTC pathology on R shoulder- still- - cannot lift/abduct nor flex >60 degrees without severe pain- very TTP over Posterior shoulder and top of shoulder c/w R AC DJD/pathology- which is c/w xray-  wincing with trying to get shirt over arm/head.     Cervical back: Rigidity present. 5/5 strength with the exception of LLE 4/5- greatly improved as knee swelling is notable- moderate esp suprapatellar, however nothing to compare it to. Left leg with less muscle girth than right side Neurological:     Mental Status: He is alert and oriented to person, place, and time.  Skin/ext::right foot with a little more blood pooling than left. Both feet warm to touch with norma cap refill   Assessment/Plan: 1. Functional deficits which require 3+ hours per day of interdisciplinary therapy in a comprehensive inpatient rehab setting. Physiatrist is providing close team supervision and 24 hour management of active medical problems listed below. Physiatrist and rehab team continue to assess barriers to discharge/monitor patient progress toward functional and medical goals  Care Tool:  Bathing  Bathing activity did not occur:  Refused Body parts bathed by patient: Right arm, Left arm, Chest, Abdomen, Front perineal area, Right upper leg, Left upper leg, Right lower leg, Face, Left lower leg   Body parts bathed by helper: Buttocks     Bathing assist Assist Level: Minimal Assistance - Patient > 75%     Upper Body Dressing/Undressing Upper body dressing Upper body dressing/undressing activity did not occur (including orthotics): Refused (pt reported he had just changed shirt prior to evaluation) What is the patient wearing?: Pull over shirt    Upper body assist Assist Level: Set up assist    Lower Body Dressing/Undressing Lower body dressing      What is the patient wearing?: Pants, Underwear/pull up     Lower body assist Assist for lower body dressing: Contact  Guard/Touching assist     Toileting Toileting Toileting Activity did not occur (Clothing management and hygiene only): Refused  Toileting assist Assist for toileting: Dependent - Patient 0%     Transfers Chair/bed transfer  Transfers assist  Chair/bed transfer activity did not occur: Refused  Chair/bed transfer assist level: Contact Guard/Touching assist     Locomotion Ambulation   Ambulation assist   Ambulation activity did not occur: Safety/medical concerns (Fatigue, pain)  Assist level: Contact Guard/Touching assist Assistive device: Walker-rolling Max distance: 233'   Walk 10 feet activity   Assist  Walk 10 feet activity did not occur: Safety/medical concerns (Fatigue, pain)  Assist level: Contact Guard/Touching assist Assistive device: Walker-rolling   Walk 50 feet activity   Assist Walk 50 feet with 2 turns activity did not occur: Safety/medical concerns (Fatigue, pain)  Assist level: Contact Guard/Touching assist Assistive device: Walker-rolling    Walk 150 feet activity   Assist Walk 150 feet activity did not occur: Safety/medical concerns  Assist level: Contact Guard/Touching assist Assistive device: Walker-rolling    Walk 10 feet on uneven surface  activity   Assist Walk 10 feet on uneven surfaces activity did not occur: Safety/medical concerns (Fatigue, pain)         Wheelchair     Assist Is the patient using a wheelchair?: Yes Type of Wheelchair: Manual    Wheelchair assist level: Dependent - Patient 0%      Wheelchair 50 feet with 2 turns activity    Assist        Assist Level: Dependent - Patient 0%   Wheelchair 150 feet activity     Assist      Assist Level: Dependent - Patient 0%   Blood pressure 124/76, pulse 73, temperature 97.7 F (36.5 C), resp. rate 18, height 5\' 11"  (1.803 m), weight (!) 138.3 kg, SpO2 97 %.    Medical Problem List and Plan: 1. 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 IVIG x 5 days completed.             -patient may shower             -ELOS/Goals: minA 14-18 days           - D/c 05/09/21  Con't CIR- PT and OT- great response to steroid injections of R shoulder- doesn't appear will need to extend his rehab stay-  Thrombotics: -DVT/anticoagulation:  Pharmaceutical: Lovenox             -antiplatelet therapy: none 3. Left thumb pain: Ice 15 minutes TID. Tylenol. Spreading to right hand as well- appears to be his rheumatoid arthritis.  Added voltaren gel. Increase prednisone to . Consider consulting his rheumatologist regarding potential treatment options  1/23- L/m for pt's Rheum about his prednisone/MTX- refused MTX last week when ordered, of note. 1/24- changed MTX to start today 17.5 mg qweek; got OK from Rheum; also increased Prednisone to his home dose of 10 mg daily.   1/28- pain under reasonable control at present 1/30- will add tramadol 50 mg BID prn for back pain- has chronic pain AND bed is really hurting his back. 1/31- forgot to ask for tramadol- con't regimen and monitor- asked him to just ask for pain meds prn.  2/1- took tramadol this AM- hadn't kicked in yet- will con't tramadol but increase to q6 hours prn   2/2- tramadol didn't help- will change to norco and d/c tramadol since sedating but not helpful- will do steroid injection of R shoulder today per pt request.  2/3- pain MUCH better- hasn't gotten Norco since late last night- con't regimen-  4. Mood: LCSW to evlauate and provide emotional support             -antipsychotic agents: n/a 5. Neuropsych: This patient is capable of making decisions on his own behalf. 6. Skin/Wound Care: Routine skin checks 7. Fluids/Electrolytes/Nutrition: Routine ins and outs and follow-up chemistries 8.  Peripheral polyneuropathy: Continue gabapentin 1/30- explained this is why had col  feeling of feet- but wondering how GBS plays a role in changing sensation as well.  9.  Obstructive sleep apnea: Continue CPAP at night time and while napping 10: Morbid obesity: Discussed benefits of Mediterranean diet   1/24- BMI 41.82- con't reduction in calories  1/31- BMI up to 42.52- is stable to slightly up.  11: Restless leg syndrome: Continue Mirapex  1/24- will change to 2pm and 7pm per his home dose  2/1- working much better- con't regimen 12: Benign prostatic hypertrophy: Continue Flomax 13: Muscle spasm: Robaxin prn (was getting Valium on acute side)  1/23- increased robaxin to 2x/day at 3pm and 9pm per pt request and BID prn increased to 1000 mg- max 4x/day.  2/3- spasms doing grea-t con't regimen 14: Vitamin B12 deficiency: continue oral supplement  15: RA- Arthritis flare both hands: started on prednisone then weaned off; methotrexate home med held  1/24- restarted MTX q week today and increased prednisone to home dose 10 mg daily.   1/31- still having AM stiffness, but better. Con't regimen 16: Overactive bladder: continue Vibegron 17: Loose stools versus constipation: continue to monitor>>Imodium PRN/anti-constipation measures. Added prn Dulxolax.   2/3- going regularly- con't regimen 18: History of Vitamin D deficiency: check serum level  1/24- Vit D level 44.45 19: Mild thrombocytopenia: follow-up CBC  1/23- plts down slightly to 136k- will recheck in AM  1/31- Plts up to 240k- con't to monitor 20. Left knee pain: ice 15 minutes TID. Messaged ortho PA to see if he can get knee aspiration again but pain has improved- can likely defer to later in the week, discussed with ortho PA.   1/23- has already d/w ortho- they don't want to take more fluid off L knee right now-   2/3- pain controlled- swelling better- con't regimen 21. Low back pain, chronic: add kpad. 22. Cold feet/neuropathy  1/28 discussed with patient that circulation appeared to be adequate in both feet.  "Cold" sensation is more related to his neuropathy.Mild color difference might be related to neurological factors, positioning, etc.   22. Leukopenia  1/27- Down to 3.7- not sure cause- just under  normal- will recheck Monday and could be due to prednisone?  1/30- back to 4.2- will monitor weekly.  23. R shoulder pain/RTC inflammation?  2/1- spoke with Dr Lajoyce Corners- we agreed that MRI would be needed if thought infectious, but don't see a reason for this- will try increasing his prednisone to 20 mg daily x 4 days and also increase tramadol to q6 hours prn for pain- and see if that helps. Wait to do injection for now.   2/2- pain hasn't improved too much- even though icing and using heat as well- will do steroid injection of R RTC/and R subacromial steroid injection  2/3- will change Prednisone to 10 mg daily as of tomorrow, since had such a good response to steroid injections- so won't go until Sunday like had planned  Procedure note:05/03/21 steroid injection was performed at bR posterior shoulder/RTC and R subacromial using 1% plain Lidocaine and  /1cc of Kenalog for each injection. This was well tolerated.  Cleaned with betadine x3 and allowed to dry- then alcohol then injected using 27 gauge 1.5 inch needle- no bleeding or complications.    F/U in 3 months for steroid injections of shoulders/bsubacromial  tendons.  Lidocaine will kick in 15 minutes- and wear off tonight- the steroid will kick in tomorrow within 24 hours and take up to 72 hours to fully kick in.      LOS: 14 days A FACE TO FACE EVALUATION WAS PERFORMED  Tony Mcintosh 05/04/2021, 8:24 AM

## 2021-05-04 NOTE — Progress Notes (Addendum)
Physical Therapy Session Note  Patient Details  Name: Tony Mcintosh MRN: [id number] Date of Birth: 1946-01-14  Today's Date: 05/04/2021 PT Individual Time: [id number] PT Individual Time Calculation (min): 44 min   Short Term Goals: Week 2:  PT Short Term Goal 1 (Week 2): =LTG due to ELOS  Skilled Therapeutic Interventions/Progress Updates: Pt presents sitting in w/c and eager for therapy.  Pt states R shoulder feels great after injections yesterday.  Pt wheeled to main gym for energy conservation.  Pt transfers sit to stand w/ CGA and RW.  Pt amb multiple trials w/ RW and CGA 110 x 2, 140' w/ 1 standing rest breaks on last trial 2/2 fatigue.  Pt performed standing toe taps to 3 1/2" platform, verbal cues for posture and occasionally for L knee weight acceptance.  Pt amb > 150' w/ RW and CGA w 1 standing rest break.  Pt remained sitting in w/c w/ all needs in reach.  Pt has personal w/c in room for use.     Therapy Documentation Precautions:  Precautions Precautions: Fall Precaution Comments: L knee buckles Restrictions Weight Bearing Restrictions: No General:   Vital Signs: Therapy Vitals Temp: 98.8 F (37.1 C) Temp Source: Oral Pulse Rate: 78 Resp: 16 BP: (!) 141/71 Patient Position (if appropriate): Sitting Oxygen Therapy SpO2: 97 % O2 Device: Room Air Pain:0/10, slight ache in R shoulder       Therapy/Group: Individual Therapy  [name] 05/04/2021, 3:27 PM

## 2021-05-04 NOTE — Progress Notes (Signed)
Occupational Therapy Weekly Progress Note  Patient Details  Name: Tony Mcintosh MRN: [id number] Date of Birth: 29-Jun-1945  Beginning of progress report period: April 27, 2021 End of progress report period: May 04, 2021  Patient has met 3 of 3 short term goals.  Pt made steady progress with BADLs and functional transfers during the past week. LTG upgraded due to continued success with bathing/dressing and transfers. Pt did experience sudden Rt shoulder pain and functional limitations but expect pt to achieve current LTGs. Functional tranfsers with CGA. Pt completes bathing and LB dressing tasks with CGA. UB dressing with supervision. Standing balance with CGA. Family has not been present for therapy sessions.   Patient continues to demonstrate the following deficits: muscle weakness, decreased cardiorespiratoy endurance, impaired timing and sequencing, unbalanced muscle activation, and decreased coordination, and decreased standing balance and decreased balance strategies and therefore will continue to benefit from skilled OT intervention to enhance overall performance with BADL.  Patient progressing toward long term goals..  Continue plan of care.  OT Short Term Goals Week 2:  OT Short Term Goal 1 (Week 2): Pt will complete toileting tasks with mod A OT Short Term Goal 1 - Progress (Week 2): Met OT Short Term Goal 2 (Week 2): Pt will complete bathing at shower level with min A OT Short Term Goal 2 - Progress (Week 2): Met OT Short Term Goal 3 (Week 2): Pt will maintain standing balance with CGA when pulling pants over hips OT Short Term Goal 3 - Progress (Week 2): Met Week 3:  OT Short Term Goal 1 (Week 3): STG=LTG 2/2 ELOS (continue to work towards supervision/min A goals)   Leroy Libman 05/04/2021, 6:31 AM
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
## 2021-05-04 NOTE — Progress Notes (Signed)
Physical Therapy Session Note  Patient Details  Name: Tony Mcintosh MRN: [id number] Date of Birth: 01-29-1946  Today's Date: 05/04/2021 PT Individual Time: 0850-0945 PT Individual Time Calculation (min): 55 min   Short Term Goals: Week 2:  PT Short Term Goal 1 (Week 2): =LTG due to ELOS  Skilled Therapeutic Interventions/Progress Updates: Pt presented in w/c agreeable to therapy. Pt states shoulder improving today. Pt able to lift arm to 90 degrees in mostly pain free range. Pt transported to rehab gym for energy conservation. Participated in gait training performing loop around therapy gym. Pt noted to have improved posture and cadence this session as compared to yesterday. Pt participated in several bouts of horseshoes with RW for dynamics reaching. Pt was able to alternate hands and was able to reach to shoulder height with RUE. PTA then placed red wedge under B feet for hamstring/gastroc stretch while participating in 2 additional bouts of horseshoes. Pt did indicated some discomfort in back after standing on wedge which was relieved with seated rest. Pt then ambulated to stairs and ascended/descended x 8 3in steps with B rails and step to progressing to step through pattern. Pt did descend with step to pattern as noted increased L knee instability. Pt admittedly indicated more difficult that anticipated however please that he was able to complete. Pt then transferred to day room and performed ambulatory transfer to Cybed Kinetron. Pt participated in Kinetron 50cm/sec in seated position for x 1 min however indicated increased pain in RIGHT knee therefore discontinued. Participated in ambulation for endurance through day room and around nsg station ~180f with RW and CGA. Pt with x 1 occurrence of R foot catching with pt indicating continues to have some R knee discomfort from kinetron.After seated rest pt was able to propel w/c ~47fwith supervision and no significant increase in pain. Pt  transported remaining distance back to room and remained in w/c. Pt left with call bell within reach and needs met.       Therapy Documentation Precautions:  Precautions Precautions: Fall Precaution Comments: L knee buckles Restrictions Weight Bearing Restrictions: No General:   Vital Signs:   Pain: Pain Assessment Pain Score: 4  Pain Location: Shoulder Pain Orientation: Right Pain Descriptors / Indicators: Aching;Pressure;Crushing;Restless;Discomfort Pain Onset: On-going Patients Stated Pain Goal: 1 Pain Intervention(s): Medication (See eMAR);Repositioned;Pain med given for lower pain score than stated, per patient request;Relaxation Mobility:   Locomotion :    Trunk/Postural Assessment :    Balance:   Exercises:   Other Treatments:      Therapy/Group: Individual Therapy  Ambyr Qadri 05/04/2021, 12:56 PM

## 2021-05-05 NOTE — Progress Notes (Signed)
PROGRESS NOTE   Subjective/Complaints: Shoulder pain continues to be much improved after steroid injection Playing cards with his wife Discussed his going home next week, he is very excited Discussed restarting methotrexate and potential infection risk  ROS:   Pt denies SOB, abd pain, CP, N/V/C/D, and vision changes, shoulder pain much improved   Objective:   No results found. No results for input(s): WBC, HGB, HCT, PLT in the last 72 hours.   Recent Labs    05/03/21 0522  NA 132*  K 3.9  CL 97*  CO2 26  GLUCOSE 107*  BUN 13  CREATININE 0.90  CALCIUM 9.1     Intake/Output Summary (Last 24 hours) at 05/05/2021 1756 Last data filed at 05/05/2021 1300 Gross per 24 hour  Intake 649 ml  Output 450 ml  Net 199 ml        Physical Exam: Vital Signs Blood pressure 119/63, pulse 70, temperature 97.8 F (36.6 C), resp. rate 20, height 5\' 11"  (1.803 m), weight (!) 138.3 kg, SpO2 98 %. Gen: no distress, normal appearing HEENT: oral mucosa pink and moist, NCAT Cardio: Reg rate Chest: normal effort, normal rate of breathing Abd: soft, non-distended Ext: no edema Psych: pleasant, normal affect Musculoskeletal:  R shoulder much less TTP as well as able to lift R arm above head with flexion and abduction.  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     Cervical back: Rigidity present. 5/5 strength with the exception of LLE 4/5- greatly improved as knee swelling is notable- moderate esp suprapatellar, however nothing to compare it to. Left leg with less muscle girth than right side Neurological:     Mental Status: He is alert and oriented to person,  place, and time.  Skin/ext::right foot with a little more blood pooling than left. Both feet warm to touch with norma cap refill   Assessment/Plan: 1. Functional deficits which require 3+ hours per day of interdisciplinary therapy in a comprehensive inpatient rehab setting. Physiatrist is providing close team supervision and 24 hour management of active medical problems listed below. Physiatrist and rehab team continue to assess barriers to discharge/monitor patient progress toward functional and medical goals  Care Tool:  Bathing  Bathing activity did not occur: Refused Body parts bathed by patient: Right arm, Left arm, Chest, Abdomen, Front perineal area, Right upper leg, Left upper leg, Right lower leg, Face, Left lower leg, Buttocks   Body parts bathed by helper: Buttocks     Bathing assist Assist Level: Contact Guard/Touching assist     Upper Body Dressing/Undressing Upper body dressing Upper body dressing/undressing activity did not occur (including orthotics): Refused (pt reported he had just changed shirt prior to evaluation) What is the patient wearing?: Pull over shirt    Upper body assist Assist Level: Independent    Lower Body Dressing/Undressing Lower body dressing      What is the  patient wearing?: Pants, Underwear/pull up     Lower body assist Assist for lower body dressing: Contact Guard/Touching assist     Toileting Toileting Toileting Activity did not occur (Clothing management and hygiene only): Refused  Toileting assist Assist for toileting: Dependent - Patient 0%     Transfers Chair/bed transfer  Transfers assist  Chair/bed transfer activity did not occur: Refused  Chair/bed transfer assist level: Supervision/Verbal cueing     Locomotion Ambulation   Ambulation assist   Ambulation activity did not occur: Safety/medical concerns (Fatigue, pain)  Assist level: Supervision/Verbal cueing Assistive device: Walker-rolling Max distance: 165    Walk 10 feet activity   Assist  Walk 10 feet activity did not occur: Safety/medical concerns (Fatigue, pain)  Assist level: Supervision/Verbal cueing Assistive device: Walker-rolling   Walk 50 feet activity   Assist Walk 50 feet with 2 turns activity did not occur: Safety/medical concerns (Fatigue, pain)  Assist level: Supervision/Verbal cueing Assistive device: Walker-rolling    Walk 150 feet activity   Assist Walk 150 feet activity did not occur: Safety/medical concerns  Assist level: Supervision/Verbal cueing Assistive device: Walker-rolling    Walk 10 feet on uneven surface  activity   Assist Walk 10 feet on uneven surfaces activity did not occur: Safety/medical concerns (Fatigue, pain)         Wheelchair     Assist Is the patient using a wheelchair?: Yes Type of Wheelchair: Manual    Wheelchair assist level: Supervision/Verbal cueing Max wheelchair distance: 30    Wheelchair 50 feet with 2 turns activity    Assist        Assist Level: Supervision/Verbal cueing   Wheelchair 150 feet activity     Assist      Assist Level: Dependent - Patient 0%   Blood pressure 119/63, pulse 70, temperature 97.8 F (36.6 C), resp. rate 20, height 5\' 11"  (1.803 m), weight (!) 138.3 kg, SpO2 98 %.    Medical Problem List and Plan: 1. 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 IVIG x 5 days completed.             -patient may shower             -ELOS/Goals: minA 14-18 days           - D/c 05/09/21  Continue CIR- PT and OT- great response to steroid injections of R shoulder- doesn't appear will need to extend his rehab stay-  Thrombotics: -DVT/anticoagulation:  Pharmaceutical: Lovenox             -antiplatelet therapy: none 3. Left thumb pain: Ice 15 minutes TID. Tylenol. Spreading to right hand as well- appears to be his  rheumatoid arthritis. Added voltaren gel. Increase prednisone to 5mg . Consider consulting his rheumatologist regarding potential treatment options  1/23- L/m for pt's Rheum about his prednisone/MTX- refused MTX last week when ordered, of note. 1/24- changed MTX to start today 17.5 mg qweek; got OK from Rheum; also increased Prednisone to his home dose of 10 mg daily.   1/28- pain under reasonable control at present 1/30- will add tramadol 50 mg BID prn for back pain- has chronic pain AND bed is really hurting his back. 1/31- forgot to ask for tramadol- con't regimen and monitor- asked him to just ask for pain meds prn.  2/1- took tramadol this AM- hadn't kicked in yet- will con't tramadol but increase to q6 hours  prn   2/2- tramadol didn't help- will change to norco and d/c tramadol since sedating but not helpful- will do steroid injection of R shoulder today per pt request.  2/3- pain MUCH better- hasn't gotten Norco since late last night- con't regimen-  4. Mood: LCSW to evlauate and provide emotional support             -antipsychotic agents: n/a 5. Neuropsych: This patient is capable of making decisions on his own behalf. 6. Skin/Wound Care: Routine skin checks 7. Fluids/Electrolytes/Nutrition: Routine ins and outs and follow-up chemistries 8.  Peripheral polyneuropathy: Continue gabapentin 1/30- explained this is why had col feeling of feet- but wondering how GBS plays a role in changing sensation as well.  9.  Obstructive sleep apnea: Continue CPAP at night time and while napping 10: Morbid obesity: Discussed benefits of Mediterranean diet   1/24- BMI 41.82- con't reduction in calories  1/31- BMI up to 42.52- is stable to slightly up.  11: Restless leg syndrome: Continue Mirapex  1/24- will change to 2pm and 7pm per his home dose  2/1- working much better- con't regimen 12: Benign prostatic hypertrophy: Continue Flomax 13: Muscle spasm: Robaxin prn (was getting Valium on acute  side)  1/23- increased robaxin to 2x/day at 3pm and 9pm per pt request and BID prn increased to 1000 mg- max 4x/day.  2/3- spasms doing grea-t con't regimen 14: Vitamin B12 deficiency: continue oral supplement  15: RA- Arthritis flare both hands: started on prednisone then weaned off; methotrexate home med held  1/24- restarted MTX q week today and increased prednisone to home dose 10 mg daily.   1/31- still having AM stiffness, but better. Con't regimen 16: Overactive bladder: continue Vibegron 17: Loose stools versus constipation: continue to monitor>>Imodium PRN/anti-constipation measures. Added prn Dulxolax.   2/3- going regularly- con't regimen 18: History of Vitamin D deficiency: check serum level  1/24- Vit D level 44.45 19: Mild thrombocytopenia: follow-up CBC  1/23- plts down slightly to 136k- will recheck in AM  1/31- Plts up to 240k- con't to monitor 20. Left knee pain: ice 15 minutes TID. Messaged ortho PA to see if he can get knee aspiration again but pain has improved- can likely defer to later in the week, discussed with ortho PA.   1/23- has already d/w ortho- they don't want to take more fluid off L knee right now-   2/3-4: pain controlled- swelling better- continue regimen 21. Low back pain, chronic: continue kpad. 22. Cold feet/neuropathy  1/28 discussed with patient that circulation appeared to be adequate in both feet. "Cold" sensation is more related to his neuropathy.Mild color difference might be related to neurological factors, positioning, etc.   22. Leukopenia  1/27- Down to 3.7- not sure cause- just under normal- will recheck Monday and could be due to prednisone?  1/30- back to 4.2- will monitor weekly.  23. R shoulder pain/RTC inflammation?  2/1- spoke with Dr Sharol Given- we agreed that MRI would be needed if thought infectious, but don't see a reason for this- will try increasing his prednisone to 20 mg daily x 4 days and also increase tramadol to q6 hours prn for  pain- and see if that helps. Wait to do injection for now.   2/2- pain hasn't improved too much- even though icing and using heat as well- will do steroid injection of R RTC/and R subacromial steroid injection  2/3- will change Prednisone to 10 mg daily as of tomorrow, since had such a  good response to steroid injections- so won't go until Sunday like had planned  2/4: continues to have good response, continue to ice.         LOS: 15 days A FACE TO FACE EVALUATION WAS PERFORMED  Tony Mcintosh 05/05/2021, 5:56 PM

## 2021-05-05 NOTE — Progress Notes (Signed)
Pt has home device and places self on when ready.  RT refilled water chamber at bedside.  RT will cont to monitor.

## 2021-05-05 NOTE — Progress Notes (Addendum)
Physical Therapy Session Note  Patient Details  Name: Tony Mcintosh MRN: [id number] Date of Birth: June 26, 1945  Today's Date: 05/05/2021 PT Individual Time: 0905 - 1005, 60 min     Short Term Goals- :LTGs/    Skilled Therapeutic Interventions/Progress Updates:  Pt resting in wc; he denied pain.    Sit> stand with close supervision.  Sit>< stand without use of UEs to fully load bil LEs x 4 within session.    Gait training with RW over level tile, close supervision, with turns,x 165', x 2 within session. Up/down (8) 3" steps bil rails , CGA, leading with R foot ascending, step-to, and leading with L foot descending backwards, step to.  No L knee buckling.  neuromuscular re-education via demo, forced use, and multimodal cues for seated: bil adductor squeezes against blue [place], x 20.  Seated reciprocal scooting forward/backward for pelvic dissociation, without use of UEs, x 10, bil scapular retraction x 15.    Wc propulsion using bil LEs only for activation of bil hamstrings, x 25' , x 30' with supervision.   At end of session, pt seated in wc with seat pad alarm set and need at hand.  Wife present.     Therapy Documentation Precautions:  Precautions Precautions: Fall Precaution Comments: L knee buckles Restrictions Weight Bearing Restrictions: No      Therapy/Group: Individual Therapy  Dariane Natzke 05/05/2021, 7:55 AM

## 2021-05-06 NOTE — Progress Notes (Signed)
Physical Therapy Session Note  Patient Details  Name: Tony Mcintosh MRN: [id number] Date of Birth: 1945-08-18  Today's Date: 05/06/2021 PT Individual Time: 0800-0900 PT Individual Time Calculation (min): 60 min   Short Term Goals: Week 2:  PT Short Term Goal 1 (Week 2): =LTG due to ELOS  Skilled Therapeutic Interventions/Progress Updates:    Pt received seated in w/c in room, agreeable to PT session. No complaints of pain at beginning of session, does have onset of some L knee soreness following therapy. Sit to stand and transfers with RW at Supervision level during session. Ambulation up to 180 ft with RW at Supervision level during session, cues for upright posture due to trunk flexion and leaning on RW for support. Trial gait with no AD x 60 ft with intermittent use of handrail and min A for balance. Pt exhibits decreased gait speed and smoothness of LE movements without use of AD as well as significant increase in fatigue. Pt reports increased awareness of numbness in BLE with gait without AD and realizes how reliant he has been on UE support during standing and gait. Recommend continued use of RW upon d/c home. Sidesteps L/R 3 x 15 ft with RW and close Supervision for balance, focus on upright standing posture.Static standing balance performing ball toss, 2 x 30 reps to fatigue with no UE support and Supervision for balance. Pt left seated in w/c in room with needs in reach at end of session.  Therapy Documentation Precautions:  Precautions Precautions: Fall Precaution Comments: L knee buckles Restrictions Weight Bearing Restrictions: No      Therapy/Group: Individual Therapy   Excell Seltzer, PT, DPT, CSRS  05/06/2021, 11:06 AM

## 2021-05-06 NOTE — Progress Notes (Signed)
Pt has home CPAP at bedside and states he does not need any help with it and will place himself on when ready.

## 2021-05-07 LAB — COMPREHENSIVE METABOLIC PANEL
ALT: 50 U/L — ABNORMAL HIGH (ref 0–44)
AST: 27 U/L (ref 15–41)
Albumin: 2.9 g/dL — ABNORMAL LOW (ref 3.5–5.0)
Alkaline Phosphatase: 51 U/L (ref 38–126)
Anion gap: 8 (ref 5–15)
BUN: 15 mg/dL (ref 8–23)
CO2: 26 mmol/L (ref 22–32)
Calcium: 8.8 mg/dL — ABNORMAL LOW (ref 8.9–10.3)
Chloride: 101 mmol/L (ref 98–111)
Creatinine, Ser: 0.99 mg/dL (ref 0.61–1.24)
GFR, Estimated: >60 mL/min (ref >60)
Glucose, Bld: 106 mg/dL — ABNORMAL HIGH (ref 70–99)
Potassium: 4 mmol/L (ref 3.5–5.1)
Sodium: 135 mmol/L (ref 135–145)
Total Bilirubin: 0.4 mg/dL (ref 0.3–1.2)
Total Protein: 7.7 g/dL (ref 6.5–8.1)

## 2021-05-07 LAB — CBC WITH DIFFERENTIAL/PLATELET
Abs Immature Granulocytes: 0.06 10*3/uL (ref 0.00–0.07)
Basophils Absolute: 0 10*3/uL (ref 0.0–0.1)
Basophils Relative: 0 %
Eosinophils Absolute: 0 10*3/uL (ref 0.0–0.5)
Eosinophils Relative: 0 %
HCT: 38.2 % — ABNORMAL LOW (ref 39.0–52.0)
Hemoglobin: 13 g/dL (ref 13.0–17.0)
Immature Granulocytes: 1 %
Lymphocytes Relative: 22 %
Lymphs Abs: 1.3 10*3/uL (ref 0.7–4.0)
MCH: 31.9 pg (ref 26.0–34.0)
MCHC: 34 g/dL (ref 30.0–36.0)
MCV: 93.9 fL (ref 80.0–100.0)
Monocytes Absolute: 0.5 10*3/uL (ref 0.1–1.0)
Monocytes Relative: 9 %
Neutro Abs: 4.1 10*3/uL (ref 1.7–7.7)
Neutrophils Relative %: 68 %
Platelets: 284 10*3/uL (ref 150–400)
RBC: 4.07 MIL/uL — ABNORMAL LOW (ref 4.22–5.81)
RDW: 14.1 % (ref 11.5–15.5)
WBC: 6.1 10*3/uL (ref 4.0–10.5)
nRBC: 0 % (ref 0.0–0.2)

## 2021-05-07 MED ORDER — ENOXAPARIN SODIUM 80 MG/0.8ML IJ SOSY
70.0000 mg | PREFILLED_SYRINGE | INTRAMUSCULAR | Status: DC
  Administered 2021-05-07 – 2021-05-08 (×2): 70 mg via SUBCUTANEOUS
  Filled 2021-05-07 (×2): qty 0.7

## 2021-05-07 NOTE — Progress Notes (Signed)
Occupational Therapy Session Note  Patient Details  Name: Tony Mcintosh MRN: [id number] Date of Birth: [birth year]  Today's Date: 05/07/2021 OT Individual Time: 1300-1355 OT Individual Time Calculation (min): 55 min    Short Term Goals: Week 3:  OT Short Term Goal 1 (Week 3): STG=LTG 2/2 ELOS (continue to work towards supervision/min A goals)  Skilled Therapeutic Interventions/Progress Updates:    Pt resting in w/c upon arrival. Bil leg rest adjusted and pt propelled w/c into hallway. OT intervention with focus on w/c mobility (supervision for short distances<25'), functional amb in community setting, sit<>stand from lower surfaces (benches), and ongoing discharge planning. Pt amb with RW in courtyard outside of [facility] (75' and 25'x2). Discussed safety in community setting and education on increasing awareness of uneven surfaces and "hidden" obstructions/obstacles. Pt returned to room and remained seated in w/c. Ice applied to Lt knee.   Therapy Documentation Precautions:  Precautions Precautions: Fall Precaution Comments: L knee buckles Restrictions Weight Bearing Restrictions: No   Pain:  Pt reports he feels a "little twinge" in his Lt knee with activity; ice applied ADL: ADL Eating: Not assessed Grooming: Not assessed Upper Body Bathing: Not assessed Lower Body Bathing: Not assessed Upper Body Dressing: Not assessed Lower Body Dressing: Maximal assistance Where Assessed-Lower Body Dressing: Edge of bed, Other (Comment) Antony Salmon) Toileting: Not assessed Toilet Transfer Method: Not assessed Tub/Shower Transfer: Not assessed Film/video editor: Not assessed   Therapy/Group: Individual Therapy  Rich Brave 05/07/2021, 1:58 PM

## 2021-05-07 NOTE — Progress Notes (Signed)
Physical Therapy Session Note  Patient Details  Name: Tony Mcintosh MRN: [id number] Date of Birth: 06/07/1945  Today's Date: 05/07/2021 PT Individual Time: 1030-1056 PT Individual Time Calculation (min): 26 min   Short Term Goals: Week 1:  PT Short Term Goal 1 (Week 1): Pt will initiate gait training w/LRAD and max A PT Short Term Goal 1 - Progress (Week 1): Met PT Short Term Goal 2 (Week 1): Pt will perform sit <>stands w/LRAD and mod A PT Short Term Goal 2 - Progress (Week 1): Met PT Short Term Goal 3 (Week 1): Pt will perform bed <>chair transfers w/LRAD and max A PT Short Term Goal 3 - Progress (Week 1): Met Week 2:  PT Short Term Goal 1 (Week 2): =LTG due to ELOS   Skilled Therapeutic Interventions/Progress Updates:  Pt received seated in WC in room, ice pack on L knee. Pt denied pain and was agreeable to PT, mentioned that his L knee was "sore" following earlier sessions. Emphasis of session on pt education and gait training for BLE strength. Pt performed sit <>stand from [facility] to RW w/S* and ambulated 150' x2 w/S*, short seated rest break taken between gait trials. Noted kyphotic posture w/L shoulder shrug, Decreased step length of RLE, narrow BOS and uncompensated Trendelenburg on R side. Min verbal cues for upright posture and glute activation. Once returned to room, pt had several questions regarding appropriate footwear for insensate feet, which were all addressed. Pt was left seated in WC in room, all needs in reach.  Therapy Documentation Precautions:  Precautions Precautions: Fall Precaution Comments: L knee buckles Restrictions Weight Bearing Restrictions: No   Therapy/Group: Individual Therapy Cruzita Lederer Tony Mcintosh, PT, DPT  05/07/2021, 7:47 AM

## 2021-05-07 NOTE — Progress Notes (Signed)
Occupational Therapy Session Note  Patient Details  Name: Tony Mcintosh MRN: [id number] Date of Birth: 1945-04-11  Today's Date: 05/07/2021 OT Individual Time: 0700-0810 OT Individual Time Calculation (min): 70 min    Short Term Goals: Week 3:  OT Short Term Goal 1 (Week 3): STG=LTG 2/2 ELOS (continue to work towards supervision/min A goals)  Skilled Therapeutic Interventions/Progress Updates:    Pt resting in w/c upon arrival. Pt deferred taking shower until tomorrow. OT intervention with focus on functional amb with RW, standing balance, kitchen safety, and ongoing discharge planning. Pt practiced obtaining items from kitchen cabinets and refrigerator. Pt used kitchen counter vs RW when completing kitchen tasks. Pt also practiced obtaining items from refrigerator and placing on counter by transferring from one hand to the other. Discussed use of small rolling table to transfer items in kitchen. Pt practiced with small rolling table. Amb with RW in hallway at supervision level. Pt returned to room and remained in w/c with all needs within reach.   Therapy Documentation Precautions:  Precautions Precautions: Fall Precaution Comments: L knee buckles Restrictions Weight Bearing Restrictions: No Pain:  Pt reports his Rt shoulder "feels great"   Therapy/Group: Individual Therapy  Rich Brave 05/07/2021, 8:20 AM

## 2021-05-07 NOTE — Progress Notes (Signed)
Patient has home CPAP at bedside. This RT checked chamber for distilled H20 per patient request. Chamber is at max amount at this time. Patient does not need any further assistance for RT at this time. Patient is able to placed himself on/off as needed, also aware to call if assistance needed.

## 2021-05-07 NOTE — Progress Notes (Signed)
PROGRESS NOTE   Subjective/Complaints:  Pt reports trying w/c from home- wife brought in- she's coming in tomorrow for family training- asking if I need to educate her on anything.  Also says R should pain is resolved after injections.  L knee is doing OK.  Numb to the knees still.  LBM this AM.  Stool hard to push out- either hard or pasty.  Dulcolax helps PO.    ROS:   Pt denies SOB, abd pain, CP, N/V/(+) C/D, and vision changes    Objective:   No results found. Recent Labs    05/07/21 0637  WBC 6.1  HGB 13.0  HCT 38.2*  PLT 284     Recent Labs    05/07/21 0637  NA 135  K 4.0  CL 101  CO2 26  GLUCOSE 106*  BUN 15  CREATININE 0.99  CALCIUM 8.8*     Intake/Output Summary (Last 24 hours) at 05/07/2021 1842 Last data filed at 05/07/2021 1610 Gross per 24 hour  Intake 480 ml  Output --  Net 480 ml        Physical Exam: Vital Signs Blood pressure 125/72, pulse 67, temperature 97.8 F (36.6 C), resp. rate 20, height  (1.803 m), weight (!) 138.3 kg, SpO2 96 %.   General: awake, alert, appropriate, sitting up in manual w/c in room; OTA in room; BMI 42+, NAD HENT: conjugate gaze; oropharynx moist CV: regular rate; no JVD Pulmonary: CTA B/L; no W/R/R- good air movement GI: soft, NT, ND, (+)BS- protuberant Psychiatric: appropriate Neurological: Ox3 Musculoskeletal:  R shoulder much less TTP as well as able to lift R arm above head with flexion and abduction.  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     Cervical back: Rigidity present. 5/5 strength with the exception of LLE 4/5- greatly improved as knee swelling is  notable- moderate esp suprapatellar, however nothing to compare it to. Left leg with less muscle girth than right side Neurological:     Mental Status: He is alert and oriented to person, place, and time.  Skin/ext::right foot with a little more blood pooling than left. Both feet warm to touch with norma cap refill   Assessment/Plan: 1. Functional deficits which require 3+ hours per day of interdisciplinary therapy in a comprehensive inpatient rehab setting. Physiatrist is providing close team supervision and 24 hour management of active medical problems listed below. Physiatrist and rehab team continue to assess barriers to discharge/monitor patient progress toward functional and medical goals  Care Tool:  Bathing  Bathing activity did not occur: Refused Body parts bathed by patient: Right arm, Left arm, Chest, Abdomen, Front perineal area, Right upper leg, Left upper leg, Right lower leg, Face, Left lower leg, Buttocks   Body parts bathed by helper: Buttocks     Bathing assist Assist Level: Contact Guard/Touching assist     Upper Body Dressing/Undressing Upper  body dressing Upper body dressing/undressing activity did not occur (including orthotics): Refused (pt reported he had just changed shirt prior to evaluation) What is the patient wearing?: Pull over shirt    Upper body assist Assist Level: Independent    Lower Body Dressing/Undressing Lower body dressing      What is the patient wearing?: Pants, Underwear/pull up     Lower body assist Assist for lower body dressing: Contact Guard/Touching assist     Toileting Toileting Toileting Activity did not occur (Clothing management and hygiene only): Refused  Toileting assist Assist for toileting: Dependent - Patient 0%     Transfers Chair/bed transfer  Transfers assist  Chair/bed transfer activity did not occur: Refused  Chair/bed transfer assist level: Supervision/Verbal cueing      Locomotion Ambulation   Ambulation assist   Ambulation activity did not occur: Safety/medical concerns (Fatigue, pain)  Assist level: Supervision/Verbal cueing Assistive device: Walker-rolling Max distance: 165   Walk 10 feet activity   Assist  Walk 10 feet activity did not occur: Safety/medical concerns (Fatigue, pain)  Assist level: Supervision/Verbal cueing Assistive device: Walker-rolling   Walk 50 feet activity   Assist Walk 50 feet with 2 turns activity did not occur: Safety/medical concerns (Fatigue, pain)  Assist level: Supervision/Verbal cueing Assistive device: Walker-rolling    Walk 150 feet activity   Assist Walk 150 feet activity did not occur: Safety/medical concerns  Assist level: Supervision/Verbal cueing Assistive device: Walker-rolling    Walk 10 feet on uneven surface  activity   Assist Walk 10 feet on uneven surfaces activity did not occur: Safety/medical concerns (Fatigue, pain)         Wheelchair     Assist Is the patient using a wheelchair?: Yes Type of Wheelchair: Manual    Wheelchair assist level: Supervision/Verbal cueing Max wheelchair distance: 30    Wheelchair 50 feet with 2 turns activity    Assist        Assist Level: Supervision/Verbal cueing   Wheelchair 150 feet activity     Assist      Assist Level: Dependent - Patient 0%   Blood pressure 125/72, pulse 67, temperature 97.8 F (36.6 C), resp. rate 20, height 5\' 11"  (1.803 m), weight (!) 138.3 kg, SpO2 96 %.    Medical Problem List and Plan: 1. 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 IVIG x 5 days completed.             -patient may shower             -ELOS/Goals: minA 14-18 days           - D/c 05/09/21  Continue CIR- PT and OT- great response to steroid injections of R shoulder- doesn't appear will need to extend  his rehab stay-   Continue CIR- PT, OT - d/c 2/8 Thrombotics: -DVT/anticoagulation:  Pharmaceutical: Lovenox             -antiplatelet therapy: none 3. Left thumb pain: Ice 15 minutes TID. Tylenol. Spreading to right hand as well- appears to be his rheumatoid arthritis. Added voltaren gel. Increase prednisone to 5mg . Consider consulting his rheumatologist regarding potential treatment options  1/23- L/m for pt's Rheum about his prednisone/MTX- refused MTX last week when ordered, of note. 1/24- changed MTX to start today 17.5 mg qweek; got OK from Rheum; also increased Prednisone to his home dose of 10 mg daily.   1/28-  pain under reasonable control at present 1/30- will add tramadol 50 mg BID prn for back pain- has chronic pain AND bed is really hurting his back. 1/31- forgot to ask for tramadol- con't regimen and monitor- asked him to just ask for pain meds prn.  2/1- took tramadol this AM- hadn't kicked in yet- will con't tramadol but increase to q6 hours prn   2/2- tramadol didn't help- will change to norco and d/c tramadol since sedating but not helpful- will do steroid injection of R shoulder today per pt request.  2/3- pain MUCH better- hasn't gotten Norco since late last night- con't regimen-  2/6- pain MUCH better- con't norco while here prn; but won't need at d/c-  4. Mood: LCSW to evlauate and provide emotional support             -antipsychotic agents: n/a 5. Neuropsych: This patient is capable of making decisions on his own behalf. 6. Skin/Wound Care: Routine skin checks 7. Fluids/Electrolytes/Nutrition: Routine ins and outs and follow-up chemistries 8.  Peripheral polyneuropathy: Continue gabapentin 1/30- explained this is why had col feeling of feet- but wondering how GBS plays a role in changing sensation as well. 2/6- still numb from feet to knees B/L   9.  Obstructive sleep apnea: Continue CPAP at night time and while napping 10: Morbid obesity: Discussed benefits of  Mediterranean diet   1/24- BMI 41.82- con't reduction in calories  1/31- BMI up to 42.52- is stable to slightly up.  11: Restless leg syndrome: Continue Mirapex  1/24- will change to 2pm and 7pm per his home dose  2/1- working much better- con't regimen 12: Benign prostatic hypertrophy: Continue Flomax 13: Muscle spasm: Robaxin prn (was getting Valium on acute side)  1/23- increased robaxin to 2x/day at 3pm and 9pm per pt request and BID prn increased to 1000 mg- max 4x/day.  2/3- spasms doing grea-t con't regimen 14: Vitamin B12 deficiency: continue oral supplement  15: RA- Arthritis flare both hands: started on prednisone then weaned off; methotrexate home med held  1/24- restarted MTX q week today and increased prednisone to home dose 10 mg daily.   1/31- still having AM stiffness, but better. Con't regimen  2/6- stiffness stable- con't regimen 16: Overactive bladder: continue Vibegron 17: Loose stools versus constipation: continue to monitor>>Imodium PRN/anti-constipation measures. Added prn Dulxolax.   2/3- going regularly- con't regimen 18: History of Vitamin D deficiency: check serum level  1/24- Vit D level 44.45 19: Mild thrombocytopenia: follow-up CBC  1/23- plts down slightly to 136k- will recheck in AM  1/31- Plts up to 240k- con't to monitor 20. Left knee pain: ice 15 minutes TID. Messaged ortho PA to see if he can get knee aspiration again but pain has improved- can likely defer to later in the week, discussed with ortho PA.   1/23- has already d/w ortho- they don't want to take more fluid off L knee right now-   2/3-4: pain controlled- swelling better- continue regimen 21. Low back pain, chronic: continue kpad. 22. Cold feet/neuropathy  1/28 discussed with patient that circulation appeared to be adequate in both feet. "Cold" sensation is more related to his neuropathy.Mild color difference might be related to neurological factors, positioning, etc.   22. Leukopenia  1/27-  Down to 3.7- not sure cause- just under normal- will recheck Monday and could be due to prednisone?  1/30- back to 4.2- will monitor weekly.   2/6- WBC 6.1- stable 23. R shoulder pain/RTC inflammation?  2/1- spoke with Dr Lajoyce Corners- we agreed that MRI would be needed if thought infectious, but don't see a reason for this- will try increasing his prednisone to 20 mg daily x 4 days and also increase tramadol to q6 hours prn for pain- and see if that helps. Wait to do injection for now.   2/2- pain hasn't improved too much- even though icing and using heat as well- will do steroid injection of R RTC/and R subacromial steroid injection  2/3- will change Prednisone to 10 mg daily as of tomorrow, since had such a good response to steroid injections- so won't go until Sunday like had planned  2/4: continues to have good response, continue to ice.      I spent a total of 35   minutes on total care today- >50% coordination of care- due to prolonged d/w pt about d/c and OTA.     LOS: 17 days A FACE TO FACE EVALUATION WAS PERFORMED  Reg Bircher 05/07/2021, 6:42 PM

## 2021-05-07 NOTE — Progress Notes (Signed)
Physical Therapy Session Note  Patient Details  Name: Tony Mcintosh MRN: [id number] Date of Birth: 09-Nov-1945  Today's Date: 05/07/2021 PT Individual Time: 0915-1015 PT Individual Time Calculation (min): 60 min   Short Term Goals: Week 2:  PT Short Term Goal 1 (Week 2): =LTG due to ELOS  Skilled Therapeutic Interventions/Progress Updates:    Pt received seated in w/c in room, agreeable to PT session. Minimal complaints of pain at beginning of session. Pt does have onset of L knee pain following therapy session, provided ice pack to L knee for pain management. Sit to stand with Supervision to RW during session. Simulation of pt's home environment trialing utilizing table for UE support vs use of RW in pt's woodshop. Pt exhibits decreased overall balance with use of table for support especially when reaching outside BOS for targets. Recommending continued use of RW for mobility and/or having someone else present when navigating his woodshop for safety. Reviewed HEP with patient, see handout below. Pt does have increase in L knee pain during squats that does not subside during further exercises. Pt returned to room and left seated in w/c with needs in reach, ice pack to L knee.  Access Code: [email or web address] URL: [email or web address] Date: 05/07/2021 Prepared by: Peter Congo  Exercises Mini Squat with Counter Support - 1 x daily - 7 x weekly - 2 sets - 10 reps Side Stepping with Counter Support - 1 x daily - 7 x weekly - 3 sets - 10 reps Forward Step Up with Counter Support - 1 x daily - 7 x weekly - 3 sets - 10 reps Step Taps on High Step - 1 x daily - 7 x weekly - 3 sets - 10 reps Sit to Stand with Arms Crossed - 1 x daily - 7 x weekly - 3 sets - 10 reps  Therapy Documentation Precautions:  Precautions Precautions: Fall Precaution Comments: L knee buckles Restrictions Weight Bearing Restrictions: No       Therapy/Group: Individual Therapy   Peter Congo, PT,  DPT, CSRS  05/07/2021, 12:03 PM

## 2021-05-08 NOTE — Progress Notes (Signed)
Physical Therapy Session Note  Patient Details  Name: Tony Mcintosh MRN: [id number] Date of Birth: 1945-06-08  Today's Date: 05/08/2021 PT Individual Time: [id number] PT Individual Time Calculation (min): 45 min   Short Term Goals: Week 2:  PT Short Term Goal 1 (Week 2): =LTG due to ELOS  Skilled Therapeutic Interventions/Progress Updates:    Pt received seated in w/c in room, agreeable to PT session. No complaints of pain. [name] present for hands-on family education session. Pt is at Supervision level for sit to stand transfers to RW during session, Supervision for gait up to 150 ft with use of RW. Pt is able to perform car transfer at Supervision level with use of RW at simulation height of truck that he will be utilizing at home. Pt also able to perform car transfer with min A from simulation sedan height, recommend pt use truck at home for increased independence and safety with car transfers. Ascend/descend 8 x 3" stairs with 2 handrails and CGA for balance, step-to gait pattern. Pt has a lift at home and will not need to navigate stairs upon d/c home. Discussed pt's ongoing fall risk and what to do in the even of a fall. Pt and wife understanding that they should call emergency services should pt experience a fall and not attempt to get back up by themselves. Pt returned to room and left seated in w/c with needs in reach, wife present. Pt is safe to d/c home with wife at this time.  Therapy Documentation Precautions:  Precautions Precautions: Fall Precaution Comments: L knee buckles Restrictions Weight Bearing Restrictions: No     Therapy/Group: Individual Therapy   Excell Seltzer, PT, DPT, CSRS  05/08/2021, 2:57 PM

## 2021-05-08 NOTE — Progress Notes (Signed)
Patient has home CPAP at bedside. No further assistance needed from Respiratory at this time.

## 2021-05-08 NOTE — Progress Notes (Signed)
Occupational Therapy Session Note  Patient Details  Name: Tony Mcintosh MRN: [id number] Date of Birth: 07/16/1945  Today's Date: 05/08/2021 OT Individual Time: 0700-0825 OT Individual Time Calculation (min): 85 min    Short Term Goals: Week 3:  OT Short Term Goal 1 (Week 3): STG=LTG 2/2 ELOS (continue to work towards supervision/min A goals)  Skilled Therapeutic Interventions/Progress Updates:    OT intervention with focus on bathing at shower level, dressing with sit<>stand from w/c, grooming while standing at sink, standing balance, and discharge planning. Bathing/dressing with supervision using AE appropriately. Pt transitioned to day room and engaged in Wii bowling; sit<>stand X 4. Pt stood for 7/10 frames with no LOB. Pt amb with RW in hallway approx 50'. Pt returned to room and remained in w/c with all needs within reach.   Therapy Documentation Precautions:  Precautions Precautions: Fall Precaution Comments: L knee buckles Restrictions Weight Bearing Restrictions: No Pain:  Pt denies pain this morning.   Therapy/Group: Individual Therapy  Rich Brave 05/08/2021, 8:32 AM

## 2021-05-08 NOTE — Progress Notes (Signed)
Physical Therapy Discharge Summary  Patient Details  Name: Tony Mcintosh MRN: [id number] Date of Birth: June 09, 1945  Patient has met 8 of 8 long term goals due to improved activity tolerance, improved balance, improved postural control, increased strength, decreased pain, and ability to compensate for deficits.  Patient to discharge at an ambulatory level Supervision.   Patient's care partner is independent to provide the necessary physical assistance at discharge. [name] has completed family education and is safe to provide Supervision level assist upon d/c home.  Reasons goals not met: Pt has met all rehab goals.  Recommendation:  Patient will benefit from ongoing skilled PT services in home health setting to continue to advance safe functional mobility, address ongoing impairments in endurance, strength, balance, safety, independence with functional mobility, and minimize fall risk.  Equipment: Bariatric RW  Reasons for discharge: treatment goals met and discharge from hospital  Patient/family agrees with progress made and goals achieved: Yes  PT Discharge Precautions/Restrictions Precautions Precautions: Fall Restrictions Weight Bearing Restrictions: No Pain Interference Pain Interference Pain Effect on Sleep: 2. Occasionally Pain Interference with Therapy Activities: 2. Occasionally Pain Interference with Day-to-Day Activities: 2. Occasionally Vision/Perception  Perception Perception: Within Functional Limits Praxis Praxis: Intact  Cognition Overall Cognitive Status: Within Functional Limits for tasks assessed Arousal/Alertness: Awake/alert Orientation Level: Oriented X4 Year: 2023 Attention: Sustained Sustained Attention: Appears intact Memory: Appears intact Awareness: Appears intact Problem Solving: Appears intact Safety/Judgment: Appears intact Sensation Sensation Light Touch: Impaired Detail Light Touch Impaired Details: Impaired LLE;Impaired RLE  (L>R, distal>proximal) Proprioception: Impaired Detail Proprioception Impaired Details: Impaired RLE;Impaired LLE (L>R) Coordination Gross Motor Movements are Fluid and Coordinated: Yes Fine Motor Movements are Fluid and Coordinated: Yes Motor  Motor Motor: Abnormal postural alignment and control Motor - Skilled Clinical Observations: impaired by decreased strength Motor - Discharge Observations: impaired by decreased strength  Mobility Bed Mobility Bed Mobility: Rolling Right;Rolling Left;Supine to Sit;Sit to Supine Rolling Right: Independent with assistive device Rolling Left: Independent with assistive device Supine to Sit: Independent with assistive device Sit to Supine: Independent with assistive device Transfers Transfers: Sit to Stand;Stand Pivot Transfers Sit to Stand: Supervision/Verbal cueing Stand to Sit: Supervision/Verbal cueing Stand Pivot Transfers: Contact Guard/Touching assist Transfer (Assistive device): Rolling walker Locomotion  Gait Ambulation: Yes Gait Assistance: Supervision/Verbal cueing Gait Distance (Feet): 165 Feet Assistive device: Rolling walker Gait Assistance Details: Verbal cues for precautions/safety Gait Gait: Yes Gait Pattern: Impaired Gait Pattern: Decreased step length - right;Decreased step length - left;Decreased dorsiflexion - right;Decreased dorsiflexion - left;Decreased hip/knee flexion - left;Trendelenburg Gait velocity: decreased Stairs / Additional Locomotion Stairs: Yes Stairs Assistance: Contact Guard/Touching assist Stair Management Technique: Two rails;Step to pattern Number of Stairs: 8 Height of Stairs: 3 Pick up small object from the floor assist level: Supervision/Verbal cueing Pick up small object from the floor assistive device: RW and Art gallery manager Mobility: Yes Wheelchair Assistance: Chartered loss adjuster: Both upper extremities;Both lower extermities Wheelchair  Parts Management: Independent Distance: 30  Trunk/Postural Assessment  Cervical Assessment Cervical Assessment: Exceptions to [facility] (forward head) Thoracic Assessment Thoracic Assessment: Exceptions to [facility] (rounded shoulders) Lumbar Assessment Lumbar Assessment:  (posterior pelvic tilt) Postural Control Postural Control: Within Functional Limits  Balance Balance Balance Assessed: Yes Static Sitting Balance Static Sitting - Balance Support: No upper extremity supported;Feet supported Static Sitting - Level of Assistance: 6: Modified independent (Device/Increase time) Dynamic Sitting Balance Dynamic Sitting - Balance Support: No upper extremity supported;Feet supported;During functional activity Dynamic Sitting - Level of Assistance: 6: Modified  independent (Device/Increase time) Static Standing Balance Static Standing - Balance Support: Bilateral upper extremity supported;During functional activity Static Standing - Level of Assistance: 5: Stand by assistance Extremity Assessment   RLE Assessment RLE Assessment: Within Functional Limits General Strength Comments: see below RLE Strength RLE Overall Strength: Within Functional Limits for tasks assessed Right Hip Flexion: 5/5 Right Knee Flexion: 5/5 Right Knee Extension: 5/5 Right Ankle Dorsiflexion: 5/5 LLE Assessment LLE Assessment: Exceptions to [facility] General Strength Comments: impaired, see below LLE Strength LLE Overall Strength: Deficits Left Hip Flexion: 2+/5 Left Knee Flexion: 3+/5 Left Knee Extension: 3-/5 Left Ankle Dorsiflexion: 3-/5     Excell Seltzer, PT, DPT, CSRS 05/08/2021, 5:06 PM

## 2021-05-08 NOTE — Patient Care Conference (Signed)
Inpatient RehabilitationTeam Conference and Plan of Care Update Date: 05/08/2021   Time: 11:08 AM    Patient Name: Tony Mcintosh      Medical Record Number: [id number]  Date of Birth: 1946/03/03 Sex: Male         Room/Bed: 4M04C/4M04C-01 Payor Info: Payor: MEDICARE / Plan: MEDICARE PART A AND B / Product Type: *No Product type* /    Admit Date/Time:  04/20/2021  2:47 PM  Primary Diagnosis:  GBS (Guillain-Barre syndrome) [facility](HCC)  Hospital Problems: Principal Problem:   GBS (Guillain-Barre syndrome) [facility](HCC)    Expected Discharge Date: Expected Discharge Date: 05/09/21  Team Members Present: Physician leading conference: Dr. Genice RougeMegan Lovorn Social Worker Present: Cecile SheererAuria Chamberlain, LCSWA Nurse Present: Kennyth ArnoldStacey Leaha Cuervo, RN PT Present: Peter Congoaylor Turkalo, PT OT Present: Ardis Rowanom Lanier, COTA;Jennifer Katrinka BlazingSmith, OT PPS Coordinator present : Fae PippinMelissa Bowie, SLP     Current Status/Progress Goal Weekly Team Focus  Bowel/Bladder   continent of B/B. LBM 2/6  remain cont. of B/B  Assess Qshift and prn   Swallow/Nutrition/ Hydration             ADL's   bathing-supervision; dressing-supervision with AE; toileting-supervision; tranfsers-supervisoin  upgraded to supervision overall  standing balance, safety awareness, activity tolerance, education, discharge planning   Mobility   Supervision bed mobility, Supervision transfers with RW, Supervision to CGA gait up to 150 ft with RW  upgraded all goals to Supervision and increased gait goal distance  d/c planning, LE strengthening, gait   Communication             Safety/Cognition/ Behavioral Observations            Pain   No c/o pain  Pain </=3/10  Assess Qshift and prn   Skin   Skin intact  Maintain skin integrity  Assess Qshift and prn     Discharge Planning:  Pt will d/c to home with his wife who is the primary caregiver and his wife is unable to provide physical assistsance. [facility] set up the following- [facility] for  HHPT/OT/Aide and DME: bariatric RW/TTB- delivered to  home. [facility] bariatric 3in1 BSC to be delivered tomorrow.  Pt scheduled for [facility] transportation p/u on date of discharge for 10am. Future appointments will require authorization for approval through [facility]/.   Team Discussion: Discharging tomorrow. Will not discharge on pain medication or Lovenox. Right shoulder improving. Follow-up appointments need to be scheduled prior to discharge so VA will provide transportation. BLE edema.   Patient on target to meet rehab goals: yes, at supervision goal level. 150 ft with RW, min/mod assist for stairs. Plan a car transfer today.  *See Care Plan and progress notes for long and short-term goals.   Revisions to Treatment Plan:  Finalizing discharge plans and medications.   Teaching Needs: Family education, medication management, edema management, safety awareness, transfer/gait training, etc.   Current Barriers to Discharge: BLE edema, transportation to appointments  Possible Resolutions to Barriers: Family education Schedule follow-up appointments     Medical Summary Current Status: R shoulder better after steorid injections- BMI 42.5; continent B/B; skin good; LE edema; CPAP at night;  Barriers to Discharge: Decreased family/caregiver support;Weight;Home enviroment access/layout  Barriers to Discharge Comments: d/c tomorrow- needs f/u appt scheduled with Neuro and me- so VA wil pay for transport Possible Resolutions to [employer], [employer] and CompanyBarriers/Weekly Focus: educated on walking so won't go home on lovenox- famiyl education today; upgraded goals last week- at goal level with OT/PT- d/c tomorrow   Continued Need for  Acute Rehabilitation Level of Care: The patient requires daily medical management by a physician with specialized training in physical medicine and rehabilitation for the following reasons: Direction of a multidisciplinary physical rehabilitation program to maximize functional independence : Yes Medical  management of patient stability for increased activity during participation in an intensive rehabilitation regime.: Yes Analysis of laboratory values and/or radiology reports with any subsequent need for medication adjustment and/or medical intervention. : Yes   I attest that I was present, lead the team conference, and concur with the assessment and plan of the team.   Tennis Must 05/08/2021, 2:53 PM

## 2021-05-08 NOTE — Progress Notes (Addendum)
Patient ID: Tony Mcintosh, male   DOB: 09/01/1945, 76 y.o.   MRN: [id number] ° °SW met with pt and pt wife in room to provide updates from team conference, and review discharge plan. Pt is aware once future f/u appointments here are scheduled, a script for transportation service will be sent to the [facility] SW. Pt wife continues to report she has not received the bariatric 3in1 BSC.  ° °SW informed nursing team on transportation arrangement for pt tomorrow.  ° °SW received updates from [facility] SW item was delivered to the home. SW spoke with pt wife and pt and informed for them both to inform SW if item has not been delivered.  ° ° , MSW, LCSWA °Office: [phone] °Cell: [phone] °Fax: [phone]  °

## 2021-05-08 NOTE — Progress Notes (Signed)
Occupational Therapy Session Note  Patient Details  Name: Tony Mcintosh MRN: [id number] Date of Birth: February 24, 1946  Today's Date: 05/08/2021 OT Individual Time: 1300-1400 OT Individual Time Calculation (min): 60 min    Short Term Goals: Week 3:  OT Short Term Goal 1 (Week 3): STG=LTG 2/2 ELOS (continue to work towards supervision/min A goals)  Skilled Therapeutic Interventions/Progress Updates:    Pt resting in w/c upon arrival with wife present for education. Pt demonstrated TTB transfers and practiced simple kitchen activities with RW. Pt also practiced pushing rolling table in kitchen. Amb in hallway with RW at supervision level. Pt and wife pleased with progress and ready for discharge home tomorrow.   Therapy Documentation Precautions:  Precautions Precautions: Fall Precaution Comments: L knee buckles Restrictions Weight Bearing Restrictions: No Pain: Pain Assessment Pain Score: 0-No pain Faces Pain Scale: No hurt   Therapy/Group: Individual Therapy  Rich Brave 05/08/2021, 2:50 PM

## 2021-05-08 NOTE — Progress Notes (Signed)
PROGRESS NOTE   Subjective/Complaints:  Pt asking if going home on Lovenox= LBM in last 24 hours.   Feeling good- got shower and dressed himself.   ROS:   Pt denies SOB, abd pain, CP, N/V/C/D, and vision changes   Objective:   No results found. Recent Labs    05/07/21 0637  WBC 6.1  HGB 13.0  HCT 38.2*  PLT 284     Recent Labs    05/07/21 0637  NA 135  K 4.0  CL 101  CO2 26  GLUCOSE 106*  BUN 15  CREATININE 0.99  CALCIUM 8.8*     Intake/Output Summary (Last 24 hours) at 05/08/2021 4098 Last data filed at 05/08/2021 1191 Gross per 24 hour  Intake 480 ml  Output --  Net 480 ml        Physical Exam: Vital Signs Blood pressure 133/77, pulse 61, temperature 97.7 F (36.5 C), resp. rate 16, height  (1.803 m), weight (!) 138.3 kg, SpO2 98 %.    General: awake, alert, appropriate, bmi 42- SITTING UP IN W/C AT BEDSIDE; ota IN ROOM; NAD HENT: conjugate gaze; oropharynx moist CV: regular rate; no JVD Pulmonary: CTA B/L; no W/R/R- good air movement GI: soft, NT, ND, (+)BS- PROTUBERANT Psychiatric: appropriate Neurological: Ox3 Musculoskeletal:  R shoulder much less TTP as well as able to lift R arm above head with flexion and abduction.  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     Cervical back: Rigidity present. 5/5 strength with the exception of LLE 4/5- greatly improved as knee swelling is notable- moderate esp suprapatellar, however nothing to compare it to. Left leg with less muscle girth than right side Neurological:     Mental Status: He is alert and oriented to person, place, and time.  Skin/ext::right foot with a  little more blood pooling than left. Both feet warm to touch with norma cap refill   Assessment/Plan: 1. Functional deficits which require 3+ hours per day of interdisciplinary therapy in a comprehensive inpatient rehab setting. Physiatrist is providing close team supervision and 24 hour management of active medical problems listed below. Physiatrist and rehab team continue to assess barriers to discharge/monitor patient progress toward functional and medical goals  Care Tool:  Bathing  Bathing activity did not occur: Refused Body parts bathed by patient: Right arm, Left arm, Chest, Abdomen, Front perineal area, Right upper leg, Left upper leg, Right lower leg, Face, Left lower leg, Buttocks   Body parts bathed by helper: Buttocks     Bathing assist Assist Level: Supervision/Verbal cueing     Upper Body Dressing/Undressing Upper body dressing Upper body dressing/undressing activity did not occur (including orthotics): Refused (pt reported he had just changed shirt prior to evaluation) What is the patient wearing?: Pull over shirt    Upper body assist Assist Level: Independent    Lower Body Dressing/Undressing Lower body dressing  What is the patient wearing?: Pants, Underwear/pull up     Lower body assist Assist for lower body dressing: Supervision/Verbal cueing     Toileting Toileting Toileting Activity did not occur (Clothing management and hygiene only): Refused  Toileting assist Assist for toileting: Supervision/Verbal cueing     Transfers Chair/bed transfer  Transfers assist  Chair/bed transfer activity did not occur: Refused  Chair/bed transfer assist level: Supervision/Verbal cueing     Locomotion Ambulation   Ambulation assist   Ambulation activity did not occur: Safety/medical concerns (Fatigue, pain)  Assist level: Supervision/Verbal cueing Assistive device: Walker-rolling Max distance: 165   Walk 10 feet activity   Assist  Walk 10 feet  activity did not occur: Safety/medical concerns (Fatigue, pain)  Assist level: Supervision/Verbal cueing Assistive device: Walker-rolling   Walk 50 feet activity   Assist Walk 50 feet with 2 turns activity did not occur: Safety/medical concerns (Fatigue, pain)  Assist level: Supervision/Verbal cueing Assistive device: Walker-rolling    Walk 150 feet activity   Assist Walk 150 feet activity did not occur: Safety/medical concerns  Assist level: Supervision/Verbal cueing Assistive device: Walker-rolling    Walk 10 feet on uneven surface  activity   Assist Walk 10 feet on uneven surfaces activity did not occur: Safety/medical concerns (Fatigue, pain)         Wheelchair     Assist Is the patient using a wheelchair?: Yes Type of Wheelchair: Manual    Wheelchair assist level: Supervision/Verbal cueing Max wheelchair distance: 30    Wheelchair 50 feet with 2 turns activity    Assist        Assist Level: Supervision/Verbal cueing   Wheelchair 150 feet activity     Assist      Assist Level: Dependent - Patient 0%   Blood pressure 133/77, pulse 61, temperature 97.7 F (36.5 C), resp. rate 16, height  (1.803 m), weight (!) 138.3 kg, SpO2 98 %.    Medical Problem List and Plan: 1. 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 IVIG x 5 days completed.             -patient may shower             -ELOS/Goals: minA 14-18 days           - D/c 05/09/21  Continue CIR- PT and OT- great response to steroid injections of R shoulder- doesn't appear will need to extend his rehab stay-   Continue CIR- PT, OT -D/C TOMORROW- will need f/u with me. Needs f/u with me Thrombotics: -DVT/anticoagulation:  Pharmaceutical: Lovenox  2/7- will not go home on Lovenox-             -antiplatelet therapy: none 3. Left thumb pain: Ice 15 minutes TID.  Tylenol. Spreading to right hand as well- appears to be his rheumatoid arthritis. Added voltaren gel. Increase prednisone to . Consider consulting his rheumatologist regarding potential treatment options  1/23- L/m for pt's Rheum about his prednisone/MTX- refused MTX last week when ordered, of note. 1/24- changed MTX to start today 17.5 mg qweek; got OK from Rheum; also increased Prednisone to his home dose of 10 mg daily.   1/28- pain under reasonable control at present 1/30- will add tramadol 50 mg BID prn for back pain- has chronic pain AND bed is really hurting his back. 1/31- forgot to ask for tramadol- con't regimen and monitor- asked him to  just ask for pain meds prn.  2/1- took tramadol this AM- hadn't kicked in yet- will con't tramadol but increase to q6 hours prn   2/2- tramadol didn't help- will change to norco and d/c tramadol since sedating but not helpful- will do steroid injection of R shoulder today per pt request.  2/3- pain MUCH better- hasn't gotten Norco since late last night- con't regimen-  2/6- pain MUCH better- con't norco while here prn; but won't need at d/c-  2/7- pain controlled- con't regimen- won't need pain meds at d/c I don't think.  4. Mood: LCSW to evlauate and provide emotional support             -antipsychotic agents: n/a 5. Neuropsych: This patient is capable of making decisions on his own behalf. 6. Skin/Wound Care: Routine skin checks 7. Fluids/Electrolytes/Nutrition: Routine ins and outs and follow-up chemistries 8.  Peripheral polyneuropathy: Continue gabapentin 1/30- explained this is why had col feeling of feet- but wondering how GBS plays a role in changing sensation as well. 2/6- still numb from feet to knees B/L   9.  Obstructive sleep apnea: Continue CPAP at night time and while napping 10: Morbid obesity: Discussed benefits of Mediterranean diet   1/24- BMI 41.82- con't reduction in calories  1/31- BMI up to 42.52- is stable to slightly up.   11: Restless leg syndrome: Continue Mirapex  1/24- will change to 2pm and 7pm per his home dose  2/1- working much better- con't regimen 12: Benign prostatic hypertrophy: Continue Flomax 13: Muscle spasm: Robaxin prn (was getting Valium on acute side)  1/23- increased robaxin to 2x/day at 3pm and 9pm per pt request and BID prn increased to 1000 mg- max 4x/day.  2/3- spasms doing grea-t con't regimen 14: Vitamin B12 deficiency: continue oral supplement  15: RA- Arthritis flare both hands: started on prednisone then weaned off; methotrexate home med held  1/24- restarted MTX q week today and increased prednisone to home dose 10 mg daily.   1/31- still having AM stiffness, but better. Con't regimen  2/6- stiffness stable- con't regimen 16: Overactive bladder: continue Vibegron 17: Loose stools versus constipation: continue to monitor>>Imodium PRN/anti-constipation measures. Added prn Dulxolax.   2/3- going regularly- con't regimen 18: History of Vitamin D deficiency: check serum level  1/24- Vit D level 44.45 19: Mild thrombocytopenia: follow-up CBC  1/23- plts down slightly to 136k- will recheck in AM  1/31- Plts up to 240k- con't to monitor 20. Left knee pain: ice 15 minutes TID. Messaged ortho PA to see if he can get knee aspiration again but pain has improved- can likely defer to later in the week, discussed with ortho PA.   1/23- has already d/w ortho- they don't want to take more fluid off L knee right now-   2/3-4: pain controlled- swelling better- continue regimen 21. Low back pain, chronic: continue kpad. 22. Cold feet/neuropathy  1/28 discussed with patient that circulation appeared to be adequate in both feet. "Cold" sensation is more related to his neuropathy.Mild color difference might be related to neurological factors, positioning, etc.   22. Leukopenia  1/27- Down to 3.7- not sure cause- just under normal- will recheck Monday and could be due to prednisone?  1/30- back to  4.2- will monitor weekly.   2/6- WBC 6.1- stable 23. R shoulder pain/RTC inflammation?  2/1- spoke with Dr Lajoyce Cornersuda- we agreed that MRI would be needed if thought infectious, but don't see a reason for this- will try  increasing his prednisone to 20 mg daily x 4 days and also increase tramadol to q6 hours prn for pain- and see if that helps. Wait to do injection for now.   2/2- pain hasn't improved too much- even though icing and using heat as well- will do steroid injection of R RTC/and R subacromial steroid injection  2/3- will change Prednisone to 10 mg daily as of tomorrow, since had such a good response to steroid injections- so won't go until Sunday like had planned  2/4: continues to have good response, continue to ice.   2/7- pt reports shoulder pain doing great- con't regimen    I spent a total of  36  minutes on total care today- >50% coordination of care- due to discussing education on lovenox; d/c plans and pain control    LOS: 18 days A FACE TO FACE EVALUATION WAS PERFORMED  Tony Mcintosh 05/08/2021, 9:22 AM

## 2021-05-08 NOTE — Progress Notes (Signed)
Occupational Therapy Discharge Summary  Patient Details  Name: Tony Mcintosh MRN: [id number] Date of Birth: [birth year]  Patient has met 8 of 8 long term goals due to improved activity tolerance, improved balance, postural control, ability to compensate for deficits, functional use of  RIGHT lower and LEFT lower extremity, and improved coordination.  Pt made excellent progress with BADLs and functional transfers during this admission. Pt completes bathing with supervision at shower level. Dressing with supervision using AE appropriately. All functional transfers with supervision using RW. Standing balance with supervisin. TTB tranfsers with supervision. Toileting with supervision. [name] has participated in therapy sessions and has verbalized understanding of recommendation of 24 hour supervision. Patient to discharge at overall Supervision level.  Patient's care partner is independent to provide the necessary physical assistance at discharge.     Recommendation:  Patient will benefit from ongoing skilled OT services in home health setting to continue to advance functional skills in the area of BADL and Reduce care partner burden.  Equipment: BSC and TTB through [facility]  Reasons for discharge: treatment goals met and discharge from hospital  Patient/family agrees with progress made and goals achieved: Yes  OT Discharge Vision Baseline Vision/History: 1 Wears glasses Patient Visual Report: No change from baseline Vision Assessment?: No apparent visual deficits Perception  Perception: Within Functional Limits Praxis Praxis: Intact Cognition Overall Cognitive Status: Within Functional Limits for tasks assessed Arousal/Alertness: Awake/alert Orientation Level: Oriented X4 Year: 2023 Month: February Day of Week: Correct Attention: Sustained Sustained Attention: Appears intact Memory: Appears intact Immediate Memory Recall: Sock;Bed;Blue Memory Recall Sock: Without Cue Memory  Recall Blue: Without Cue Memory Recall Bed: Without Cue Awareness: Appears intact Problem Solving: Appears intact Safety/Judgment: Appears intact Sensation Sensation Light Touch: Appears Intact Peripheral sensation comments: Chronic peripheral neuropathy, reports numbness biltaerally distal to knees Hot/Cold: Appears Intact Proprioception: Appears Intact Stereognosis: Appears Intact Additional Comments: BUE appears intact, however reports of previous impairments with sensation. Coordination Gross Motor Movements are Fluid and Coordinated: Yes Fine Motor Movements are Fluid and Coordinated: Yes 9 Hole Peg Test: WNL Motor  Motor Motor: Abnormal postural alignment and control Motor - Skilled Clinical Observations: Impaired by decreased strength and fear of falling Trunk/Postural Assessment  Cervical Assessment Cervical Assessment: Exceptions to [facility] (forward head) Thoracic Assessment Thoracic Assessment: Exceptions to [facility] (rounded shoulders) Lumbar Assessment Lumbar Assessment: Exceptions to [facility] (posterior pelvic tilt)  Balance Static Sitting Balance Static Sitting - Balance Support: Feet supported Static Sitting - Level of Assistance: 6: Modified independent (Device/Increase time) Dynamic Sitting Balance Dynamic Sitting - Balance Support: During functional activity Dynamic Sitting - Level of Assistance: 6: Modified independent (Device/Increase time) Extremity/Trunk Assessment RUE Assessment RUE Assessment: Within Functional Limits General Strength Comments: 4/5 overall LUE Assessment LUE Assessment: Within Functional Limits General Strength Comments: 4/5 overall, 4-/5 grip strength   Leroy Libman 05/08/2021, 6:44 AM

## 2021-05-09 DIAGNOSIS — G610 Guillain-Barre syndrome: Secondary | ICD-10-CM

## 2021-05-09 MED ORDER — VITAMIN D (ERGOCALCIFEROL) 1.25 MG (50000 UNIT) PO CAPS: 50000.0000 [IU] | ORAL_CAPSULE | ORAL | 0 refills | Status: AC

## 2021-05-09 MED ORDER — FOLIC ACID 1 MG PO TABS: 1.0000 mg | ORAL_TABLET | Freq: Every day | ORAL | 0 refills | Status: AC

## 2021-05-09 MED ORDER — ACETAMINOPHEN 325 MG PO TABS: 325.0000 mg | ORAL_TABLET | ORAL | Status: AC | PRN

## 2021-05-09 MED ORDER — BISACODYL 5 MG PO TBEC
5.0000 mg | DELAYED_RELEASE_TABLET | Freq: Every day | ORAL | 0 refills | Status: AC | PRN
Start: 2021-05-09 — End: ?

## 2021-05-09 MED ORDER — B COMPLEX VITAMINS PO CAPS: 1.0000 | ORAL_CAPSULE | Freq: Two times a day (BID) | ORAL | 0 refills | Status: AC

## 2021-05-09 MED ORDER — MAGNESIUM GLUCONATE 500 MG PO TABS: 250.0000 mg | ORAL_TABLET | Freq: Every day | ORAL | 0 refills | Status: AC

## 2021-05-09 MED ORDER — METHOCARBAMOL 1000 MG PO TABS: 1000.0000 mg | ORAL_TABLET | Freq: Four times a day (QID) | ORAL | 0 refills | Status: DC | PRN

## 2021-05-09 MED ORDER — TAMSULOSIN HCL 0.4 MG PO CAPS: 0.8000 mg | ORAL_CAPSULE | Freq: Every day | ORAL | 0 refills | Status: AC

## 2021-05-09 MED ORDER — RISAQUAD PO CAPS: 1.0000 | ORAL_CAPSULE | Freq: Every day | ORAL | 0 refills | Status: AC

## 2021-05-09 MED ORDER — DICLOFENAC SODIUM 1 % EX GEL: 2.0000 g | Freq: Four times a day (QID) | CUTANEOUS | 0 refills | Status: AC

## 2021-05-09 MED ORDER — HYDROCODONE-ACETAMINOPHEN 5-325 MG PO TABS: 1.0000 | ORAL_TABLET | Freq: Four times a day (QID) | ORAL | 0 refills | Status: AC | PRN

## 2021-05-09 MED ORDER — PREDNISONE 10 MG PO TABS: 10.0000 mg | ORAL_TABLET | Freq: Every day | ORAL | 0 refills | Status: AC

## 2021-05-09 MED ORDER — CALCIUM CARBONATE 1250 (500 CA) MG PO TABS: 1.0000 | ORAL_TABLET | Freq: Two times a day (BID) | ORAL | 0 refills | Status: AC

## 2021-05-09 MED ORDER — TRAZODONE HCL 50 MG PO TABS: 50.0000 mg | ORAL_TABLET | Freq: Every day | ORAL | 0 refills | Status: AC

## 2021-05-09 NOTE — Progress Notes (Signed)
PROGRESS NOTE   Subjective/Complaints:  Ready for d/c today- no issues Needs ot have PM&R f/u scheduled by d/c-   ROS:   Pt denies SOB, abd pain, CP, N/V/C/D, and vision changes   Objective:   No results found. Recent Labs    05/07/21 0637  WBC 6.1  HGB 13.0  HCT 38.2*  PLT 284     Recent Labs    05/07/21 0637  NA 135  K 4.0  CL 101  CO2 26  GLUCOSE 106*  BUN 15  CREATININE 0.99  CALCIUM 8.8*     Intake/Output Summary (Last 24 hours) at 05/09/2021 0800 Last data filed at 05/08/2021 1816 Gross per 24 hour  Intake 480 ml  Output --  Net 480 ml        Physical Exam: Vital Signs Blood pressure 132/82, pulse 68, temperature 97.8 F (36.6 C), temperature source Oral, resp. rate 18, height 5\' 11"  (1.803 m), weight (!) 138.3 kg, SpO2 97 %.     General: awake, alert, appropriate, BMI  42.5; NAD HENT: conjugate gaze; oropharynx moist CV: regular rate; no JVD Pulmonary: CTA B/L; no W/R/R- good air movement GI: soft, NT, ND, (+)BS Psychiatric: appropriate Neurological: Ox3 Musculoskeletal:  R shoulder much less TTP as well as able to lift R arm above head with flexion and abduction.  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     Cervical back: Rigidity present. 5/5 strength with the exception of LLE 4/5- greatly improved as knee swelling is notable- moderate esp suprapatellar, however nothing to compare it to. Left leg with less muscle girth than right side Neurological:     Mental Status: He is alert and oriented to person, place, and time.  Skin/ext::right foot with a little more blood pooling than left. Both feet warm to touch  with norma cap refill   Assessment/Plan: 1. Functional deficits which require 3+ hours per day of interdisciplinary therapy in a comprehensive inpatient rehab setting. Physiatrist is providing close team supervision and 24 hour management of active medical problems listed below. Physiatrist and rehab team continue to assess barriers to discharge/monitor patient progress toward functional and medical goals  Care Tool:  Bathing  Bathing activity did not occur: Refused Body parts bathed by patient: Right arm, Left arm, Chest, Abdomen, Front perineal area, Right upper leg, Left upper leg, Right lower leg, Face, Left lower leg, Buttocks   Body parts bathed by helper: Buttocks     Bathing assist Assist Level: Supervision/Verbal cueing     Upper Body Dressing/Undressing Upper body dressing Upper body dressing/undressing activity did not occur (including orthotics): Refused (pt reported he had just changed shirt prior to evaluation) What is the patient wearing?: Pull over shirt    Upper body assist Assist Level: Independent    Lower Body Dressing/Undressing Lower body dressing      What is the patient wearing?: Pants, Underwear/pull  up     Lower body assist Assist for lower body dressing: Supervision/Verbal cueing     Toileting Toileting Toileting Activity did not occur Landscape architect and hygiene only): Refused  Toileting assist Assist for toileting: Supervision/Verbal cueing     Transfers Chair/bed transfer  Transfers assist  Chair/bed transfer activity did not occur: Refused  Chair/bed transfer assist level: Supervision/Verbal cueing     Locomotion Ambulation   Ambulation assist   Ambulation activity did not occur: Safety/medical concerns (Fatigue, pain)  Assist level: Supervision/Verbal cueing Assistive device: Walker-rolling Max distance: 165   Walk 10 feet activity   Assist  Walk 10 feet activity did not occur: Safety/medical concerns (Fatigue,  pain)  Assist level: Supervision/Verbal cueing Assistive device: Walker-rolling   Walk 50 feet activity   Assist Walk 50 feet with 2 turns activity did not occur: Safety/medical concerns (Fatigue, pain)  Assist level: Supervision/Verbal cueing Assistive device: Walker-rolling    Walk 150 feet activity   Assist Walk 150 feet activity did not occur: Safety/medical concerns  Assist level: Supervision/Verbal cueing Assistive device: Walker-rolling    Walk 10 feet on uneven surface  activity   Assist Walk 10 feet on uneven surfaces activity did not occur: Safety/medical concerns (Fatigue, pain)   Assist level: Contact Guard/Touching assist Assistive device: Walker-rolling   Wheelchair     Assist Is the patient using a wheelchair?: No Type of Wheelchair: Manual    Wheelchair assist level: Supervision/Verbal cueing Max wheelchair distance: 30    Wheelchair 50 feet with 2 turns activity    Assist        Assist Level: Supervision/Verbal cueing   Wheelchair 150 feet activity     Assist      Assist Level: Total Assistance - Patient < 25%   Blood pressure 132/82, pulse 68, temperature 97.8 F (36.6 C), temperature source Oral, resp. rate 18, height 5\' 11"  (1.803 m), weight (!) 138.3 kg, SpO2 97 %.    Medical Problem List and Plan: 1. 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 IVIG x 5 days completed.             -patient may shower             -ELOS/Goals: minA 14-18 days           - D/c 05/09/21  Continue CIR- PT and OT- great response to steroid injections of R shoulder- doesn't appear will need to extend his rehab stay-   Continue CIR- PT, OT -D/C TOMORROW- will need f/u with me. Needs f/u with me  D/c today - will schedule f/u with me-  Thrombotics: -DVT/anticoagulation:  Pharmaceutical: Lovenox  2/7- will not go home on  Lovenox-             -antiplatelet therapy: none 3. Left thumb pain: Ice 15 minutes TID. Tylenol. Spreading to right hand as well- appears to be his rheumatoid arthritis. Added voltaren gel. Increase prednisone to 5mg . Consider consulting his rheumatologist regarding potential treatment options  1/23- L/m for pt's Rheum about his prednisone/MTX- refused MTX last week when ordered, of note. 1/24- changed MTX to start today 17.5 mg qweek; got OK from Rheum; also increased Prednisone to his home dose of 10 mg daily.   1/28- pain under reasonable control at present 1/30- will add tramadol 50 mg BID prn for back pain- has chronic pain AND bed is really hurting his back. 1/31-  forgot to ask for tramadol- con't regimen and monitor- asked him to just ask for pain meds prn.  2/1- took tramadol this AM- hadn't kicked in yet- will con't tramadol but increase to q6 hours prn   2/2- tramadol didn't help- will change to norco and d/c tramadol since sedating but not helpful- will do steroid injection of R shoulder today per pt request.  2/3- pain MUCH better- hasn't gotten Norco since late last night- con't regimen-  2/6- pain MUCH better- con't norco while here prn; but won't need at d/c-  2/7- pain controlled- con't regimen- won't need pain meds at d/c I don't think.  4. Mood: LCSW to evlauate and provide emotional support             -antipsychotic agents: n/a 5. Neuropsych: This patient is capable of making decisions on his own behalf. 6. Skin/Wound Care: Routine skin checks 7. Fluids/Electrolytes/Nutrition: Routine ins and outs and follow-up chemistries 8.  Peripheral polyneuropathy: Continue gabapentin 1/30- explained this is why had col feeling of feet- but wondering how GBS plays a role in changing sensation as well. 2/6- still numb from feet to knees B/L   9.  Obstructive sleep apnea: Continue CPAP at night time and while napping 10: Morbid obesity: Discussed benefits of Mediterranean diet   1/24-  BMI 41.82- con't reduction in calories  1/31- BMI up to 42.52- is stable to slightly up. 2/8- weight stable- counseling given  11: Restless leg syndrome: Continue Mirapex  1/24- will change to 2pm and 7pm per his home dose  2/1- working much better- con't regimen 12: Benign prostatic hypertrophy: Continue Flomax 13: Muscle spasm: Robaxin prn (was getting Valium on acute side)  1/23- increased robaxin to 2x/day at 3pm and 9pm per pt request and BID prn increased to 1000 mg- max 4x/day.  2/3- spasms doing grea-t con't regimen 14: Vitamin B12 deficiency: continue oral supplement  15: RA- Arthritis flare both hands: started on prednisone then weaned off; methotrexate home med held  1/24- restarted MTX q week today and increased prednisone to home dose 10 mg daily.   1/31- still having AM stiffness, but better. Con't regimen  2/6- stiffness stable- con't regimen 16: Overactive bladder: continue Vibegron 17: Loose stools versus constipation: continue to monitor>>Imodium PRN/anti-constipation measures. Added prn Dulxolax.   2/3- going regularly- con't regimen 18: History of Vitamin D deficiency: check serum level  1/24- Vit D level 44.45 19: Mild thrombocytopenia: follow-up CBC  1/23- plts down slightly to 136k- will recheck in AM  1/31- Plts up to 240k- con't to monitor 20. Left knee pain: ice 15 minutes TID. Messaged ortho PA to see if he can get knee aspiration again but pain has improved- can likely defer to later in the week, discussed with ortho PA.   1/23- has already d/w ortho- they don't want to take more fluid off L knee right now-   2/3-4: pain controlled- swelling better- continue regimen 21. Low back pain, chronic: continue kpad. 22. Cold feet/neuropathy  1/28 discussed with patient that circulation appeared to be adequate in both feet. "Cold" sensation is more related to his neuropathy.Mild color difference might be related to neurological factors, positioning, etc.   22.  Leukopenia  1/27- Down to 3.7- not sure cause- just under normal- will recheck Monday and could be due to prednisone?  1/30- back to 4.2- will monitor weekly.   2/6- WBC 6.1- stable 23. R shoulder pain/RTC inflammation?  2/1- spoke with Dr Sharol Given- we agreed  that MRI would be needed if thought infectious, but don't see a reason for this- will try increasing his prednisone to 20 mg daily x 4 days and also increase tramadol to q6 hours prn for pain- and see if that helps. Wait to do injection for now.   2/2- pain hasn't improved too much- even though icing and using heat as well- will do steroid injection of R RTC/and R subacromial steroid injection  2/3- will change Prednisone to 10 mg daily as of tomorrow, since had such a good response to steroid injections- so won't go until Sunday like had planned  2/4: continues to have good response, continue to ice.   2/7- pt reports shoulder pain doing great- con't regimen  Will not go home on Lovenox- won't need pain meds at d/c.      LOS: 19 days A FACE TO FACE EVALUATION WAS PERFORMED  Khylan Sawyer 05/09/2021, 8:00 AM

## 2021-05-09 NOTE — Progress Notes (Signed)
Inpatient Rehabilitation Care Coordinator Discharge Note   Patient Details  Name: Tony Mcintosh MRN: [id number] Date of Birth: 05-22-1945   Discharge location: D/c to to home with his wife. Sons will be home initially at d/c.  Length of Stay: 18 days  Discharge activity level: Supervision  Home/community participation: Limited  Patient response ZJ:QBHALP Literacy - How often do you need to have someone help you when you read instructions, pamphlets, or other written material from your doctor or pharmacy?: Rarely  Patient response FX:TKWIOX Isolation - How often do you feel lonely or isolated from those around you?: Never  Services provided included: MD, RD, PT, OT, SLP, RN, CM, Pharmacy, Neuropsych, SW, TR  Financial Services:  Field seismologist Utilized: Dealer (primary), Medicare A/B, and [employer] Insurance offered to/list presented to: yes  Follow-up services arranged:  DME, Home Health Home Health Agency: Diplomatic Services operational officer at Home for HHPT/OT/aide (arranged by [place])    DME : bariatric RW, 3in1 BSC, and TTB delivered to home ([facility]anged by [facility])    Patient response to transportation need: Is the patient able to respond to transportation needs?: Yes (Will use [facility] transportation for future appointments if needed) In the past 12 months, has lack of transportation kept you from medical appointments or from getting medications?: No In the past 12 months, has lack of transportation kept you from meetings, work, or from getting things needed for daily living?: No  Comments (or additional information):  Patient/Family verbalized understanding of follow-up arrangements:  Yes  Individual responsible for coordination of the follow-up plan: contact pt [phone] or pt wife Tony Mcintosh [phone]  Confirmed correct DME delivered: Tony Mcintosh 05/09/2021    Tony Mcintosh

## 2021-05-09 NOTE — Progress Notes (Signed)
Patient ID: [name], male   DOB: July 05, 1945, 76 y.o.   MRN: [id number]  SW faxed d/c summary to patient [facility] provider- [name] ([phone]/f:[phone]) for them to call and schedule post follow-up appointment.   SW sent new script for upcoming appointment with Dr. Berline ChoughLovorn in the [facility] to pt assigned [facility] SW- [name].   *For future appointments, pt will need to follow-up with VA SW- [name] atleast one week in advance.  [name], MSW, LCSWA Office: [phone] Cell: [phone] Fax: [phone]

## 2021-05-09 NOTE — Progress Notes (Signed)
Inpatient Rehabilitation Discharge Medication Review by a Pharmacist  A complete drug regimen review was completed for this patient to identify any potential clinically significant medication issues.  High Risk Drug Classes Is patient taking? Indication by Medication  Antipsychotic No   Anticoagulant No   Antibiotic No   Opioid Yes Hydrocodone for chronic pain syndrome  Antiplatelet No   Hypoglycemics/insulin No   Vasoactive Medication No   Chemotherapy Yes MtX for RA  Other Yes Prednisone for RA     Clinically significant medication issues were identified that warrant physician communication and completion of prescribed/recommended actions by midnight of the next day:  No   Time spent performing this drug regimen review (minutes):  10 min  Ajdin Macke S. Merilynn Finlandobertson, PharmD, BCPS Clinical Staff Pharmacist [email or web address] Pasty SpillersRobertson, Caroll Weinheimer Stillinger 05/09/2021 8:19 AM

## 2021-05-10 ENCOUNTER — Telehealth: Payer: Self-pay | Admitting: *Deleted

## 2021-05-10 MED ORDER — METHOCARBAMOL 500 MG PO TABS: 1000.0000 mg | ORAL_TABLET | Freq: Four times a day (QID) | ORAL | 0 refills | Status: AC

## 2021-05-10 NOTE — Telephone Encounter (Signed)
Transitional Care call--I spoke with son Tony Mcintosh who called about medications they were unable to get.    Are you/is patient experiencing any problems since coming home? Are there any questions regarding any aspect of care?There are two medications they have been unable to get from pharmacy because of needing prior auths. Methocarbamol and probiotic. The methocarbamol was going to cost $3000 due to 1000 mg tablet dose. I have changed it to 500 mg (2 tablets) qid and the cost dropped to $5 copay. A prior Berkley Harvey will be initiated for the probiotic. Are there any questions regarding medications administration/dosing? Are meds being taken as prescribed? Patient should review meds with caller to confirm Yes see above Have there been any falls? NO Has Home Health been to the house and/or have they contacted you? If not, have you tried to contact them? Can we help you contact them? Yes they have made contact and been out to the home already Are bowels and bladder emptying properly? Are there any unexpected incontinence issues? If applicable, is patient following bowel/bladder programs? NO PROBLEM Any fevers, problems with breathing, unexpected pain? NO PROBLEM Are there any skin problems or new areas of breakdown? NO Has the patient/family member arranged specialty MD follow up (ie cardiology/neurology/renal/surgical/etc)?  Can we help arrange? IN PROCESS. They have appt scheduled for our office. Does the patient need any other services or support that we can help arrange? NO Are caregivers following through as expected in assisting the patient? YES Has the patient quit smoking, drinking alcohol, or using drugs as recommended? N/A  Appointment 05/21/21 Monday with Jacalyn Lefevre NP arrive by 9 am Alerted to watch mail for packet from our office with paperwork to be filled out. Address reviewed [address] [phone]

## 2021-05-18 ENCOUNTER — Telehealth: Payer: Self-pay | Admitting: *Deleted

## 2021-05-18 NOTE — Telephone Encounter (Signed)
I have spoke with Dr. Berline Chough about him being in hospital in [place] with blood clot in heart.  She is trying to get patient transferred back to [facility] and wanted Dr. Dahlia Client help.  The best thing for patient is to let them know at [facility] to have him transferred back to [facility].  Wife understood and also canceled his hospital f/u appt with her on Monday.

## 2021-05-18 NOTE — Telephone Encounter (Signed)
I spoke to April who let me know- I explained I don't have a way to get him into ED here- or direct admit-  However I'm so sorry to hear what's going on.  Thanks for informing me- Take care- ML

## 2021-05-18 NOTE — Telephone Encounter (Signed)
I left Mrs Heydon a message conveying Dr Imelda Pillow response.

## 2021-05-18 NOTE — Telephone Encounter (Signed)
I spoke with Tony Mcintosh and she rsays that Tony Mcintosh has relayed that he does not meet the criteria to transfer him here and break up the clot and they have reached out to [facility] but hospitalist feels they will have the same response. For now he is going to be admitted and treated with heparin until it is stabilized and absorbed so that he can return home. Tony Mcintosh wants Dr Berline Chough that she appreciates all she has done for him.

## 2021-05-21 ENCOUNTER — Encounter: Payer: Medicare Other | Admitting: Registered Nurse

## 2021-07-30 ENCOUNTER — Encounter: Payer: Self-pay | Admitting: Physical Medicine and Rehabilitation

## 2021-07-30 ENCOUNTER — Encounter: Payer: Medicare Other | Attending: Registered Nurse | Admitting: Physical Medicine and Rehabilitation

## 2021-07-30 VITALS — BP 122/81 | HR 67 | Ht 71.0 in | Wt 296.0 lb

## 2021-07-30 DIAGNOSIS — R29898 Other symptoms and signs involving the musculoskeletal system: Secondary | ICD-10-CM | POA: Insufficient documentation

## 2021-07-30 DIAGNOSIS — G610 Guillain-Barre syndrome: Secondary | ICD-10-CM | POA: Diagnosis present

## 2021-07-30 DIAGNOSIS — M545 Low back pain, unspecified: Secondary | ICD-10-CM | POA: Diagnosis present

## 2021-07-30 DIAGNOSIS — G6289 Other specified polyneuropathies: Secondary | ICD-10-CM | POA: Diagnosis present

## 2021-07-30 DIAGNOSIS — G8929 Other chronic pain: Secondary | ICD-10-CM | POA: Diagnosis present

## 2021-07-30 DIAGNOSIS — M21372 Foot drop, left foot: Secondary | ICD-10-CM | POA: Insufficient documentation

## 2021-07-30 NOTE — Patient Instructions (Signed)
MS: ?RUE- deltoid 5-/5; biceps 4+/5; WE 4+/5; grip 4/5; FA 3+/5 ?LUE- deltoid 4+/5; biceps 4/5; WE  4/5; grip 4-/5; FA 3-/5 ? ?RLE- HF 4+/5; KE/KF 4/5; DF and PF 4/5 ?LLE- HF 2 to 2-/5; KE 3-/5; KF 2/5 DF 3+/5; PF 3+/5 ? ?Sensation: absent from L knee to toes ?Decreased L thigh ?Slightly decreased LT RLE ?Ue's decreased C4-T1 B/L - RUE slightly better than LUE.  ? ? ?   ?Assessment & Plan:  ? ?Pt is a 76 yr old male with Guillain Barre Syndrome; Also has RA; peripheral polyneuropathy, OSA; morbid obesity; L knee pain;  ? ?Here for hospital f/u on GBS.  ? ? ?The earliest can stop Eliquis is 08/14/21. However I think would continue a minimum of 6 months of Eliquis.  ?I personally wouldn't stop it til at least 6 months, because has a Neurological cause of DVT's. And also still has LLE swelling.  ? ?2. Suggest Jossie Ng Neurologist get EMG/NCS at [facility] to make sure has AIDP/GBS not CIDP. Think this could be helpful for prognosis.  ? ? ?3. We discussed Plaquenil- can affect eyes-I don't prescribe it, but usually the first 2-3 years are OK- but needs eye appt every 6 months while on medicine.  Would need Ophthalmologist.  ? ? ?4. Cannot use pedal bike on LLE- can use on arms- using daily- need to get 30 minutes minimum  ?Needs to either reduce input or increase output.  ?So either reduce calorie intake or increase exercise.  ? ?5. Last kidney function is good.  ? ?6. [facility] gives him Norco mainly at night- so usually 1-2x/day at most. So I cannot prescribe as well- since already getting from [place].  ? ?7.  Can take the Methocarbamol- muscle spasms- can either take AS NEEDED- or  take nightly as needed-  ? ?8.  F/U in 3 months-double visit- GBS ?  ?

## 2021-07-30 NOTE — Progress Notes (Signed)
? ?Subjective:  ? ? Patient ID: Tony Mcintosh, male    DOB: Oct 03, 1945, 76 y.o.   MRN: [id number] ? ?HPI ? ?Pt is a 76 yr old male with Guillain Barre Syndrome; Also has RA; peripheral polyneuropathy, OSA; morbid obesity; L knee pain;  ? ?Here for hospital f/u on GBS.  ? ?Got blood clots- LLE- knee to ankle- with LLE edema/swelling-  ?Put on Eliquis.  ? ?Both his parents died from strokes.  ?Was told by Hematology- that they want to stop Eliquis at 3 months and "see how it works/if things get better".  ? 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 ? ?Is going to [employer]  as well as a GBS specialist/Neuro at [facility] as well.  ? ? ?Now cannot walk as easily, since PE/DVT.  ?Can walk to bathroom with RW and back.  ?Holds onto workshop bench to walk around- "sapped the strength out of him". No SOB/DOE when walks; just feels weaker.  ? ?Never got EMG/NCS for GBS.  ? ?Will try to get him to get them.  ? ?Had eye exam in December- needs more prism- if reads 5-10 minutes, and gets blurry.  ?Eyes now also getting dry- which is new due to GBS.  ? ?UB sensation is OK- but palm of L hand comes and goes; R palm not quite there; trouble gripping things and drops things regularly.  ?Torso- ribcage down sensation is still numb around waist- esp around backside.  ?Butt- cannot feel underwear- if adjusted right.  ?RLE sensation is doing pretty well; LLE- knee to L foot, completely absent and also decreased in L thigh.  ? ?Cannot turn over in bed with LLE.  ? ?L knee not swelling like was in hospital, but bottom of feet- L>>R and up to mid calf B/L L>>R- swelling ? ?Also had hx of neuropathy with decreased sensation in feet B/L prior to GBS.  ? ?Taken off MTX- Rheum- hydroxychlorquine- wants to put him on-  ? ?Borderline DM ? ? ?Back pain is worse- since numbness has gotten better. ?Concerned that gaining weight/can't lose weight.  ? ? ? ?Pain Inventory ?Average Pain 4 ?Pain Right Now  2 ?My pain is sharp, burning, tingling, and aching ? ?In the last 24 hours, has pain interfered with the following? ?General activity 9 ?Relation with others 8 ?Enjoyment of life 10 ?What TIME of day is your pain at its worst? morning  ?Sleep (in general) Good ? ?Pain is worse with: walking, bending, standing, and some activites ?Pain improves with: rest, pacing activities, and medication ?Relief from Meds: 7 ? ?use a walker ?how many minutes can you walk? 1-2 ?ability to climb steps?  no ?do you drive?  yes ?use a wheelchair ?needs help with transfers ? ?retired ?I need assistance with the following:  dressing, bathing, meal prep, household duties, and shopping ? ?bladder control problems ?weakness ?numbness ?tingling ?trouble walking ? ?Hospital f/u ? ?Hospital f/u ? ? ? ?Family History  ?Problem Relation Age of Onset  ? Stroke Father   ? ?Social History  ? ?Socioeconomic History  ? Marital status: Married  ?  Spouse name: Not on file  ? Number of children: Not on file  ? Years of education: Not on file  ? Highest education level: Not on file  ?Occupational History  ? Not on file  ?Tobacco Use  ? Smoking status: Never  ? Smokeless tobacco: Not on file  ?  Substance and Sexual Activity  ? Alcohol use: Yes  ?  Comment: 1 beer/wk  ? Drug use: Not on file  ? Sexual activity: Not on file  ?Other Topics Concern  ? Not on file  ?Social History Narrative  ? Not on file  ? ?Social Determinants of Health  ? ?Financial Resource Strain: Not on file  ?Food Insecurity: Not on file  ?Transportation Needs: Not on file  ?Physical Activity: Not on file  ?Stress: Not on file  ?Social Connections: Not on file  ? ?Past Surgical History:  ?Procedure Laterality Date  ? BACK SURGERY  2011  ? CARPAL TUNNEL RELEASE    ? lumbar back surger    ? ?Past Medical History:  ?Diagnosis Date  ? Back complaints   ? BPH (benign prostatic hyperplasia)   ? Chronic back pain   ? Lumbar stenosis   ? Osteoporosis   ? Vitamin D deficiency   ? ?BP 122/81    Pulse 67   Ht 5\' 11"  (1.803 m)   Wt 296 lb (134.3 kg)   SpO2 97%   BMI 41.28 kg/m?  ? ?Opioid Risk Score:   ?Fall Risk Score:  `1 ? ?Depression screen PHQ 2/9 ? ? ?  07/30/2021  ?  1:20 PM  ?Depression screen PHQ 2/9  ?Decreased Interest 0  ?Down, Depressed, Hopeless 0  ?PHQ - 2 Score 0  ?Altered sleeping 0  ?Tired, decreased energy 2  ?Change in appetite 0  ?Feeling bad or failure about yourself  0  ?Trouble concentrating 0  ?Moving slowly or fidgety/restless 0  ?Suicidal thoughts 0  ?PHQ-9 Score 2  ?Difficult doing work/chores Somewhat difficult  ?  ? ? ?Review of Systems  ?Musculoskeletal:  Positive for back pain.  ?     Bilateral hand pain ?Left shin pain ?Bilateral toe pain  ?All other systems reviewed and are negative. ? ?   ?Objective:  ? Physical Exam ?Awake, alert, appropriate, in manual w/c; accompanied by wife, NAD ? ?MS: ?RUE- deltoid 5-/5; biceps 4+/5; WE 4+/5; grip 4/5; FA 3+/5 ?LUE- deltoid 4+/5; biceps 4/5; WE  4/5; grip 4-/5; FA 3-/5 ? ?RLE- HF 4+/5; KE/KF 4/5; DF and PF 4/5 ?LLE- HF 2 to 2-/5; KE 3-/5; KF 2/5 DF 3+/5; PF 3+/5 ? ?Sensation: absent from L knee to toes ?Decreased L thigh ?Slightly decreased LT RLE ?Ue's decreased C4-T1 B/L - RUE slightly better than LUE.  ? ? ?   ?Assessment & Plan:  ? ?Pt is a 76 yr old male with Guillain Barre Syndrome; Also has RA; peripheral polyneuropathy, OSA; morbid obesity; L knee pain;  ? ?Here for hospital f/u on GBS.  ? ? ?The earliest can stop Eliquis is 08/14/21. However I think would continue a minimum of 6 months of Eliquis.  ?I personally wouldn't stop it til at least 6 months, because has a Neurological cause of DVT's. And also still has LLE swelling.  ? ?2. Suggest Karlene Lineman Neurologist get EMG/NCS at [facility] to make sure has AIDP/GBS not CIDP. Think this could be helpful for prognosis.  ? ? ?3. We discussed Plaquenil- can affect eyes-I don't prescribe it, but usually the first 2-3 years are OK- but needs eye appt every 6 months while on medicine.   Would need Ophthalmologist.  ? ? ?4. Cannot use pedal bike on LLE- can use on arms- using daily- need to get 30 minutes minimum  ?Needs to either reduce input or increase output.  ?So either reduce calorie  intake or increase exercise.  ? ?5. Last kidney function is good.  ? ?6. [facility]- so usually 1-2x/day at most. So I cannot prescribe as well- since already getting from [facility].  ? ?7.  Can take the Methocarbamol- muscle spasms- can either take AS NEEDED- or  take nightly as needed-  ? ?8.  F/U in 3 months-double visit- GBS ?  ? ?I spent a total of 41   minutes on total care today- >50% coordination of care- due to discussion of Eliquis, pain meds and Plaquenil.  ? ?

## 2021-11-30 ENCOUNTER — Encounter: Payer: Medicare Other | Admitting: Physical Medicine and Rehabilitation

## 2023-12-11 ENCOUNTER — Telehealth (HOSPITAL_BASED_OUTPATIENT_CLINIC_OR_DEPARTMENT_OTHER): Payer: Self-pay

## 2023-12-11 NOTE — Telephone Encounter (Signed)
 I called the patient was able to connect with him.  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  His issues that he did not regain full function of his lower extremities and so we put him on long-term anticoagulation.  Given that he needs a prostate biopsy he can come off Eliquis 2 days prior to procedure and then resume it the day after the procedure.  I informed him of my recommendations and to discuss this further with his urologist.  I also gave him the number to the [facility] clinic in case he wants an appointment.

## 2023-12-11 NOTE — Telephone Encounter (Unsigned)
 Copied from CRM [id number]. Topic: Clinical - Medical Advice >> Dec 11, 2023  9:30 AM Isabell A wrote: Reason for CRM: Patient of Dr.Alghanim was told to call or send a message if he has any questions regarding his situation - he is scheduled for a prostate biopsy, he has some questions in regard to getting off the medication Apixaban - the doctor wants him off of it 5 days before & has some concerns about the embolism Dr.Alghanim treated.  Callback number: [phone]
